# Patient Record
Sex: Female | Born: 1960 | Race: White | Hispanic: No | Marital: Single | Smoking: Never smoker
Health system: Southern US, Community
[De-identification: ages and names within clinical notes are randomized; demographics above are authoritative.]

## PROBLEM LIST (undated history)

## (undated) ENCOUNTER — Emergency Department (HOSPITAL_COMMUNITY): Admission: EM | Discharge status: Absent without leave | Payer: BLUE CROSS/BLUE SHIELD

## (undated) DIAGNOSIS — E119 Type 2 diabetes mellitus without complications: Secondary | ICD-10-CM

## (undated) DIAGNOSIS — E7800 Pure hypercholesterolemia, unspecified: Secondary | ICD-10-CM

## (undated) DIAGNOSIS — R0602 Shortness of breath: Secondary | ICD-10-CM

## (undated) DIAGNOSIS — I10 Essential (primary) hypertension: Secondary | ICD-10-CM

## (undated) DIAGNOSIS — E669 Obesity, unspecified: Secondary | ICD-10-CM

## (undated) DIAGNOSIS — R51 Headache: Secondary | ICD-10-CM

## (undated) DIAGNOSIS — I499 Cardiac arrhythmia, unspecified: Secondary | ICD-10-CM

## (undated) DIAGNOSIS — E1169 Type 2 diabetes mellitus with other specified complication: Secondary | ICD-10-CM

## (undated) DIAGNOSIS — I209 Angina pectoris, unspecified: Secondary | ICD-10-CM

## (undated) DIAGNOSIS — K219 Gastro-esophageal reflux disease without esophagitis: Secondary | ICD-10-CM

## (undated) DIAGNOSIS — I809 Phlebitis and thrombophlebitis of unspecified site: Secondary | ICD-10-CM

## (undated) HISTORY — PX: SALPINGOOPHORECTOMY: SHX82

## (undated) HISTORY — PX: SALPINGECTOMY: SHX328

## (undated) HISTORY — PX: TOENAIL EXCISION: SHX183

---

## 1997-09-10 ENCOUNTER — Emergency Department (HOSPITAL_COMMUNITY): Admission: EM | Admit: 1997-09-10 | Discharge: 1997-09-10 | Payer: Self-pay | Admitting: Emergency Medicine

## 1997-10-23 ENCOUNTER — Other Ambulatory Visit: Admission: RE | Admit: 1997-10-23 | Discharge: 1997-10-23 | Payer: Self-pay | Admitting: Obstetrics and Gynecology

## 1997-10-30 ENCOUNTER — Ambulatory Visit (HOSPITAL_COMMUNITY): Admission: RE | Admit: 1997-10-30 | Discharge: 1997-10-30 | Payer: Self-pay | Admitting: Obstetrics and Gynecology

## 1999-04-22 ENCOUNTER — Other Ambulatory Visit: Admission: RE | Admit: 1999-04-22 | Discharge: 1999-04-22 | Payer: Self-pay | Admitting: Obstetrics and Gynecology

## 1999-10-27 ENCOUNTER — Emergency Department (HOSPITAL_COMMUNITY): Admission: EM | Admit: 1999-10-27 | Discharge: 1999-10-27 | Payer: Self-pay | Admitting: Emergency Medicine

## 2000-07-06 ENCOUNTER — Other Ambulatory Visit: Admission: RE | Admit: 2000-07-06 | Discharge: 2000-07-06 | Payer: Self-pay | Admitting: Obstetrics and Gynecology

## 2001-03-22 ENCOUNTER — Emergency Department (HOSPITAL_COMMUNITY): Admission: EM | Admit: 2001-03-22 | Discharge: 2001-03-22 | Payer: Self-pay | Admitting: Emergency Medicine

## 2001-03-22 ENCOUNTER — Encounter: Payer: Self-pay | Admitting: Emergency Medicine

## 2001-03-23 ENCOUNTER — Emergency Department (HOSPITAL_COMMUNITY): Admission: EM | Admit: 2001-03-23 | Discharge: 2001-03-23 | Payer: Self-pay | Admitting: Emergency Medicine

## 2001-03-26 ENCOUNTER — Emergency Department (HOSPITAL_COMMUNITY): Admission: EM | Admit: 2001-03-26 | Discharge: 2001-03-26 | Payer: Self-pay | Admitting: Emergency Medicine

## 2001-04-09 ENCOUNTER — Emergency Department (HOSPITAL_COMMUNITY): Admission: EM | Admit: 2001-04-09 | Discharge: 2001-04-09 | Payer: Self-pay | Admitting: Emergency Medicine

## 2001-04-09 ENCOUNTER — Encounter: Payer: Self-pay | Admitting: Emergency Medicine

## 2002-04-11 ENCOUNTER — Emergency Department (HOSPITAL_COMMUNITY): Admission: EM | Admit: 2002-04-11 | Discharge: 2002-04-11 | Payer: Self-pay

## 2002-06-16 ENCOUNTER — Encounter: Payer: Self-pay | Admitting: Obstetrics and Gynecology

## 2002-06-16 ENCOUNTER — Encounter: Admission: RE | Admit: 2002-06-16 | Discharge: 2002-06-16 | Payer: Self-pay | Admitting: Obstetrics and Gynecology

## 2002-06-20 ENCOUNTER — Other Ambulatory Visit: Admission: RE | Admit: 2002-06-20 | Discharge: 2002-06-20 | Payer: Self-pay | Admitting: Obstetrics and Gynecology

## 2002-06-28 ENCOUNTER — Ambulatory Visit (HOSPITAL_COMMUNITY): Admission: RE | Admit: 2002-06-28 | Discharge: 2002-06-28 | Payer: Self-pay | Admitting: Obstetrics and Gynecology

## 2002-06-28 ENCOUNTER — Encounter: Payer: Self-pay | Admitting: Obstetrics and Gynecology

## 2004-12-26 ENCOUNTER — Encounter: Admission: RE | Admit: 2004-12-26 | Discharge: 2004-12-26 | Payer: Self-pay | Admitting: Obstetrics and Gynecology

## 2005-05-23 ENCOUNTER — Emergency Department (HOSPITAL_COMMUNITY): Admission: EM | Admit: 2005-05-23 | Discharge: 2005-05-23 | Payer: Self-pay | Admitting: Emergency Medicine

## 2005-05-28 ENCOUNTER — Emergency Department (HOSPITAL_COMMUNITY): Admission: EM | Admit: 2005-05-28 | Discharge: 2005-05-28 | Payer: Self-pay | Admitting: Emergency Medicine

## 2006-05-12 ENCOUNTER — Emergency Department (HOSPITAL_COMMUNITY): Admission: EM | Admit: 2006-05-12 | Discharge: 2006-05-12 | Payer: Self-pay | Admitting: Emergency Medicine

## 2006-10-04 ENCOUNTER — Emergency Department (HOSPITAL_COMMUNITY): Admission: EM | Admit: 2006-10-04 | Discharge: 2006-10-04 | Payer: Self-pay | Admitting: Emergency Medicine

## 2007-09-15 ENCOUNTER — Encounter (INDEPENDENT_AMBULATORY_CARE_PROVIDER_SITE_OTHER): Payer: Self-pay | Admitting: Obstetrics and Gynecology

## 2007-09-15 ENCOUNTER — Ambulatory Visit (HOSPITAL_BASED_OUTPATIENT_CLINIC_OR_DEPARTMENT_OTHER): Admission: RE | Admit: 2007-09-15 | Discharge: 2007-09-15 | Payer: Self-pay | Admitting: Obstetrics and Gynecology

## 2008-11-13 ENCOUNTER — Emergency Department (HOSPITAL_COMMUNITY): Admission: EM | Admit: 2008-11-13 | Discharge: 2008-11-13 | Payer: Self-pay | Admitting: Family Medicine

## 2008-11-22 ENCOUNTER — Emergency Department (HOSPITAL_COMMUNITY): Admission: EM | Admit: 2008-11-22 | Discharge: 2008-11-22 | Payer: Self-pay | Admitting: Family Medicine

## 2009-04-05 ENCOUNTER — Encounter: Admission: RE | Admit: 2009-04-05 | Discharge: 2009-04-05 | Payer: Self-pay | Admitting: Internal Medicine

## 2009-12-11 ENCOUNTER — Emergency Department (HOSPITAL_COMMUNITY): Admission: EM | Admit: 2009-12-11 | Discharge: 2009-12-11 | Payer: Self-pay | Admitting: Emergency Medicine

## 2010-07-21 ENCOUNTER — Inpatient Hospital Stay (HOSPITAL_COMMUNITY)
Admission: EM | Admit: 2010-07-21 | Discharge: 2010-07-22 | DRG: 813 | Disposition: A | Discharge status: Home / Self Care | Payer: BC Managed Care – PPO | Source: Other Acute Inpatient Hospital | Attending: Internal Medicine | Admitting: Internal Medicine

## 2010-07-21 ENCOUNTER — Inpatient Hospital Stay (HOSPITAL_COMMUNITY): Payer: BC Managed Care – PPO

## 2010-07-21 ENCOUNTER — Emergency Department (HOSPITAL_BASED_OUTPATIENT_CLINIC_OR_DEPARTMENT_OTHER)
Admission: EM | Admit: 2010-07-21 | Discharge: 2010-07-21 | Disposition: A | Discharge status: Nursing Home (Includes ICF) | Payer: BC Managed Care – PPO | Source: Home / Self Care | Attending: Emergency Medicine | Admitting: Emergency Medicine

## 2010-07-21 ENCOUNTER — Emergency Department (INDEPENDENT_AMBULATORY_CARE_PROVIDER_SITE_OTHER): Payer: BC Managed Care – PPO

## 2010-07-21 DIAGNOSIS — R000 Tachycardia, unspecified: Secondary | ICD-10-CM | POA: Insufficient documentation

## 2010-07-21 DIAGNOSIS — R002 Palpitations: Secondary | ICD-10-CM | POA: Insufficient documentation

## 2010-07-21 DIAGNOSIS — R109 Unspecified abdominal pain: Secondary | ICD-10-CM

## 2010-07-21 DIAGNOSIS — R10819 Abdominal tenderness, unspecified site: Secondary | ICD-10-CM | POA: Insufficient documentation

## 2010-07-21 DIAGNOSIS — D72829 Elevated white blood cell count, unspecified: Secondary | ICD-10-CM | POA: Insufficient documentation

## 2010-07-21 DIAGNOSIS — R630 Anorexia: Secondary | ICD-10-CM | POA: Insufficient documentation

## 2010-07-21 DIAGNOSIS — Z79899 Other long term (current) drug therapy: Secondary | ICD-10-CM | POA: Insufficient documentation

## 2010-07-21 DIAGNOSIS — A088 Other specified intestinal infections: Principal | ICD-10-CM | POA: Diagnosis present

## 2010-07-21 DIAGNOSIS — E872 Acidosis, unspecified: Secondary | ICD-10-CM | POA: Diagnosis present

## 2010-07-21 DIAGNOSIS — E119 Type 2 diabetes mellitus without complications: Secondary | ICD-10-CM | POA: Insufficient documentation

## 2010-07-21 DIAGNOSIS — Z7982 Long term (current) use of aspirin: Secondary | ICD-10-CM

## 2010-07-21 DIAGNOSIS — R197 Diarrhea, unspecified: Secondary | ICD-10-CM | POA: Insufficient documentation

## 2010-07-21 DIAGNOSIS — A059 Bacterial foodborne intoxication, unspecified: Secondary | ICD-10-CM | POA: Diagnosis present

## 2010-07-21 DIAGNOSIS — E785 Hyperlipidemia, unspecified: Secondary | ICD-10-CM | POA: Diagnosis present

## 2010-07-21 DIAGNOSIS — K920 Hematemesis: Secondary | ICD-10-CM | POA: Insufficient documentation

## 2010-07-21 DIAGNOSIS — R1110 Vomiting, unspecified: Secondary | ICD-10-CM

## 2010-07-21 LAB — URINALYSIS, ROUTINE W REFLEX MICROSCOPIC
Bilirubin Urine: NEGATIVE
Glucose, UA: >1000 mg/dL — AB
Hgb urine dipstick: NEGATIVE
Ketones, ur: 15 mg/dL — AB
Leukocytes, UA: NEGATIVE
Nitrite: NEGATIVE
Protein, ur: NEGATIVE mg/dL
Specific Gravity, Urine: 1.045 — ABNORMAL HIGH (ref 1.005–1.030)
Urobilinogen, UA: 0.2 mg/dL (ref 0.0–1.0)
pH: 5 (ref 5.0–8.0)

## 2010-07-21 LAB — URINE MICROSCOPIC-ADD ON

## 2010-07-21 LAB — GLUCOSE, CAPILLARY
Glucose-Capillary: 228 mg/dL — ABNORMAL HIGH (ref 70–99)
Glucose-Capillary: 209 mg/dL — ABNORMAL HIGH (ref 70–99)
Glucose-Capillary: 150 mg/dL — ABNORMAL HIGH (ref 70–99)
Glucose-Capillary: 152 mg/dL — ABNORMAL HIGH (ref 70–99)

## 2010-07-21 LAB — CBC
HCT: 37.7 % (ref 36.0–46.0)
Hemoglobin: 13.7 g/dL (ref 12.0–15.0)
MCH: 30.2 pg (ref 26.0–34.0)
MCHC: 36.3 g/dL — ABNORMAL HIGH (ref 30.0–36.0)
MCV: 83 fL (ref 78.0–100.0)
Platelets: 355 10*3/uL (ref 150–400)
RBC: 4.54 MIL/uL (ref 3.87–5.11)
RDW: 11.9 % (ref 11.5–15.5)
WBC: 21.5 10*3/uL — ABNORMAL HIGH (ref 4.0–10.5)

## 2010-07-21 LAB — LACTIC ACID, PLASMA: Lactic Acid, Venous: 2.7 mmol/L — ABNORMAL HIGH (ref 0.5–2.2)

## 2010-07-21 LAB — COMPREHENSIVE METABOLIC PANEL
ALT: 62 U/L — ABNORMAL HIGH (ref 0–35)
AST: 47 U/L — ABNORMAL HIGH (ref 0–37)
Albumin: 4.4 g/dL (ref 3.5–5.2)
Alkaline Phosphatase: 157 U/L — ABNORMAL HIGH (ref 39–117)
BUN: 16 mg/dL (ref 6–23)
CO2: 22 mEq/L (ref 19–32)
Calcium: 9.7 mg/dL (ref 8.4–10.5)
Chloride: 102 mEq/L (ref 96–112)
Creatinine, Ser: 0.6 mg/dL (ref 0.4–1.2)
GFR calc Af Amer: >60 mL/min (ref >60)
GFR calc non Af Amer: >60 mL/min (ref >60)
Glucose, Bld: 255 mg/dL — ABNORMAL HIGH (ref 70–99)
Potassium: 4.2 mEq/L (ref 3.5–5.1)
Sodium: 140 mEq/L (ref 135–145)
Total Bilirubin: 0.7 mg/dL (ref 0.3–1.2)
Total Protein: 8 g/dL (ref 6.0–8.3)

## 2010-07-21 LAB — DIFFERENTIAL
Basophils Absolute: 0 10*3/uL (ref 0.0–0.1)
Basophils Relative: 0 % (ref 0–1)
Eosinophils Absolute: 0.1 10*3/uL (ref 0.0–0.7)
Eosinophils Relative: 0 % (ref 0–5)
Lymphocytes Relative: 17 % (ref 12–46)
Lymphs Abs: 3.7 10*3/uL (ref 0.7–4.0)
Monocytes Absolute: 1.1 10*3/uL — ABNORMAL HIGH (ref 0.1–1.0)
Monocytes Relative: 5 % (ref 3–12)
Neutro Abs: 16.5 10*3/uL — ABNORMAL HIGH (ref 1.7–7.7)
Neutrophils Relative %: 77 % (ref 43–77)

## 2010-07-21 LAB — LIPASE, BLOOD: Lipase: 81 U/L (ref 23–300)

## 2010-07-21 LAB — RAPID STREP SCREEN (MED CTR MEBANE ONLY): Streptococcus, Group A Screen (Direct): NEGATIVE

## 2010-07-21 LAB — MRSA PCR SCREENING: MRSA by PCR: NEGATIVE

## 2010-07-21 MED ORDER — IOHEXOL 300 MG/ML  SOLN
100.0000 mL | Freq: Once | INTRAMUSCULAR | Status: AC | PRN
  Administered 2010-07-21: 100 mL via INTRAVENOUS

## 2010-07-22 LAB — GLUCOSE, CAPILLARY
Glucose-Capillary: 154 mg/dL — ABNORMAL HIGH (ref 70–99)
Glucose-Capillary: 168 mg/dL — ABNORMAL HIGH (ref 70–99)
Glucose-Capillary: 199 mg/dL — ABNORMAL HIGH (ref 70–99)

## 2010-07-22 LAB — CBC
HCT: 35.5 % — ABNORMAL LOW (ref 36.0–46.0)
Hemoglobin: 12.2 g/dL (ref 12.0–15.0)
MCH: 29.9 pg (ref 26.0–34.0)
MCHC: 34.4 g/dL (ref 30.0–36.0)
MCV: 87 fL (ref 78.0–100.0)
Platelets: 254 10*3/uL (ref 150–400)
RBC: 4.08 MIL/uL (ref 3.87–5.11)
RDW: 12.1 % (ref 11.5–15.5)
WBC: 7.5 10*3/uL (ref 4.0–10.5)

## 2010-07-22 LAB — COMPREHENSIVE METABOLIC PANEL
ALT: 57 U/L — ABNORMAL HIGH (ref 0–35)
AST: 57 U/L — ABNORMAL HIGH (ref 0–37)
Albumin: 3 g/dL — ABNORMAL LOW (ref 3.5–5.2)
Alkaline Phosphatase: 96 U/L (ref 39–117)
BUN: 6 mg/dL (ref 6–23)
CO2: 25 mEq/L (ref 19–32)
Calcium: 8.2 mg/dL — ABNORMAL LOW (ref 8.4–10.5)
Chloride: 106 mEq/L (ref 96–112)
Creatinine, Ser: 0.7 mg/dL (ref 0.4–1.2)
GFR calc Af Amer: >60 mL/min (ref >60)
GFR calc non Af Amer: >60 mL/min (ref >60)
Glucose, Bld: 161 mg/dL — ABNORMAL HIGH (ref 70–99)
Potassium: 3.6 mEq/L (ref 3.5–5.1)
Sodium: 140 mEq/L (ref 135–145)
Total Bilirubin: 0.5 mg/dL (ref 0.3–1.2)
Total Protein: 6.2 g/dL (ref 6.0–8.3)

## 2010-07-22 LAB — LIPID PANEL
Cholesterol: 179 mg/dL (ref 0–200)
HDL: 37 mg/dL — ABNORMAL LOW (ref >39)
LDL Cholesterol: 73 mg/dL (ref 0–99)
Total CHOL/HDL Ratio: 4.8 RATIO
Triglycerides: 347 mg/dL — ABNORMAL HIGH (ref <150)
VLDL: 69 mg/dL — ABNORMAL HIGH (ref 0–40)

## 2010-07-22 LAB — HEMOGLOBIN A1C
Hgb A1c MFr Bld: 8.4 % — ABNORMAL HIGH (ref <5.7)
Mean Plasma Glucose: 194 mg/dL — ABNORMAL HIGH (ref <117)

## 2010-07-22 LAB — TSH: TSH: 2.412 u[IU]/mL (ref 0.350–4.500)

## 2010-07-22 LAB — LACTIC ACID, PLASMA: Lactic Acid, Venous: 3.1 mmol/L — ABNORMAL HIGH (ref 0.5–2.2)

## 2010-07-25 NOTE — H&P (Signed)
NAMECHENEL, Elizabeth                ACCOUNT NO.:  [id number]  MEDICAL RECORD NO.:  [id number]           PATIENT TYPE:  LOCATION:                                 FACILITY:  PHYSICIAN:  Conley Canal, MD      DATE OF BIRTH:  03/08/1961  DATE OF ADMISSION: DATE OF DISCHARGE:                             HISTORY & PHYSICAL   PRIMARY CARE PHYSICIAN:  Dr. Sharl Ma in [facility].  CHIEF COMPLAINT:  Nausea, vomiting, diarrhea x2 days.  HISTORY OF PRESENT ILLNESS:  Elizabeth Huber is a 50 year old female, morbidly obese, diabetic, with hyperlipidemia, who came in with complaints of nausea, vomiting, and diarrhea associated with some abdominal pain, ongoing since yesterday evening.  The patient states she started feeling unwell around 10:00 p.m. last night.  She felt nauseated and also hot.  She says that she went out to eat at [facility] where she had lunch.  Since 10:00 p.m. last night, she has vomited at least 5 times and she says that vomitus is mucoid with a tinge of blood and she has not seen what kind of diarrhea it.  She mentions abdominal pain which is diffuse and admits to subjective fever.  No urinary symptoms.  No cough. Upon presentation to the emergency room in [facility], the patient was found to be tachycardic, heart rate around 115 and she was also acidotic with lactic acid level 2.7.  White count 21,000.  Lipase was negative, however her imaging studies so far including CT abdomen and pelvis and chest x-ray were negative.  She states that she feels somewhat better. She has received some IV fluids as well as GI cocktail and morphine sulfate.  PAST MEDICAL HISTORY: 1. Morbid obesity. 2. Hyperlipidemia. 3. Diabetes mellitus type 2.  ALLERGIES:  VICODIN.  SOCIAL HISTORY:  The patient lives with her parents.  She denies cigarette smoking, alcohol, or illicit drugs.  FAMILY HISTORY:  Positive for diabetes mellitus, hypertension, hyperlipidemia in the patient's mother.  HOME  MEDICATIONS:  Aspirin, metformin.  REVIEW OF SYSTEMS:  Unremarkable except as highlighted in the history of present illness.  PHYSICAL EXAMINATION:  GENERAL:  On examination, this is a morbidly obese young lady, not in acute distress. VITAL SIGNS:  Blood pressure 130 systolic, heart rate is in the 90s. She is febrile, oxygenating adequately, respiratory rate around 16. HEAD, EARS, NOSE and THROAT:  Dry oral mucosa.  Pupils equal, reacting to light.  No jugular venous distention. RESPIRATORY:  Good air entry bilaterally with no rhonchi, rales, or wheezes. CARDIOVASCULAR:  First and second heart sounds heard.  No murmurs. Pulse regular. ABDOMEN: Soft, nontender.  No palpable organomegaly.  Bowel sounds are normal. CNS:  The patient is alert, oriented in person, place and time with no acute focal neurological deficits. EXTREMITIES:  No pedal edema.  Peripheral pulses equal.  LABORATORY DATA:  WBC 21.5, hemoglobin 13.7, hematocrit 37.7, platelet count 355.  Sodium 140, potassium 4.2, BUN 16, creatinine 0.6, bicarbonate 22, glucose 255, alkaline phosphatase 157, AST 47, ALT 62, lipase 81.  Urinalysis shows specific gravity of 1.045, glucose greater than 1000, lactic acid 2.7.  Imaging studies were discussed above.  IMPRESSION:  A 50 year old diabetic, morbidly obese female, who is presenting with nausea, vomiting, and diarrhea, most likely secondary to acute gastroenteritis, viral versus bacterial.  She could have picked something from the food she ate at the restaurant yesterday, also possibility of norovirus which has had an outbreak in the [place] lately. The patient has transaminitis the etiology of which is not clear.  It could be secondary to hypotension and also possibility of acute cholecystitis which is not supported by the CT abdomen findings.  PLAN: 1. Nausea, vomiting, diarrhea.  Acute gastroenteritis versus other     possibilities.  We will admit the patient to regular  Medicine,     contact isolation, obtain stool studies, gently rehydrate.     Meanwhile, we will cover with ciprofloxacin and Flagyl. 2. Transaminitis.  We will obtain hepatitis panel as well as     ultrasound of the right upper quadrant.  Most likely, this is     related to ongoing acute gastroenteritis and should improve with     resolution of the acute gastroenteritis. 3. High blood pressure.  We will place the patient on low-dose     angiotensin-converting enzyme inhibitor. 4. Diabetes mellitus type 2.  We will hold metformin in view of     ongoing lactic acidosis.  Gently rehydrate the patient, place on     Lantus and aspart sliding scale. 5. Deep vein thrombosis prophylaxis, Lovenox. 6. GI prophylaxis, PPI. 7. The patient's condition is guarded.     Conley Canal, MD     SR/MEDQ  D:  07/21/2010  T:  07/21/2010  Job:  [id number]  cc:   Dr. Sharl Ma  Electronically Signed by Conley Canal  on 07/24/2010 07:18:25 PM

## 2010-08-01 NOTE — Discharge Summary (Signed)
NAMEJOEANN, Elizabeth Huber                ACCOUNT NO.:  [id number]  MEDICAL RECORD NO.:  [id number]           PATIENT TYPE:  I  LOCATION:  4501                         FACILITY:  [facility]  PHYSICIAN:  Peggye Pitt, M.D. DATE OF BIRTH:  06/29/1960  DATE OF ADMISSION:  07/21/2010 DATE OF DISCHARGE:  07/22/2010                              DISCHARGE SUMMARY   PRIMARY CARE PHYSICIAN:  Tonita Cong, MD  DISCHARGE DIAGNOSES: 1. Nausea, vomiting, and diarrhea, resolved, presumed secondary to     acute viral gastroenteritis versus food poisoning. 2. Mild lactic acidosis, off metformin. 3. Leukocytosis, presumed secondary to #1, resolved. 4. Morbid obesity. 5. Hyperlipidemia. 6. Type 2 diabetes mellitus.  DISCHARGE MEDICATIONS: 1. Glipizide 10 mg daily. 2. Aspirin 81 mg daily. 3. Multivitamin 1 tablet daily. 4. She has been instructed to discontinue the use of her metformin.  DISPOSITION AND FOLLOWUP:  Elizabeth Huber will be discharged home today in stable and improved condition.  She has been instructed to secure followup appointment with Dr. Sharl Ma in approximately 2-3 weeks.  At that time, I would recommend that we recheck her lactic acid to make sure that it has resolved as well as make sure that she is under good glycemic control.  CONSULTATION THIS HOSPITALIZATION:  None.  IMAGES AND PROCEDURES: 1. A chest x-ray on March 4 that showed no acute cardiopulmonary     findings. 2. CT scan of the abdomen and pelvis on July 21, 2010, that showed no     acute abdominal or pelvic findings, mass, lesions, or adenopathy. 3. An abdominal ultrasound on July 21, 2010, that showed diffuse fatty     infiltration of the liver and some gallbladder sludge.  HISTORY AND PHYSICAL:  For details, please see dictation by Dr. Venetia Constable on July 21, 2010, but in brief, Elizabeth Huber is a very pleasant 50 year old obese Caucasian lady with a history of hyperlipidemia and type 2 diabetes, who came into the  hospital with complaints of nausea, vomiting, diarrhea that began approximately 6 hours after eating at [facility].  She stated that ever since that meal, her stomach was feeling "funny."  Upon arrival to her house after work, she vomited about 5 times and had about 3 episodes of diarrhea.  She asker her father bring her into the hospital for further evaluation where she was found to have a lactic acid of 2.7 and a white count of 21,000.  Because of this, we were asked to admit for further evaluation.  HOSPITAL COURSE BY PROBLEM: 1. Nausea, vomiting, and diarrhea.  This has self resolved while in     the hospital without any interventions.  I believe at this point     that this was most likely secondary to either acute viral     gastroenteritis versus food poisoning.  Her white count has     returned to a normal of 7.5 on the day of discharge, again without     any interventions.  She continues to have episodes of nausea and     vomiting, then further attention needs to be paid to her  gallbladder.  Abdominal ultrasound this admission did show some     gallbladder sludge but no true cholelithiasis. 2. Lactic acidosis.  Lactic acidosis could certainly be secondary to     her viral gastroenteritis, however, I also believe that metformin     may be contributing and at this point, I will discontinue her     metformin indefinitely.  In exchange, I will start her on glipizide     for her sugars. 3. For her diabetes, as stated above, we had discontinued her     metformin for now and added a glipizide instead.  She has been     instructed to take her blood sugars at least once or twice a day     and bring her CBG log at her next appointment with Dr. Sharl Ma. 4. Rest of conditions have been stable.  VITALS ON THE DAY OF DISCHARGE:  Blood pressure 114/69, heart rate 85, respirations 20, sats of 94% on room air, and temp of 98.0.     Peggye Pitt, M.D.     EH/MEDQ  D:  07/22/2010  T:   07/23/2010  Job:  [id number]  cc:   Tonita Cong, M.D.  Electronically Signed by Peggye Pitt M.D. on 08/01/2010 05:42:09 PM

## 2010-08-03 LAB — POCT I-STAT, CHEM 8
BUN: 13 mg/dL (ref 6–23)
Calcium, Ion: 1.16 mmol/L (ref 1.12–1.32)
Chloride: 102 mEq/L (ref 96–112)
Creatinine, Ser: 0.6 mg/dL (ref 0.4–1.2)
Glucose, Bld: 187 mg/dL — ABNORMAL HIGH (ref 70–99)
HCT: 41 % (ref 36.0–46.0)
Hemoglobin: 13.9 g/dL (ref 12.0–15.0)
Potassium: 3.9 mEq/L (ref 3.5–5.1)
Sodium: 136 mEq/L (ref 135–145)
TCO2: 26 mmol/L (ref 0–100)

## 2010-08-03 LAB — DIFFERENTIAL
Basophils Absolute: 0 10*3/uL (ref 0.0–0.1)
Basophils Relative: 0 % (ref 0–1)
Eosinophils Absolute: 0.1 10*3/uL (ref 0.0–0.7)
Eosinophils Relative: 1 % (ref 0–5)
Lymphocytes Relative: 33 % (ref 12–46)
Lymphs Abs: 3.6 10*3/uL (ref 0.7–4.0)
Monocytes Absolute: 0.6 10*3/uL (ref 0.1–1.0)
Monocytes Relative: 6 % (ref 3–12)
Neutro Abs: 6.7 10*3/uL (ref 1.7–7.7)
Neutrophils Relative %: 61 % (ref 43–77)

## 2010-08-03 LAB — CBC
HCT: 38.1 % (ref 36.0–46.0)
Hemoglobin: 13.6 g/dL (ref 12.0–15.0)
MCH: 31.3 pg (ref 26.0–34.0)
MCHC: 35.5 g/dL (ref 30.0–36.0)
MCV: 88.2 fL (ref 78.0–100.0)
Platelets: 329 10*3/uL (ref 150–400)
RBC: 4.32 MIL/uL (ref 3.87–5.11)
RDW: 12 % (ref 11.5–15.5)
WBC: 11 10*3/uL — ABNORMAL HIGH (ref 4.0–10.5)

## 2010-08-03 LAB — PREGNANCY, URINE: Preg Test, Ur: NEGATIVE

## 2010-08-03 LAB — POCT CARDIAC MARKERS
CKMB, poc: <1 ng/mL — ABNORMAL LOW (ref 1.0–8.0)
Myoglobin, poc: 55.8 ng/mL (ref 12–200)
Troponin i, poc: <0.05 ng/mL (ref 0.00–0.09)

## 2010-08-03 LAB — GLUCOSE, CAPILLARY: Glucose-Capillary: 154 mg/dL — ABNORMAL HIGH (ref 70–99)

## 2010-08-26 LAB — CBC
HCT: 36.6 % (ref 36.0–46.0)
Hemoglobin: 12.9 g/dL (ref 12.0–15.0)
MCHC: 35.3 g/dL (ref 30.0–36.0)
MCV: 87.2 fL (ref 78.0–100.0)
Platelets: 267 10*3/uL (ref 150–400)
RBC: 4.2 MIL/uL (ref 3.87–5.11)
RDW: 12.9 % (ref 11.5–15.5)
WBC: 9.9 10*3/uL (ref 4.0–10.5)

## 2010-08-26 LAB — DIFFERENTIAL
Basophils Absolute: 0 10*3/uL (ref 0.0–0.1)
Basophils Relative: 0 % (ref 0–1)
Eosinophils Absolute: 0 10*3/uL (ref 0.0–0.7)
Eosinophils Relative: 0 % (ref 0–5)
Lymphocytes Relative: 51 % — ABNORMAL HIGH (ref 12–46)
Lymphs Abs: 5 10*3/uL — ABNORMAL HIGH (ref 0.7–4.0)
Monocytes Absolute: 1 10*3/uL (ref 0.1–1.0)
Monocytes Relative: 10 % (ref 3–12)
Neutro Abs: 3.9 10*3/uL (ref 1.7–7.7)
Neutrophils Relative %: 39 % — ABNORMAL LOW (ref 43–77)

## 2010-08-26 LAB — URINE CULTURE

## 2010-08-26 LAB — POCT I-STAT, CHEM 8
BUN: 4 mg/dL — ABNORMAL LOW (ref 6–23)
BUN: 7 mg/dL (ref 6–23)
Calcium, Ion: 1.13 mmol/L (ref 1.12–1.32)
Calcium, Ion: 1.16 mmol/L (ref 1.12–1.32)
Chloride: 100 mEq/L (ref 96–112)
Chloride: 102 mEq/L (ref 96–112)
Creatinine, Ser: 0.6 mg/dL (ref 0.4–1.2)
Creatinine, Ser: 0.7 mg/dL (ref 0.4–1.2)
Glucose, Bld: 251 mg/dL — ABNORMAL HIGH (ref 70–99)
Glucose, Bld: 349 mg/dL — ABNORMAL HIGH (ref 70–99)
HCT: 37 % (ref 36.0–46.0)
HCT: 38 % (ref 36.0–46.0)
Hemoglobin: 12.6 g/dL (ref 12.0–15.0)
Hemoglobin: 12.9 g/dL (ref 12.0–15.0)
Potassium: 3.2 mEq/L — ABNORMAL LOW (ref 3.5–5.1)
Potassium: 3.9 mEq/L (ref 3.5–5.1)
Sodium: 136 mEq/L (ref 135–145)
Sodium: 137 mEq/L (ref 135–145)
TCO2: 24 mmol/L (ref 0–100)
TCO2: 25 mmol/L (ref 0–100)

## 2010-08-26 LAB — PATHOLOGIST SMEAR REVIEW

## 2010-08-26 LAB — POCT URINALYSIS DIP (DEVICE)
Glucose, UA: 500 mg/dL — AB
Hgb urine dipstick: NEGATIVE
Ketones, ur: 15 mg/dL — AB
Nitrite: NEGATIVE
Protein, ur: 30 mg/dL — AB
Specific Gravity, Urine: <=1.005 (ref 1.005–1.030)
Urobilinogen, UA: 2 mg/dL — ABNORMAL HIGH (ref 0.0–1.0)
pH: 6 (ref 5.0–8.0)

## 2010-08-26 LAB — POCT PREGNANCY, URINE: Preg Test, Ur: NEGATIVE

## 2010-08-26 LAB — GLUCOSE, CAPILLARY: Glucose-Capillary: 257 mg/dL — ABNORMAL HIGH (ref 70–99)

## 2010-09-28 IMAGING — CR DG CHEST 2V
2 series · 2 of 2 positions shown · non-contrast
Comparison: 10/04/2006

CLINICAL DATA: Fever/cough

CHEST - 2 VIEW

[view not recorded (1 of 2)]
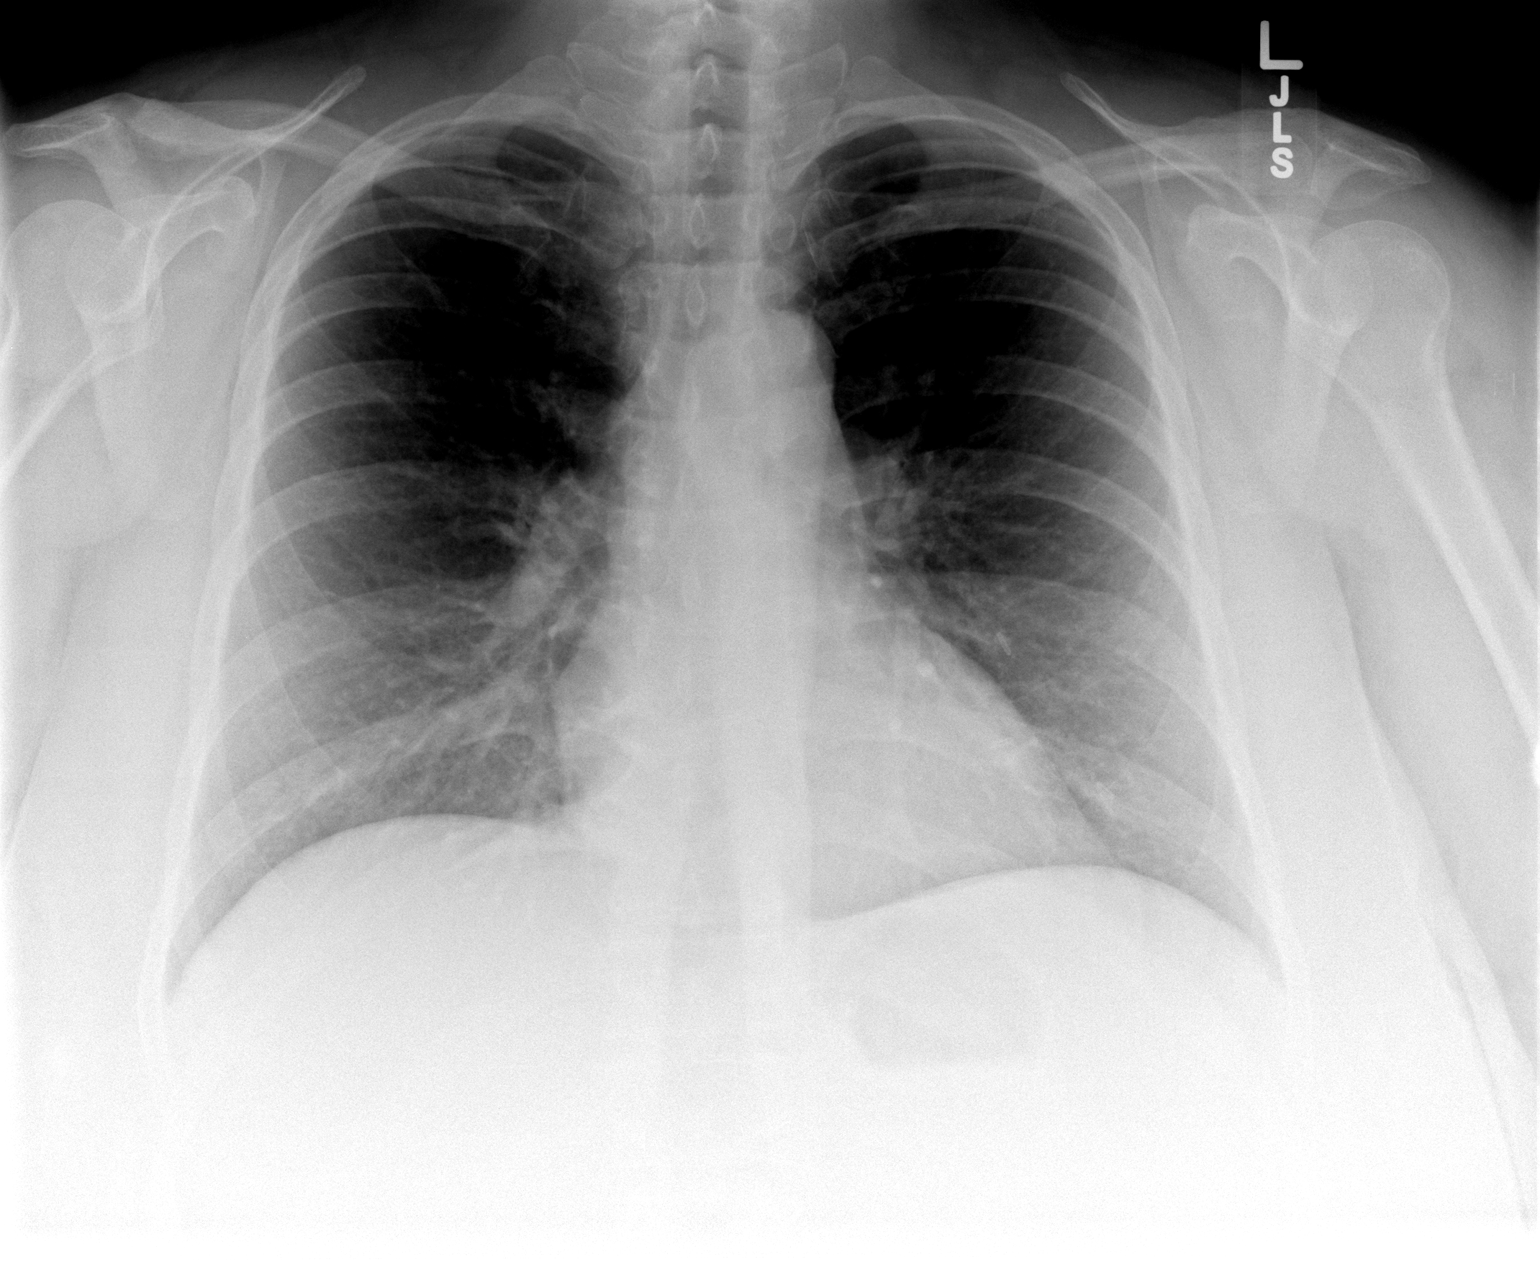

[view not recorded (2 of 2)]
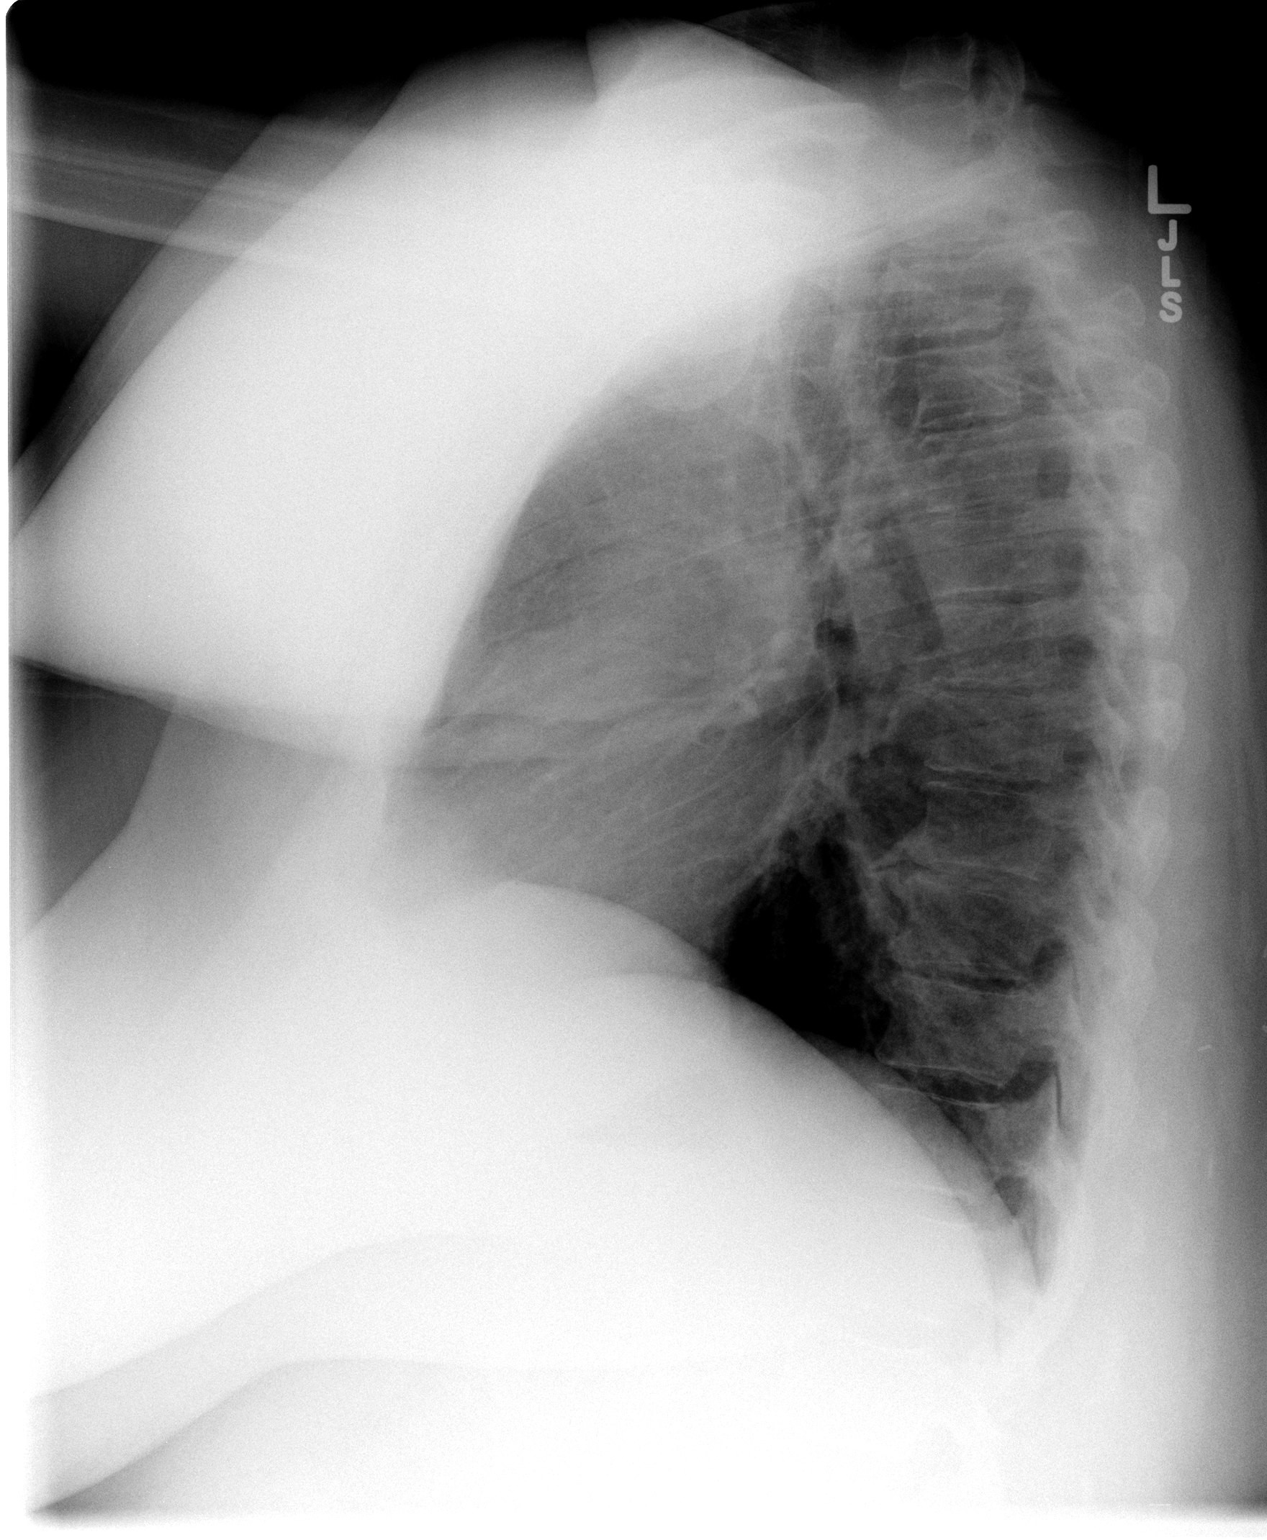

[2 of 2 positions shown; findings below may reference images not displayed]

FINDINGS: Heart and mediastinal contours normal.  Slight
peribronchial thickening.  Lungs clear.  No pleural fluid or
osseous lesions.
IMPRESSION: Mild peribronchial thickening - no active airspace disease.

## 2010-10-01 NOTE — Op Note (Signed)
NAMETIPHANIE, Elizabeth Huber                ACCOUNT NO.:  [id number]   MEDICAL RECORD NO.:  [id number]          PATIENT TYPE:  AMB   LOCATION:  [facility]                         FACILITY:  [facility]   PHYSICIAN:  Sherry A. Dickstein, M.D.DATE OF BIRTH:  26-Jan-1961   DATE OF PROCEDURE:  09/15/2007  DATE OF DISCHARGE:                               OPERATIVE REPORT   PREOPERATIVE DIAGNOSIS:  Menorrhagia with endometrial polyps.   POSTOPERATIVE DIAGNOSIS:  Menorrhagia with endometrial polyps.   PROCEDURE:  1. Dilatation and curettage.  2. Hysteroscopy with resectoscope.   SURGEON:  Sherry A. Rosalio Macadamia, M.D.   ANESTHESIA:  General.   INDICATIONS:  This is  50 year old G0, P0 woman who has been having  excessively heavy menstrual periods for which she underwent a workup.  Ultrasound revealed several endometrial polyps in the endometrial  cavity.  Therefore she is brought to the operating room for D and C,  hysteroscopy with resectoscope.   FINDINGS:  Normal size, anteflexed uterus.  No adnexal mass.  Endometrial polyps present.   PROCEDURE IN DETAIL:  The patient was brought into the operating room  and given adequate general anesthesia.  She was placed in the dorsal  lithotomy position.  Her perineum was washed with Betadine, as well as  her vagina.  She was draped in a sterile fashion.  Speculum was placed  within the vagina.  The vagina was rewashed with Betadine.  Paracervical  block was administered with 1% Nesacaine.  The anterior lip of the  cervix was grasped with a single-tooth tenaculum.  The cervix was  sounded.  The cervix was dilated with [facility] dilators, two #31.  The  hysteroscope was introduced in the endometrial cavity.  Pictures were  obtained.  The polyps were removed initially.  Then using the single  loop resector, sheets of endometrial tissue were resected  circumferentially  Adequate hemostasis was present.  Pictures were  obtained before and after the resections.  All  instruments were removed  from the vagina.  Adequate hemostasis was present.  The patient was  taken out of the dorsal lithotomy position.  She was awakened and  extubated.  She was moved from the operating table to a stretcher in  stable condition.   COMPLICATIONS:  None.   ESTIMATED BLOOD LOSS:  Less than 5 cc.   SPECIMENS:  1. Endometrial polyps.  2. Endometrial resections.      Sherry A. Rosalio Macadamia, M.D.  Electronically Signed     SAD/MEDQ  D:  09/15/2007  T:  09/15/2007  Job:  [id number]

## 2011-02-11 LAB — CBC
HCT: 38.5
Hemoglobin: 13.4
MCHC: 34.7
MCV: 87.9
Platelets: 407 — ABNORMAL HIGH
RBC: 4.39
RDW: 13.5
WBC: 10.6 — ABNORMAL HIGH

## 2011-02-11 LAB — DIFFERENTIAL
Basophils Absolute: 0
Basophils Relative: 0
Eosinophils Absolute: 0.1
Eosinophils Relative: 1
Lymphocytes Relative: 21
Lymphs Abs: 2.3
Monocytes Absolute: 0.5
Monocytes Relative: 5
Neutro Abs: 7.8 — ABNORMAL HIGH
Neutrophils Relative %: 73

## 2011-02-11 LAB — URINALYSIS, ROUTINE W REFLEX MICROSCOPIC
Bilirubin Urine: NEGATIVE
Glucose, UA: NEGATIVE
Hgb urine dipstick: NEGATIVE
Ketones, ur: NEGATIVE
Nitrite: NEGATIVE
Protein, ur: NEGATIVE
Specific Gravity, Urine: 1.021
Urobilinogen, UA: 0.2
pH: 5.5

## 2011-02-11 LAB — PREGNANCY, URINE: Preg Test, Ur: NEGATIVE

## 2011-07-08 ENCOUNTER — Other Ambulatory Visit: Payer: BC Managed Care – PPO | Admitting: Internal Medicine

## 2011-08-03 ENCOUNTER — Emergency Department (HOSPITAL_COMMUNITY)
Admission: EM | Admit: 2011-08-03 | Discharge: 2011-08-04 | Disposition: A | Discharge status: Home / Self Care | Payer: BC Managed Care – PPO | Attending: Emergency Medicine | Admitting: Emergency Medicine

## 2011-08-03 ENCOUNTER — Other Ambulatory Visit: Payer: Self-pay

## 2011-08-03 ENCOUNTER — Encounter (HOSPITAL_COMMUNITY): Payer: Self-pay | Admitting: *Deleted

## 2011-08-03 DIAGNOSIS — R05 Cough: Secondary | ICD-10-CM | POA: Insufficient documentation

## 2011-08-03 DIAGNOSIS — R059 Cough, unspecified: Secondary | ICD-10-CM | POA: Insufficient documentation

## 2011-08-03 DIAGNOSIS — Z794 Long term (current) use of insulin: Secondary | ICD-10-CM | POA: Insufficient documentation

## 2011-08-03 DIAGNOSIS — I499 Cardiac arrhythmia, unspecified: Secondary | ICD-10-CM

## 2011-08-03 DIAGNOSIS — R609 Edema, unspecified: Secondary | ICD-10-CM | POA: Insufficient documentation

## 2011-08-03 DIAGNOSIS — R079 Chest pain, unspecified: Secondary | ICD-10-CM | POA: Insufficient documentation

## 2011-08-03 DIAGNOSIS — E119 Type 2 diabetes mellitus without complications: Secondary | ICD-10-CM | POA: Insufficient documentation

## 2011-08-03 DIAGNOSIS — R350 Frequency of micturition: Secondary | ICD-10-CM | POA: Insufficient documentation

## 2011-08-03 HISTORY — DX: Cardiac arrhythmia, unspecified: I49.9

## 2011-08-03 LAB — DIFFERENTIAL
Basophils Absolute: 0 10*3/uL (ref 0.0–0.1)
Basophils Relative: 0 % (ref 0–1)
Eosinophils Absolute: 0.2 10*3/uL (ref 0.0–0.7)
Eosinophils Relative: 2 % (ref 0–5)
Lymphocytes Relative: 31 % (ref 12–46)
Lymphs Abs: 3.6 10*3/uL (ref 0.7–4.0)
Monocytes Absolute: 0.6 10*3/uL (ref 0.1–1.0)
Monocytes Relative: 5 % (ref 3–12)
Neutro Abs: 7 10*3/uL (ref 1.7–7.7)
Neutrophils Relative %: 61 % (ref 43–77)

## 2011-08-03 LAB — COMPREHENSIVE METABOLIC PANEL
ALT: 21 U/L (ref 0–35)
AST: 19 U/L (ref 0–37)
Albumin: 3.6 g/dL (ref 3.5–5.2)
Alkaline Phosphatase: 127 U/L — ABNORMAL HIGH (ref 39–117)
BUN: 15 mg/dL (ref 6–23)
CO2: 27 mEq/L (ref 19–32)
Calcium: 9.9 mg/dL (ref 8.4–10.5)
Chloride: 97 mEq/L (ref 96–112)
Creatinine, Ser: 0.79 mg/dL (ref 0.50–1.10)
GFR calc Af Amer: >90 mL/min (ref >90)
GFR calc non Af Amer: >90 mL/min (ref >90)
Glucose, Bld: 184 mg/dL — ABNORMAL HIGH (ref 70–99)
Potassium: 4 mEq/L (ref 3.5–5.1)
Sodium: 135 mEq/L (ref 135–145)
Total Bilirubin: 0.4 mg/dL (ref 0.3–1.2)
Total Protein: 7.9 g/dL (ref 6.0–8.3)

## 2011-08-03 LAB — CK TOTAL AND CKMB (NOT AT ARMC)
CK, MB: 1.9 ng/mL (ref 0.3–4.0)
Relative Index: INVALID (ref 0.0–2.5)
Total CK: 99 U/L (ref 7–177)

## 2011-08-03 LAB — CBC
HCT: 36.6 % (ref 36.0–46.0)
Hemoglobin: 13.1 g/dL (ref 12.0–15.0)
MCH: 29.6 pg (ref 26.0–34.0)
MCHC: 35.8 g/dL (ref 30.0–36.0)
MCV: 82.8 fL (ref 78.0–100.0)
Platelets: 360 10*3/uL (ref 150–400)
RBC: 4.42 MIL/uL (ref 3.87–5.11)
RDW: 12.3 % (ref 11.5–15.5)
WBC: 11.4 10*3/uL — ABNORMAL HIGH (ref 4.0–10.5)

## 2011-08-03 LAB — POCT I-STAT TROPONIN I: Troponin i, poc: 0 ng/mL (ref 0.00–0.08)

## 2011-08-03 NOTE — ED Notes (Signed)
The pt has had lt arm numbness and lt chest pain since yesterday with high bp.  She worked today.

## 2011-08-03 NOTE — ED Provider Notes (Signed)
History     CSN: [id number]  Arrival date & time 08/03/11  2145   First MD Initiated Contact with Patient 08/03/11 2259      Chief Complaint  Patient presents with  . Chest Pain    (Consider location/radiation/quality/duration/timing/severity/associated sxs/prior treatment) Patient is a 51 y.o. female presenting with chest pain. The history is provided by the patient.  Chest Pain The chest pain began 6 - 12 hours ago. Chest pain occurs frequently. The chest pain is unchanged. The severity of the pain is moderate. Exacerbated by: Nothing makes the chest tightness worse. Primary symptoms include cough. Pertinent negatives for primary symptoms include no fever, no shortness of breath, no abdominal pain and no nausea. Primary symptoms comment: She states the chest pain was there this morning when she got up and she has developed a cough throughtout the day.  Associated symptoms comments: She also reports her blood pressure was elevated when she checked it at work. She states she has been taking sinus medications that include decongestants..     Past Medical History  Diagnosis Date  . Diabetes mellitus     History reviewed. No pertinent past surgical history.  History reviewed. No pertinent family history.  History  Substance Use Topics  . Smoking status: Never Smoker   . Smokeless tobacco: Not on file  . Alcohol Use: No    OB History    Grav Para Term Preterm Abortions TAB SAB Ect Mult Living                  Review of Systems  Constitutional: Negative for fever and chills.  HENT: Positive for congestion and sinus pressure.   Eyes: Negative for discharge.  Respiratory: Positive for cough and chest tightness. Negative for shortness of breath.   Cardiovascular: Positive for chest pain.  Gastrointestinal: Negative.  Negative for nausea and abdominal pain.  Genitourinary: Positive for frequency.  Musculoskeletal: Negative.   Skin: Negative.  Negative for rash.    Neurological: Negative.     Allergies  Hydrocodone  Home Medications   Current Outpatient Rx  Name Route Sig Dispense Refill  . ASPIRIN EC 81 MG PO TBEC Oral Take 81 mg by mouth daily.    Marland Kitchen FEXOFENADINE HCL 180 MG PO TABS Oral Take 180 mg by mouth daily.    . IBUPROFEN 200 MG PO TABS Oral Take 400 mg by mouth every 6 (six) hours as needed. For pain.    . INSULIN GLARGINE 100 UNIT/ML [place] SOLN Subcutaneous Inject 80 Units into the skin daily.    . INSULIN GLULISINE 100 UNIT/ML [place] SOLN Subcutaneous Inject 20 Units into the skin 3 (three) times daily.    Marland Kitchen OVER THE COUNTER MEDICATION Oral Take 2 capsules by mouth every 4 (four) hours as needed. For sinus relief ( OTC Sinus Relief with phenylephrine and Guaifensin)      BP 191/98  Temp(Src) 97.6 F (36.4 C) (Oral)  Resp 20  SpO2 96%  LMP 05/05/2011  Physical Exam  Constitutional: She is oriented to person, place, and time. She appears well-developed and well-nourished.  HENT:  Head: Normocephalic.  Neck: Normal range of motion. Neck supple.  Cardiovascular: Normal rate and regular rhythm.   No murmur heard. Pulmonary/Chest: Effort normal and breath sounds normal. No respiratory distress. She has no wheezes. She has no rales.  Abdominal: Soft. Bowel sounds are normal. There is no tenderness. There is no rebound and no guarding.       Obese abdomen.  Musculoskeletal: Normal range of motion. She exhibits edema.  Neurological: She is alert and oriented to person, place, and time. No cranial nerve deficit.  Skin: Skin is warm and dry. No rash noted.  Psychiatric: She has a normal mood and affect.    ED Course  Procedures (including critical care time)  Labs Reviewed  CBC - Abnormal; Notable for the following:    WBC 11.4 (*)    All other components within normal limits  DIFFERENTIAL  POCT I-STAT TROPONIN I  CK TOTAL AND CKMB  COMPREHENSIVE METABOLIC PANEL   No results found.   No diagnosis found.    MDM  Patient  given conflicting histories and is felt to be an unreliable historian. Will get a second troponin at 3 hours and re-evaluate.        Rodena Medin, PA-C 08/04/11 0015

## 2011-08-04 ENCOUNTER — Emergency Department (HOSPITAL_COMMUNITY): Payer: BC Managed Care – PPO

## 2011-08-04 ENCOUNTER — Inpatient Hospital Stay (HOSPITAL_COMMUNITY)
Admission: EM | Admit: 2011-08-04 | Discharge: 2011-08-06 | DRG: 125 | Disposition: A | Discharge status: Home / Self Care | Payer: BC Managed Care – PPO | Attending: Internal Medicine | Admitting: Internal Medicine

## 2011-08-04 ENCOUNTER — Other Ambulatory Visit: Payer: Self-pay

## 2011-08-04 ENCOUNTER — Encounter (HOSPITAL_COMMUNITY): Payer: Self-pay | Admitting: *Deleted

## 2011-08-04 DIAGNOSIS — E669 Obesity, unspecified: Secondary | ICD-10-CM | POA: Diagnosis present

## 2011-08-04 DIAGNOSIS — R000 Tachycardia, unspecified: Secondary | ICD-10-CM | POA: Diagnosis present

## 2011-08-04 DIAGNOSIS — I471 Supraventricular tachycardia, unspecified: Secondary | ICD-10-CM | POA: Diagnosis not present

## 2011-08-04 DIAGNOSIS — I10 Essential (primary) hypertension: Secondary | ICD-10-CM | POA: Diagnosis present

## 2011-08-04 DIAGNOSIS — R079 Chest pain, unspecified: Secondary | ICD-10-CM | POA: Diagnosis present

## 2011-08-04 DIAGNOSIS — Z7982 Long term (current) use of aspirin: Secondary | ICD-10-CM

## 2011-08-04 DIAGNOSIS — Z794 Long term (current) use of insulin: Secondary | ICD-10-CM

## 2011-08-04 DIAGNOSIS — I498 Other specified cardiac arrhythmias: Secondary | ICD-10-CM | POA: Diagnosis present

## 2011-08-04 DIAGNOSIS — K219 Gastro-esophageal reflux disease without esophagitis: Secondary | ICD-10-CM | POA: Diagnosis present

## 2011-08-04 DIAGNOSIS — E119 Type 2 diabetes mellitus without complications: Secondary | ICD-10-CM | POA: Diagnosis present

## 2011-08-04 DIAGNOSIS — R002 Palpitations: Secondary | ICD-10-CM

## 2011-08-04 DIAGNOSIS — I2510 Atherosclerotic heart disease of native coronary artery without angina pectoris: Secondary | ICD-10-CM | POA: Diagnosis present

## 2011-08-04 DIAGNOSIS — Z79899 Other long term (current) drug therapy: Secondary | ICD-10-CM

## 2011-08-04 DIAGNOSIS — R0789 Other chest pain: Principal | ICD-10-CM | POA: Diagnosis present

## 2011-08-04 DIAGNOSIS — R51 Headache: Secondary | ICD-10-CM

## 2011-08-04 DIAGNOSIS — E785 Hyperlipidemia, unspecified: Secondary | ICD-10-CM | POA: Diagnosis present

## 2011-08-04 HISTORY — DX: Pure hypercholesterolemia, unspecified: E78.00

## 2011-08-04 HISTORY — DX: Shortness of breath: R06.02

## 2011-08-04 HISTORY — DX: Type 2 diabetes mellitus with other specified complication: E11.69

## 2011-08-04 HISTORY — DX: Angina pectoris, unspecified: I20.9

## 2011-08-04 HISTORY — DX: Type 2 diabetes mellitus with other specified complication: E66.9

## 2011-08-04 HISTORY — DX: Cardiac arrhythmia, unspecified: I49.9

## 2011-08-04 HISTORY — DX: Phlebitis and thrombophlebitis of unspecified site: I80.9

## 2011-08-04 HISTORY — DX: Gastro-esophageal reflux disease without esophagitis: K21.9

## 2011-08-04 HISTORY — DX: Headache: R51

## 2011-08-04 HISTORY — DX: Type 2 diabetes mellitus without complications: E11.9

## 2011-08-04 LAB — BASIC METABOLIC PANEL
BUN: 13 mg/dL (ref 6–23)
CO2: 25 mEq/L (ref 19–32)
Calcium: 9.5 mg/dL (ref 8.4–10.5)
Chloride: 100 mEq/L (ref 96–112)
Creatinine, Ser: 0.59 mg/dL (ref 0.50–1.10)
GFR calc Af Amer: >90 mL/min (ref >90)
GFR calc non Af Amer: >90 mL/min (ref >90)
Glucose, Bld: 136 mg/dL — ABNORMAL HIGH (ref 70–99)
Potassium: 4 mEq/L (ref 3.5–5.1)
Sodium: 137 mEq/L (ref 135–145)

## 2011-08-04 LAB — GLUCOSE, CAPILLARY
Glucose-Capillary: 125 mg/dL — ABNORMAL HIGH (ref 70–99)
Glucose-Capillary: 189 mg/dL — ABNORMAL HIGH (ref 70–99)
Glucose-Capillary: 135 mg/dL — ABNORMAL HIGH (ref 70–99)

## 2011-08-04 LAB — CBC
HCT: 36.9 % (ref 36.0–46.0)
HCT: 35.8 % — ABNORMAL LOW (ref 36.0–46.0)
Hemoglobin: 13.1 g/dL (ref 12.0–15.0)
Hemoglobin: 12 g/dL (ref 12.0–15.0)
MCH: 29.3 pg (ref 26.0–34.0)
MCH: 28.3 pg (ref 26.0–34.0)
MCHC: 35.5 g/dL (ref 30.0–36.0)
MCHC: 33.5 g/dL (ref 30.0–36.0)
MCV: 82.6 fL (ref 78.0–100.0)
MCV: 84.4 fL (ref 78.0–100.0)
Platelets: 347 10*3/uL (ref 150–400)
Platelets: 339 10*3/uL (ref 150–400)
RBC: 4.47 MIL/uL (ref 3.87–5.11)
RBC: 4.24 MIL/uL (ref 3.87–5.11)
RDW: 12.4 % (ref 11.5–15.5)
RDW: 12.7 % (ref 11.5–15.5)
WBC: 10.5 10*3/uL (ref 4.0–10.5)
WBC: 10.3 10*3/uL (ref 4.0–10.5)

## 2011-08-04 LAB — URINALYSIS, ROUTINE W REFLEX MICROSCOPIC
Bilirubin Urine: NEGATIVE
Glucose, UA: NEGATIVE mg/dL
Hgb urine dipstick: NEGATIVE
Ketones, ur: NEGATIVE mg/dL
Leukocytes, UA: NEGATIVE
Nitrite: NEGATIVE
Protein, ur: NEGATIVE mg/dL
Specific Gravity, Urine: 1.011 (ref 1.005–1.030)
Urobilinogen, UA: 0.2 mg/dL (ref 0.0–1.0)
pH: 5.5 (ref 5.0–8.0)

## 2011-08-04 LAB — POCT I-STAT TROPONIN I: Troponin i, poc: 0 ng/mL (ref 0.00–0.08)

## 2011-08-04 LAB — CARDIAC PANEL(CRET KIN+CKTOT+MB+TROPI)
CK, MB: 1.8 ng/mL (ref 0.3–4.0)
CK, MB: 1.8 ng/mL (ref 0.3–4.0)
Relative Index: INVALID (ref 0.0–2.5)
Relative Index: INVALID (ref 0.0–2.5)
Total CK: 85 U/L (ref 7–177)
Total CK: 91 U/L (ref 7–177)
Troponin I: <0.3 ng/mL (ref <0.30)
Troponin I: <0.3 ng/mL (ref <0.30)

## 2011-08-04 LAB — CREATININE, SERUM
Creatinine, Ser: 0.64 mg/dL (ref 0.50–1.10)
GFR calc Af Amer: >90 mL/min (ref >90)
GFR calc non Af Amer: >90 mL/min (ref >90)

## 2011-08-04 LAB — TROPONIN I: Troponin I: <0.3 ng/mL (ref <0.30)

## 2011-08-04 LAB — TSH: TSH: 2.091 u[IU]/mL (ref 0.350–4.500)

## 2011-08-04 LAB — AMYLASE: Amylase: 36 U/L (ref 0–105)

## 2011-08-04 LAB — LIPASE, BLOOD: Lipase: 16 U/L (ref 11–59)

## 2011-08-04 LAB — D-DIMER, QUANTITATIVE: D-Dimer, Quant: <0.22 ug/mL-FEU (ref 0.00–0.48)

## 2011-08-04 MED ORDER — HYDROMORPHONE HCL PF 1 MG/ML IJ SOLN
1.0000 mg | INTRAMUSCULAR | Status: DC | PRN
  Administered 2011-08-04 – 2011-08-05 (×2): 1 mg via INTRAVENOUS
  Filled 2011-08-04: qty 1

## 2011-08-04 MED ORDER — ONDANSETRON HCL 4 MG PO TABS: 4.0000 mg | ORAL_TABLET | Freq: Four times a day (QID) | ORAL | Status: DC | PRN

## 2011-08-04 MED ORDER — ACETAMINOPHEN 650 MG RE SUPP: 650.0000 mg | Freq: Four times a day (QID) | RECTAL | Status: DC | PRN

## 2011-08-04 MED ORDER — ASPIRIN 81 MG PO CHEW
324.0000 mg | CHEWABLE_TABLET | Freq: Once | ORAL | Status: AC
  Administered 2011-08-04: 324 mg via ORAL
  Filled 2011-08-04: qty 4

## 2011-08-04 MED ORDER — ENOXAPARIN SODIUM 40 MG/0.4ML ~~LOC~~ SOLN
40.0000 mg | SUBCUTANEOUS | Status: DC
  Filled 2011-08-04: qty 0.4

## 2011-08-04 MED ORDER — ACETAMINOPHEN 325 MG PO TABS
650.0000 mg | ORAL_TABLET | Freq: Four times a day (QID) | ORAL | Status: DC | PRN
  Administered 2011-08-04 – 2011-08-05 (×2): 650 mg via ORAL
  Filled 2011-08-04 (×2): qty 2

## 2011-08-04 MED ORDER — INSULIN ASPART 100 UNIT/ML ~~LOC~~ SOLN
20.0000 [IU] | Freq: Three times a day (TID) | SUBCUTANEOUS | Status: DC
  Administered 2011-08-04 – 2011-08-06 (×3): 20 [IU] via SUBCUTANEOUS

## 2011-08-04 MED ORDER — ENOXAPARIN SODIUM 40 MG/0.4ML ~~LOC~~ SOLN
40.0000 mg | SUBCUTANEOUS | Status: DC
  Administered 2011-08-04: 40 mg via SUBCUTANEOUS
  Filled 2011-08-04 (×2): qty 0.4

## 2011-08-04 MED ORDER — SODIUM CHLORIDE 0.9 % IV SOLN: 250.0000 mL | INTRAVENOUS | Status: DC | PRN

## 2011-08-04 MED ORDER — SODIUM CHLORIDE 0.9 % IJ SOLN: 3.0000 mL | Freq: Two times a day (BID) | INTRAMUSCULAR | Status: DC

## 2011-08-04 MED ORDER — ASPIRIN EC 81 MG PO TBEC
81.0000 mg | DELAYED_RELEASE_TABLET | Freq: Every day | ORAL | Status: DC
  Administered 2011-08-06: 81 mg via ORAL
  Filled 2011-08-04: qty 1

## 2011-08-04 MED ORDER — OXYCODONE-ACETAMINOPHEN 5-325 MG PO TABS
1.0000 | ORAL_TABLET | Freq: Once | ORAL | Status: AC
  Administered 2011-08-04: 1 via ORAL
  Filled 2011-08-04: qty 1

## 2011-08-04 MED ORDER — ASPIRIN EC 81 MG PO TBEC
81.0000 mg | DELAYED_RELEASE_TABLET | Freq: Every day | ORAL | Status: DC
  Administered 2011-08-04: 81 mg via ORAL
  Filled 2011-08-04: qty 1

## 2011-08-04 MED ORDER — INSULIN GLARGINE 100 UNIT/ML ~~LOC~~ SOLN
80.0000 [IU] | Freq: Every day | SUBCUTANEOUS | Status: DC
  Administered 2011-08-04 – 2011-08-06 (×2): 80 [IU] via SUBCUTANEOUS

## 2011-08-04 MED ORDER — METOPROLOL TARTRATE 25 MG PO TABS
25.0000 mg | ORAL_TABLET | Freq: Two times a day (BID) | ORAL | Status: DC
  Administered 2011-08-04 – 2011-08-06 (×4): 25 mg via ORAL
  Filled 2011-08-04 (×5): qty 1

## 2011-08-04 MED ORDER — ASPIRIN 81 MG PO CHEW
324.0000 mg | CHEWABLE_TABLET | ORAL | Status: AC
  Administered 2011-08-05: 324 mg via ORAL
  Filled 2011-08-04: qty 4

## 2011-08-04 MED ORDER — PANTOPRAZOLE SODIUM 40 MG PO TBEC
40.0000 mg | DELAYED_RELEASE_TABLET | Freq: Every day | ORAL | Status: DC
  Administered 2011-08-04 – 2011-08-06 (×3): 40 mg via ORAL
  Filled 2011-08-04 (×3): qty 1

## 2011-08-04 MED ORDER — ALUM & MAG HYDROXIDE-SIMETH 200-200-20 MG/5ML PO SUSP: 30.0000 mL | Freq: Four times a day (QID) | ORAL | Status: DC | PRN

## 2011-08-04 MED ORDER — SODIUM CHLORIDE 0.9 % IV SOLN
20.0000 mL | INTRAVENOUS | Status: DC
  Administered 2011-08-04: 20 mL via INTRAVENOUS

## 2011-08-04 MED ORDER — LORATADINE 10 MG PO TABS
10.0000 mg | ORAL_TABLET | Freq: Every day | ORAL | Status: DC
  Administered 2011-08-04 – 2011-08-06 (×3): 10 mg via ORAL
  Filled 2011-08-04 (×3): qty 1

## 2011-08-04 MED ORDER — DIAZEPAM 5 MG PO TABS
5.0000 mg | ORAL_TABLET | ORAL | Status: AC
  Administered 2011-08-05: 5 mg via ORAL
  Filled 2011-08-04: qty 1

## 2011-08-04 MED ORDER — METOPROLOL TARTRATE 25 MG PO TABS
12.5000 mg | ORAL_TABLET | Freq: Two times a day (BID) | ORAL | Status: DC
  Administered 2011-08-04: 12.5 mg via ORAL
  Filled 2011-08-04: qty 1

## 2011-08-04 MED ORDER — SODIUM CHLORIDE 0.9 % IJ SOLN: 3.0000 mL | INTRAMUSCULAR | Status: DC | PRN

## 2011-08-04 MED ORDER — SODIUM CHLORIDE 0.9 % IJ SOLN
3.0000 mL | Freq: Two times a day (BID) | INTRAMUSCULAR | Status: DC
  Administered 2011-08-04 – 2011-08-06 (×3): 3 mL via INTRAVENOUS

## 2011-08-04 MED ORDER — SODIUM CHLORIDE 0.9 % IV SOLN
20.0000 mL | INTRAVENOUS | Status: DC
  Administered 2011-08-04 (×3): 20 mL via INTRAVENOUS

## 2011-08-04 MED ORDER — ONDANSETRON HCL 4 MG/2ML IJ SOLN
INTRAMUSCULAR | Status: AC
  Administered 2011-08-04: 4 mg via INTRAVENOUS
  Filled 2011-08-04: qty 2

## 2011-08-04 MED ORDER — OXYCODONE-ACETAMINOPHEN 5-325 MG PO TABS
1.0000 | ORAL_TABLET | ORAL | Status: DC | PRN
  Administered 2011-08-04: 2 via ORAL
  Administered 2011-08-04: 1 via ORAL
  Filled 2011-08-04: qty 2
  Filled 2011-08-04: qty 1

## 2011-08-04 MED ORDER — INSULIN GLULISINE 100 UNIT/ML ~~LOC~~ SOLN: 20.0000 [IU] | Freq: Three times a day (TID) | SUBCUTANEOUS | Status: DC

## 2011-08-04 MED ORDER — SODIUM CHLORIDE 0.9 % IV SOLN
INTRAVENOUS | Status: DC
  Administered 2011-08-05: 01:00:00 via INTRAVENOUS

## 2011-08-04 MED ORDER — ONDANSETRON HCL 4 MG/2ML IJ SOLN
4.0000 mg | Freq: Four times a day (QID) | INTRAMUSCULAR | Status: DC | PRN
  Administered 2011-08-04: 4 mg via INTRAVENOUS

## 2011-08-04 NOTE — H&P (Signed)
History and Physical       Hospital Admission Note Date: 08/04/2011  Patient name: Elizabeth Huber Medical record number: [id number] Date of birth: Feb 20, 1961 Age: 51 y.o. Gender: female PCP: Lolita Patella, MD, MD  Attending physician: Cathren Harsh, MD   Chief Complaint:  Chest pain and palpitations  HPI: Patient is a 51 year old female with history of diabetes mellitus on insulin, hyperlipidemia, GERD presented to [facility] ED with chest pain and palpitations. Patient initially presented to the ED yesterday with similar symptoms of chest pain. She was observed in the ED and had negative troponins with no EKG changes, patient was discharged home. Patient stated that she had the chest pain since yesterday morning and had not gone away. Patient was able to go to sleep but then woke up with palpitations and chest pain. She describes the chest pain as midsternal, 9/10 intensity sharp intermittent. She also complains of pain in the jaw and shoulders. She denies any associated nausea or vomiting, diaphoresis. She has baseline shortness of breath on exertion, feels short of breath on taking 3 stairs. Also complaining of headache since morning. She denies any dizziness or lightheadedness or any focal weakness. She states that she was working in [employer] yesterday and noticed high BP in 190s. She denies any history of hypertension or taking any antihypertensives at home. Also states that her blood sugars has been uncontrolled and she is followed by Dr. Talmage Coin (her endocrinologist) and had recently increased her medications.     Review of Systems:  Constitutional: Denies fever, chills, diaphoresis, appetite change and fatigue.  HEENT: Denies photophobia, eye pain, redness, hearing loss, ear pain, congestion, sore throat, rhinorrhea, sneezing, mouth sores, trouble swallowing, neck pain, neck stiffness and tinnitus.   Respiratory: See  history of present illness  Cardiovascular: See history of present illness   Gastrointestinal: Denies nausea, vomiting, abdominal pain, diarrhea, constipation, blood in stool and abdominal distention.  Genitourinary: Denies dysuria, urgency, frequency, hematuria, flank pain and difficulty urinating.  Musculoskeletal: Denies myalgias, back pain, joint swelling, arthralgias and gait problem.  she admits to having chronic back and leg pains due to arthritis Skin: Denies pallor, rash and wound.  Neurological: Denies dizziness, seizures, syncope, weakness, light-headedness, numbness..  Hematological: Denies adenopathy. Easy bruising, personal or family bleeding history  Psychiatric/Behavioral: Denies suicidal ideation, mood changes, confusion, nervousness, sleep disturbance and agitation  Past Medical History: Past Medical History  Diagnosis Date  . Diabetes mellitus   . HTN (hypertension) 08/04/2011       GERD  History reviewed. No pertinent past surgical history.  Medications: Prior to Admission medications   Medication Sig Start Date End Date Taking? Authorizing Provider  aspirin EC 81 MG tablet Take 81 mg by mouth daily.   Yes Historical Provider, MD  fexofenadine (ALLEGRA) 180 MG tablet Take 180 mg by mouth daily.   Yes Historical Provider, MD  ibuprofen (ADVIL,MOTRIN) 200 MG tablet Take 400 mg by mouth every 6 (six) hours as needed. For pain.   Yes Historical Provider, MD  insulin glargine (LANTUS) 100 UNIT/ML injection Inject 80 Units into the skin daily.   Yes Historical Provider, MD  insulin glulisine (APIDRA) 100 UNIT/ML injection Inject 20 Units into the skin 3 (three) times daily.   Yes Historical Provider, MD  OVER THE COUNTER MEDICATION Take 2 capsules by mouth every 4 (four) hours as needed. For sinus relief ( OTC Sinus Relief with phenylephrine and Guaifensin)   Yes Historical Provider, MD  Allergies:   Allergies  Allergen Reactions  . Hydrocodone     Makes heart race      Social History:  reports that she has never smoked. She does not have any smokeless tobacco history on file. She reports that she does not drink alcohol or use illicit drugs. she is functional with all her ADLs  Family History: History reviewed. No pertinent family history.  Physical Exam: Blood pressure 167/82, pulse 99, temperature 97.8 F (36.6 C), temperature source Oral, resp. rate 20, last menstrual period 05/05/2011, SpO2 97.00%. General: Alert, awake, oriented x3, in no acute distress. HEENT: anicteric sclera, pink conjunctiva, pupils equal and reactive to light and accomodation Neck: supple, no masses or lymphadenopathy, no goiter, no bruits  Heart: Regular rate and rhythm, without murmurs, rubs or gallops. Lungs: Clear to auscultation bilaterally, no wheezing, rales or rhonchi. Abdomen: Morbidly obese, Soft, nontender, nondistended, positive bowel sounds, no masses. Extremities: No clubbing, cyanosis or edema with positive pedal pulses. Neuro: Grossly intact, no focal neurological deficits, strength 5/5 upper and lower extremities bilaterally Psych: alert and oriented x 3, normal mood and affect Skin: no rashes or lesions, warm and dry   LABS on Admission:  Basic Metabolic Panel:  Lab 08/04/11 1610 08/03/11 2217  NA 137 135  K 4.0 4.0  CL 100 97  CO2 25 27  GLUCOSE 136* 184*  BUN 13 15  CREATININE 0.59 0.79  CALCIUM 9.5 9.9  MG -- --  PHOS -- --   Liver Function Tests:  Lab 08/03/11 2217  AST 19  ALT 21  ALKPHOS 127*  BILITOT 0.4  PROT 7.9  ALBUMIN 3.6   CBC:  Lab 08/04/11 0754 08/03/11 2217  WBC 10.5 11.4*  NEUTROABS -- 7.0  HGB 13.1 13.1  HCT 36.9 36.6  MCV 82.6 --  PLT 347 360   Cardiac Enzymes:  Lab 08/04/11 0754 08/03/11 2217  CKTOTAL -- 99  CKMB -- 1.9  CKMBINDEX -- --  TROPONINI <0.30 --   BNP: No components found with this basename: POCBNP:2 CBG:  Lab 08/04/11 1156  GLUCAP 125*     Radiological Exams on Admission: Dg  Chest 2 View  08/04/2011  *RADIOLOGY REPORT*  Clinical Data: Left-sided chest pain and left arm numbness; hypertension.  CHEST - 2 VIEW  Comparison: Chest radiograph performed 07/21/2010  Findings: The lungs are well-aerated and clear.  There is no evidence of focal opacification, pleural effusion or pneumothorax.  The heart is normal in size; the mediastinal contour is within normal limits.  No acute osseous abnormalities are seen.  IMPRESSION: No acute cardiopulmonary process seen.  Original Report Authenticated By: Tonia Ghent, M.D.   Dg Chest Portable 1 View  08/04/2011  *RADIOLOGY REPORT*  Clinical Data: Tachycardia  PORTABLE CHEST - 1 VIEW  Comparison: Chest x-ray of 08/04/2011  Findings: The lungs are clear.  The heart is within upper limits of normal.  No acute bony abnormality is seen.  IMPRESSION: Borderline cardiomegaly.  No active lung disease.  Original Report Authenticated By: Juline Patch, M.D.   EKG: 105, sinus tachycardia, low voltage, no acute ST-T wave elevations or depressions  Assessment/Plan Present on Admission:  .Chest pain:  - Admit for observation to rule out ACS, risk factors are uncontrolled diabetes, hypertension, hyperlipidemia, morbid obesity   -  Obtain serial cardiac enzymes, lipid panel, TSH given tachycardia. D-dimer is negative - Place on aspirin, beta blocker, cardiology consult for possible stress test (called [facility] cardiology) - ? Given dyspnea on  exertion, needs 2-D echo or MUGA scan for EF, will defer to cardiology.    .Diabetes mellitus:  - Obtain HbA1c, place on Lantus and Apidra, carb modified diet   .Hyperlipidemia:  - Obtain lipid panel   .Tachycardia - D-dimer is negative, obtain TSH, serial cardiac enzymes - Placed on metoprolol, ? Anxiety component   .Obesity: - Counseled on weight control   .HTN (hypertension): Patient does not carry a diagnosis of hypertension but states that her SBP was in 190s yesterday while at work and on  the monitor between 170-190 today - Placed on metoprolol, should be on ACE inhibitor as well given her uncontrolled diabetes for cardio and renal protective action   DVT prophylaxis: Lovenox   CODE STATUS: Full code    Further plan will depend as patient's clinical course evolves and further radiologic and laboratory data become available.   @Time  Spent on Admission: 1 hour  Joscelyne Renville M.D. [facility] 08/04/2011, 12:28 PM

## 2011-08-04 NOTE — Discharge Instructions (Signed)

## 2011-08-04 NOTE — ED Provider Notes (Signed)
History     CSN: [id number]  Arrival date & time 08/04/11  0701   First MD Initiated Contact with Patient 08/04/11 0710      Chief Complaint  Patient presents with  . Tachycardia    (Consider location/radiation/quality/duration/timing/severity/associated sxs/prior treatment) HPI Comments: Elizabeth Huber is a 51 y.o. female who presents to the emergency department with outpatient and chest pain. She was discharged during the night after an evaluation for chest pain. Patient tells me that chest pain has been present since yesterday morning when she awoke. It has not gone away. She states that the pain was there when she left the emergency department earlier today. She was able to go to sleep but awoke with palpitations and noticed the chest pain was still there. The array of discomfort caused her to return to the emergency department. She does not have ongoing chest pain. She has an endocrinologist, Dr. Sharl Ma, but does not regularly see her primary care Dr. She states her glucose has been elevated and Dr. Sharl Ma has had to increase her medication. She denies associated fever, chills, nausea, vomiting. The chest discomfort is a tight feeling and radiates to both arms, and the back of the neck. She also has a headache, and would like some medicine for it. She did not take anything at home for the discomfort. She denies dizziness with ambulation. She admits to not getting much exercise, and states at work she has to move very little.  The history is provided by the patient.    Past Medical History  Diagnosis Date  . Diabetes mellitus     History reviewed. No pertinent past surgical history.  History reviewed. No pertinent family history.  History  Substance Use Topics  . Smoking status: Never Smoker   . Smokeless tobacco: Not on file  . Alcohol Use: No    OB History    Grav Para Term Preterm Abortions TAB SAB Ect Mult Living                  Review of Systems  All other systems  reviewed and are negative.    Allergies  Hydrocodone  Home Medications   Current Outpatient Rx  Name Route Sig Dispense Refill  . ASPIRIN EC 81 MG PO TBEC Oral Take 81 mg by mouth daily.    Marland Kitchen FEXOFENADINE HCL 180 MG PO TABS Oral Take 180 mg by mouth daily.    . IBUPROFEN 200 MG PO TABS Oral Take 400 mg by mouth every 6 (six) hours as needed. For pain.    . INSULIN GLARGINE 100 UNIT/ML Lucas SOLN Subcutaneous Inject 80 Units into the skin daily.    . INSULIN GLULISINE 100 UNIT/ML Greer SOLN Subcutaneous Inject 20 Units into the skin 3 (three) times daily.    Marland Kitchen OVER THE COUNTER MEDICATION Oral Take 2 capsules by mouth every 4 (four) hours as needed. For sinus relief ( OTC Sinus Relief with phenylephrine and Guaifensin)      BP 167/82  Pulse 99  Temp(Src) 97.8 F (36.6 C) (Oral)  Resp 20  SpO2 97%  LMP 05/05/2011  Physical Exam  Nursing note and vitals reviewed. Constitutional: She is oriented to person, place, and time. She appears well-developed and well-nourished.       She is morbidly obese  HENT:  Head: Normocephalic and atraumatic.  Eyes: Conjunctivae and EOM are normal. Pupils are equal, round, and reactive to light.  Neck: Normal range of motion and phonation normal. Neck supple.  Cardiovascular: Normal rate, regular rhythm and intact distal pulses.   Pulmonary/Chest: Effort normal and breath sounds normal. She exhibits no tenderness.       Chest tenderness to touch in the anterior chest bilaterally. No crepitation  Abdominal: Soft. She exhibits no distension. There is no tenderness. There is no guarding.  Musculoskeletal: Normal range of motion.       There is tenderness to touch on her bilateral shoulders, and posterior neck. There is no associated crepitation, redness or swelling  Neurological: She is alert and oriented to person, place, and time. She has normal strength. She exhibits normal muscle tone.  Skin: Skin is warm and dry.  Psychiatric: She has a normal mood and  affect. Her behavior is normal. Judgment and thought content normal.    ED Course  Procedures (including critical care time)   Date: 08/04/2011  Rate: 105  Rhythm: sinus tachycardia  QRS Axis: normal  Intervals: normal  ST/T Wave abnormalities: normal  Conduction Disutrbances:none  Narrative Interpretation:   Old EKG Reviewed: unchanged   Labs Reviewed  BASIC METABOLIC PANEL - Abnormal; Notable for the following:    Glucose, Bld 136 (*)    All other components within normal limits  CBC  URINALYSIS, ROUTINE W REFLEX MICROSCOPIC  D-DIMER, QUANTITATIVE  TROPONIN I   Dg Chest 2 View  08/04/2011  *RADIOLOGY REPORT*  Clinical Data: Left-sided chest pain and left arm numbness; hypertension.  CHEST - 2 VIEW  Comparison: Chest radiograph performed 07/21/2010  Findings: The lungs are well-aerated and clear.  There is no evidence of focal opacification, pleural effusion or pneumothorax.  The heart is normal in size; the mediastinal contour is within normal limits.  No acute osseous abnormalities are seen.  IMPRESSION: No acute cardiopulmonary process seen.  Original Report Authenticated By: Tonia Ghent, M.D.   Dg Chest Portable 1 View  08/04/2011  *RADIOLOGY REPORT*  Clinical Data: Tachycardia  PORTABLE CHEST - 1 VIEW  Comparison: Chest x-ray of 08/04/2011  Findings: The lungs are clear.  The heart is within upper limits of normal.  No acute bony abnormality is seen.  IMPRESSION: Borderline cardiomegaly.  No active lung disease.  Original Report Authenticated By: Juline Patch, M.D.     1. Chest pain   2. Palpitations   3. Headache   4. Obesity       MDM  Palpitations, and chest pain with cardiac risk elevation, however, she is not a candidate for rule out of cardiac chest pain in the observation unit. Will seek admission for definitive testing.     Plan: Admit- admit to [facility] for cardiac risk stratification     Flint Melter, MD 08/04/11 1110

## 2011-08-04 NOTE — ED Provider Notes (Signed)
Pt seen with PA Pt here for CP Story is inconsistent, she told me that pain started while at work, she told PA that she woke up with pain and seemed worse with cough EKG reviewed Will check 2nd troponin and monitor pain  Joya Gaskins, MD 08/04/11 0001

## 2011-08-04 NOTE — ED Notes (Signed)
Patient states that she was seen here last night for the same complaint. She states that she is feeling like her heart is racing. She states that she feels slightly short of breath when this occurs and also feels what she describes as feverish. She states that she has also has been having headaches that have been waking her up. She is alert and oriented. Breath sounds are clear and bowel sounds are present. cbg at home was 138. Protocols initiated.

## 2011-08-04 NOTE — ED Notes (Signed)
States she was awaken this am at  630 with tachycardia , states she was in the ed last pm for same.

## 2011-08-04 NOTE — ED Provider Notes (Signed)
Medical screening examination/treatment/procedure(s) were conducted as a shared visit with non-physician practitioner(s) and myself.  I personally evaluated the patient during the encounter  Pt did not have pleuritic type CP on my exam She is well appearing/no distress   Date: 08/03/2011  Rate: 118  Rhythm: sinus tachycardia  QRS Axis: normal  Intervals: normal  ST/T Wave abnormalities: nonspecific ST changes  Conduction Disutrbances:none     Joya Gaskins, MD 08/04/11 [phone]

## 2011-08-04 NOTE — ED Notes (Signed)
Orders completed. Waiting on lab results. Pt and family provided with education about plan of care and drinks. Resting comfortably. No complaints.

## 2011-08-04 NOTE — Consult Note (Signed)
Reason for Consult: Chest pain  Requesting Physician: [facility]  HPI: This is a 51 y.o. female with a past medical history significant for morbid obesity, IDDM, and dyslipidemia. She was seen in the ER yesterday with chest pain. Her symptoms were somewhat atypical for chest pain, being constant, tender in the epigastric area, and with a negative Troponin. She came back today with complaints of continued epigastric pain and tachycardia. She admits she has been taking over the counter pseudoephedrine for allergic sinusitis. Her pain radiates down her arms and to the back of her neck. It is not worsened by deep breathing or movement. She does complain of DOE (.anything more than 3 steps)  PMHx:  Past Medical History  Diagnosis Date  . Diabetes mellitus   . HTN (hypertension) 08/04/2011   History reviewed. No pertinent past surgical history.  FAMHx: History reviewed. No pertinent family history. No family history of early CAD.  SOCHx:  reports that she has never smoked. She does not have any smokeless tobacco history on file. She reports that she does not drink alcohol or use illicit drugs.  ALLERGIES: Allergies  Allergen Reactions  . Hydrocodone     Makes heart race    ROS: recent allergic sinus problems Chronic constipation  HOME MEDICATIONS:  (Not in a hospital admission)  HOSPITAL MEDICATIONS: I have reviewed the patient's current medications.  VITALS: Blood pressure 167/82, pulse 99, temperature 97.8 F (36.6 C), temperature source Oral, resp. rate 20, last menstrual period 05/05/2011, SpO2 97.00%.  PHYSICAL EXAM: General appearance: alert, cooperative, no distress and moderately obese Neck: no carotid bruit and supple, symmetrical, trachea midline Lungs: clear to auscultation bilaterally Heart: regular rate and rhythm Abdomen: morbid obesity, no RUQ or LUQ pain with palpation, she does have epigastric tenderness Extremities: extremities normal, atraumatic,  no cyanosis or edema Pulses: 2+ and symmetric Skin: Skin color, texture, turgor normal. No rashes or lesions Neurologic: Grossly normal  LABS: Results for orders placed during the hospital encounter of 08/04/11 (from the past 48 hour(s))  CBC     Status: Normal   Collection Time   08/04/11  7:54 AM      Component Value Range Comment   WBC 10.5  4.0 - 10.5 (K/uL)    RBC 4.47  3.87 - 5.11 (MIL/uL)    Hemoglobin 13.1  12.0 - 15.0 (g/dL)    HCT 40.9  81.1 - 91.4 (%)    MCV 82.6  78.0 - 100.0 (fL)    MCH 29.3  26.0 - 34.0 (pg)    MCHC 35.5  30.0 - 36.0 (g/dL)    RDW 78.2  95.6 - 21.3 (%)    Platelets 347  150 - 400 (K/uL)   BASIC METABOLIC PANEL     Status: Abnormal   Collection Time   08/04/11  7:54 AM      Component Value Range Comment   Sodium 137  135 - 145 (mEq/L)    Potassium 4.0  3.5 - 5.1 (mEq/L)    Chloride 100  96 - 112 (mEq/L)    CO2 25  19 - 32 (mEq/L)    Glucose, Bld 136 (*) 70 - 99 (mg/dL)    BUN 13  6 - 23 (mg/dL)    Creatinine, Ser 0.86  0.50 - 1.10 (mg/dL)    Calcium 9.5  8.4 - 10.5 (mg/dL)    GFR calc non Af Amer >90  >90 (mL/min)    GFR calc Af Amer >90  >90 (mL/min)  D-DIMER, QUANTITATIVE     Status: Normal   Collection Time   08/04/11  7:54 AM      Component Value Range Comment   D-Dimer, Quant <0.22  0.00 - 0.48 (ug/mL-FEU)   TROPONIN I     Status: Normal   Collection Time   08/04/11  7:54 AM      Component Value Range Comment   Troponin I <0.30  <0.30 (ng/mL)   URINALYSIS, ROUTINE W REFLEX MICROSCOPIC     Status: Normal   Collection Time   08/04/11  8:54 AM      Component Value Range Comment   Color, Urine YELLOW  YELLOW     APPearance CLEAR  CLEAR     Specific Gravity, Urine 1.011  1.005 - 1.030     pH 5.5  5.0 - 8.0     Glucose, UA NEGATIVE  NEGATIVE (mg/dL)    Hgb urine dipstick NEGATIVE  NEGATIVE     Bilirubin Urine NEGATIVE  NEGATIVE     Ketones, ur NEGATIVE  NEGATIVE (mg/dL)    Protein, ur NEGATIVE  NEGATIVE (mg/dL)    Urobilinogen, UA 0.2   0.0 - 1.0 (mg/dL)    Nitrite NEGATIVE  NEGATIVE     Leukocytes, UA NEGATIVE  NEGATIVE  MICROSCOPIC NOT DONE ON URINES WITH NEGATIVE PROTEIN, BLOOD, LEUKOCYTES, NITRITE, OR GLUCOSE <1000 mg/dL.  GLUCOSE, CAPILLARY     Status: Abnormal   Collection Time   08/04/11 11:56 AM      Component Value Range Comment   Glucose-Capillary 125 (*) 70 - 99 (mg/dL)     IMAGING: Dg Chest 2 View  08/04/2011  *RADIOLOGY REPORT*  Clinical Data: Left-sided chest pain and left arm numbness; hypertension.  CHEST - 2 VIEW  Comparison: Chest radiograph performed 07/21/2010  Findings: The lungs are well-aerated and clear.  There is no evidence of focal opacification, pleural effusion or pneumothorax.  The heart is normal in size; the mediastinal contour is within normal limits.  No acute osseous abnormalities are seen.  IMPRESSION: No acute cardiopulmonary process seen.  Original Report Authenticated By: Tonia Ghent, M.D.   Dg Chest Portable 1 View  08/04/2011  *RADIOLOGY REPORT*  Clinical Data: Tachycardia  PORTABLE CHEST - 1 VIEW  Comparison: Chest x-ray of 08/04/2011  Findings: The lungs are clear.  The heart is within upper limits of normal.  No acute bony abnormality is seen.  IMPRESSION: Borderline cardiomegaly.  No active lung disease.  Original Report Authenticated By: Juline Patch, M.D.    IMPRESSION: Principal Problem:  *Chest pain Active Problems:  Diabetes mellitus, Type 2 IDDM  HTN (hypertension)  Hyperlipidemia  Tachycardia, sinus tach on admission  Obesity   RECOMMENDATION: D-Dimer is WNL.To r/o CAD she would need a diagnostic radial cath. She is too obese for a Myoview. Check Amylase and Lipase, she did have sludge in her GB a year ago when admitted for abdominal pain and her WBC is borderline high. Check TSH. Increase Metoprolol to 25mg  BID. MD to see.  Time Spent Directly with Patient: 30 minutes  KILROY,LUKE K 08/04/2011, 2:09 PM    Patient seen and examined. Agree with assessment and  plan.  Very pleasant morbidly obese WF with cardiac risk factor including DM x 14 years, and hyperlipidemia.  She has gained over 120 lbs over the past 1 - 2 years.  She was awakened with chest pressure and palpitations.  She does snore. High suspicion for obstructive sleep apnea, and will need outpt assessment.  With weight of over 330 lbs, worrisome symptoms and a high likelihood for attenuation artifact on non-ivasive testing, recommend definitive cardiac cath via radial approach tomorrow.  Pt agrees to pursue.  Will set up in am with colleagues.   Lennette Bihari, MD, [facility] 08/04/2011 5:21 PM

## 2011-08-05 ENCOUNTER — Encounter (HOSPITAL_COMMUNITY)
Admission: EM | Disposition: A | Discharge status: Home / Self Care | Payer: Self-pay | Source: Home / Self Care | Attending: Internal Medicine

## 2011-08-05 HISTORY — PX: CORONARY ANGIOGRAM: SHX5466

## 2011-08-05 LAB — PROTIME-INR
INR: 1.06 (ref 0.00–1.49)
Prothrombin Time: 14 seconds (ref 11.6–15.2)

## 2011-08-05 LAB — CBC
HCT: 36.4 % (ref 36.0–46.0)
Hemoglobin: 12.6 g/dL (ref 12.0–15.0)
MCH: 29.2 pg (ref 26.0–34.0)
MCHC: 34.6 g/dL (ref 30.0–36.0)
MCV: 84.3 fL (ref 78.0–100.0)
Platelets: 346 10*3/uL (ref 150–400)
RBC: 4.32 MIL/uL (ref 3.87–5.11)
RDW: 12.6 % (ref 11.5–15.5)
WBC: 10.6 10*3/uL — ABNORMAL HIGH (ref 4.0–10.5)

## 2011-08-05 LAB — BASIC METABOLIC PANEL
BUN: 13 mg/dL (ref 6–23)
CO2: 26 mEq/L (ref 19–32)
Calcium: 9.1 mg/dL (ref 8.4–10.5)
Chloride: 99 mEq/L (ref 96–112)
Creatinine, Ser: 0.59 mg/dL (ref 0.50–1.10)
GFR calc Af Amer: >90 mL/min (ref >90)
GFR calc non Af Amer: >90 mL/min (ref >90)
Glucose, Bld: 155 mg/dL — ABNORMAL HIGH (ref 70–99)
Potassium: 4 mEq/L (ref 3.5–5.1)
Sodium: 135 mEq/L (ref 135–145)

## 2011-08-05 LAB — LIPID PANEL
Cholesterol: 189 mg/dL (ref 0–200)
HDL: 44 mg/dL (ref >39)
LDL Cholesterol: 116 mg/dL — ABNORMAL HIGH (ref 0–99)
Total CHOL/HDL Ratio: 4.3 RATIO
Triglycerides: 144 mg/dL (ref <150)
VLDL: 29 mg/dL (ref 0–40)

## 2011-08-05 LAB — GLUCOSE, CAPILLARY
Glucose-Capillary: 143 mg/dL — ABNORMAL HIGH (ref 70–99)
Glucose-Capillary: 127 mg/dL — ABNORMAL HIGH (ref 70–99)
Glucose-Capillary: 109 mg/dL — ABNORMAL HIGH (ref 70–99)
Glucose-Capillary: 230 mg/dL — ABNORMAL HIGH (ref 70–99)

## 2011-08-05 LAB — CARDIAC PANEL(CRET KIN+CKTOT+MB+TROPI)
CK, MB: 1.9 ng/mL (ref 0.3–4.0)
Relative Index: INVALID (ref 0.0–2.5)
Total CK: 92 U/L (ref 7–177)
Troponin I: <0.3 ng/mL (ref <0.30)

## 2011-08-05 LAB — HEMOGLOBIN A1C
Hgb A1c MFr Bld: 8.4 % — ABNORMAL HIGH (ref <5.7)
Mean Plasma Glucose: 194 mg/dL — ABNORMAL HIGH (ref <117)

## 2011-08-05 SURGERY — CORONARY ANGIOGRAM
Anesthesia: Choice

## 2011-08-05 SURGERY — LEFT HEART CATHETERIZATION WITH CORONARY ANGIOGRAM
Anesthesia: LOCAL

## 2011-08-05 MED ORDER — ACETAMINOPHEN 325 MG PO TABS: 650.0000 mg | ORAL_TABLET | ORAL | Status: DC | PRN

## 2011-08-05 MED ORDER — ONDANSETRON HCL 4 MG/2ML IJ SOLN: 4.0000 mg | Freq: Four times a day (QID) | INTRAMUSCULAR | Status: DC | PRN

## 2011-08-05 MED ORDER — SODIUM CHLORIDE 0.9 % IV SOLN
1.0000 mL/kg/h | INTRAVENOUS | Status: AC
  Administered 2011-08-05 (×2): 1 mL/kg/h via INTRAVENOUS

## 2011-08-05 MED ORDER — SODIUM CHLORIDE 0.9 % IJ SOLN: 3.0000 mL | Freq: Two times a day (BID) | INTRAMUSCULAR | Status: DC

## 2011-08-05 MED ORDER — SODIUM CHLORIDE 0.9 % IJ SOLN: 3.0000 mL | INTRAMUSCULAR | Status: DC | PRN

## 2011-08-05 MED ORDER — SODIUM CHLORIDE 0.9 % IV SOLN: 250.0000 mL | INTRAVENOUS | Status: DC

## 2011-08-05 MED ORDER — OXYCODONE-ACETAMINOPHEN 5-325 MG PO TABS: 1.0000 | ORAL_TABLET | ORAL | Status: DC | PRN

## 2011-08-05 MED ORDER — DIAZEPAM 2 MG PO TABS: 2.0000 mg | ORAL_TABLET | ORAL | Status: DC | PRN

## 2011-08-05 MED ORDER — MIDAZOLAM HCL 2 MG/2ML IJ SOLN
INTRAMUSCULAR | Status: AC
  Filled 2011-08-05: qty 2

## 2011-08-05 NOTE — Progress Notes (Signed)
[facility]  Subjective: Still with some CP.  She explains it is like"some is sitting on me".  Exacerbated with palpation  Objective: Vital signs in last 24 hours: Temp:  [97.5 F (36.4 C)-98.2 F (36.8 C)] 97.5 F (36.4 C) (03/19 0412) Pulse Rate:  [82-99] 85  (03/19 1004) Resp:  [20] 20  (03/19 0412) BP: (110-167)/(64-82) 110/74 mmHg (03/19 1004) SpO2:  [91 %-97 %] 96 % (03/19 0412) Weight:  [151.1 kg (333 lb 1.8 oz)] 151.1 kg (333 lb 1.8 oz) (03/18 1427) Last BM Date: 08/04/11  Intake/Output from previous day:   Intake/Output this shift:    Medications Current Facility-Administered Medications  Medication Dose Route Frequency Provider Last Rate Last Dose  . 0.9 %  sodium chloride infusion  250 mL Intravenous PRN Abelino Derrick, PA      . 0.9 %  sodium chloride infusion   Intravenous Continuous Eda Paschal [place], [place] 75 mL/hr at 08/05/11 0118    . acetaminophen (TYLENOL) tablet 650 mg  650 mg Oral Q6H PRN Ripudeep Jenna Luo, MD   650 mg at 08/04/11 2042   Or  . acetaminophen (TYLENOL) suppository 650 mg  650 mg Rectal Q6H PRN Ripudeep Jenna Luo, MD      . alum & mag hydroxide-simeth (MAALOX/MYLANTA) 200-200-20 MG/5ML suspension 30 mL  30 mL Oral Q6H PRN Ripudeep K Rai, MD      . aspirin chewable tablet 324 mg  324 mg Oral [address] [place], [place]   324 mg at 08/05/11 0602  . aspirin EC tablet 81 mg  81 mg Oral Daily Ripudeep K Rai, MD      . diazepam (VALIUM) tablet 5 mg  5 mg Oral On Call Abelino Derrick, PA      . enoxaparin (LOVENOX) injection 40 mg  40 mg Subcutaneous Q24H Eda Paschal [place], [place]   40 mg at 08/04/11 1735  . HYDROmorphone (DILAUDID) injection 1 mg  1 mg Intravenous Q4H PRN Ripudeep K Rai, MD   1 mg at 08/04/11 1332  . insulin aspart (novoLOG) injection 20 Units  20 Units Subcutaneous TID WC Ripudeep Jenna Luo, MD   20 Units at 08/04/11 1734  . insulin glargine (LANTUS) injection 80 Units  80 Units Subcutaneous Daily Ripudeep Jenna Luo, MD   80 Units at  08/04/11 1600  . loratadine (CLARITIN) tablet 10 mg  10 mg Oral Daily Ripudeep K Rai, MD   10 mg at 08/05/11 1004  . metoprolol tartrate (LOPRESSOR) tablet 25 mg  25 mg Oral BID Eda Paschal [place], PA   25 mg at 08/05/11 1004  . ondansetron (ZOFRAN) tablet 4 mg  4 mg Oral Q6H PRN Ripudeep K Rai, MD       Or  . ondansetron (ZOFRAN) injection 4 mg  4 mg Intravenous Q6H PRN Ripudeep Jenna Luo, MD   4 mg at 08/04/11 1331  . oxyCODONE-acetaminophen (PERCOCET) 5-325 MG per tablet 1 tablet  1 tablet Oral Once Flint Melter, MD   1 tablet at 08/04/11 1206  . oxyCODONE-acetaminophen (PERCOCET) 5-325 MG per tablet 1-2 tablet  1-2 tablet Oral Q4H PRN Ripudeep Jenna Luo, MD   2 tablet at 08/04/11 1845  . pantoprazole (PROTONIX) EC tablet 40 mg  40 mg Oral Q0600 Ripudeep Jenna Luo, MD   40 mg at 08/05/11 0602  . sodium chloride 0.9 % injection 3 mL  3 mL Intravenous Q12H Ripudeep Jenna Luo, MD   3 mL at  08/04/11 2219  . sodium chloride 0.9 % injection 3 mL  3 mL Intravenous Q12H [employer], [place]      . sodium chloride 0.9 % injection 3 mL  3 mL Intravenous PRN Abelino Derrick, PA      . DISCONTD: 0.9 %  sodium chloride infusion  20 mL Intravenous Continuous Flint Melter, MD 125 mL/hr at 08/04/11 1050 20 mL at 08/04/11 1050  . DISCONTD: 0.9 %  sodium chloride infusion  20 mL Intravenous Continuous Ripudeep K Rai, MD 75 mL/hr at 08/04/11 1320 20 mL at 08/04/11 1320  . DISCONTD: aspirin EC tablet 81 mg  81 mg Oral Daily Ripudeep Jenna Luo, MD   81 mg at 08/04/11 1725  . DISCONTD: enoxaparin (LOVENOX) injection 40 mg  40 mg Subcutaneous Q24H Ripudeep K Rai, MD      . DISCONTD: insulin glulisine (APIDRA) injection 20 Units  20 Units Subcutaneous TID AC Ripudeep K Rai, MD      . DISCONTD: metoprolol tartrate (LOPRESSOR) tablet 12.5 mg  12.5 mg Oral BID Ripudeep Jenna Luo, MD   12.5 mg at 08/04/11 1331    PE: General appearance: alert, cooperative and no distress Lungs: clear to auscultation bilaterally Heart: regular rate and rhythm,  S1, S2 normal, no murmur, click, rub or gallop Extremities: 1+ LEE Pulses: 2+ and symmetric  Lab Results:   Basename 08/05/11 0306 08/04/11 1755 08/04/11 0754  WBC 10.6* 10.3 10.5  HGB 12.6 12.0 13.1  HCT 36.4 35.8* 36.9  PLT 346 339 347   BMET  Basename 08/05/11 0306 08/04/11 1502 08/04/11 0754 08/03/11 2217  NA 135 -- 137 135  K 4.0 -- 4.0 4.0  CL 99 -- 100 97  CO2 26 -- 25 27  GLUCOSE 155* -- 136* 184*  BUN 13 -- 13 15  CREATININE 0.59 0.64 0.59 --  CALCIUM 9.1 -- 9.5 9.9   PT/INR  Basename 08/05/11 0306  LABPROT 14.0  INR 1.06   Cholesterol  Basename 08/05/11 0306  CHOL 189   Cardiac Enzymes Negative.  Assessment/Plan  Principal Problem:  *Chest pain Active Problems:  Diabetes mellitus, Type 2 IDDM  Hyperlipidemia  Tachycardia, sinus tach on admission  Obesity  HTN (hypertension)  Plan:  On for radial left heart cath.  CP exacerbated with palpation not with deep inspiration.  Cardiac enzymes are negative. Extensive discussion on diet and exercise(greater than ).   LOS: 1 day    HAGER,BRYAN W 08/05/2011 10:16 AM  I have seen and examined the patient along with HAGER,BRYAN W, PA.  I have reviewed the chart, notes and new data.  I agree with NP's note.  Key new complaints: ongoing atypical chest discomfort Key examination changes: morbid obesity  Key new findings / data: normal ECg and cardiac enzymes  PLAN: Risks and benefits of cardiac cath and possible PCI reviewed in detail, including need for conversion from radial to femoral approach, renal failure, bleeding and small risk of MI, stroke or death.  Thurmon Fair, MD, [facility] [phone] 08/05/2011, 1:44 PM

## 2011-08-05 NOTE — Progress Notes (Signed)
Patient ID: Elizabeth Huber  female  UUV:[id number]    DOB: 08/13/1960    DOA: 08/04/2011  PCP: Lolita Patella, MD, MD  Subjective: Still some chest pain  Objective: Weight change:   Intake/Output Summary (Last 24 hours) at 08/05/11 1408 Last data filed at 08/05/11 1200  Gross per 24 hour  Intake      0 ml  Output      0 ml  Net      0 ml   Blood pressure 151/88, pulse 81, temperature 98.9 F (37.2 C), temperature source Oral, resp. rate 21, height 5\' 4"  (1.626 m), weight 151.1 kg (333 lb 1.8 oz), last menstrual period 04/22/2011, SpO2 92.00%.  Physical Exam: General: Alert and awake, oriented x3, not in any acute distress. HEENT: anicteric sclera, pupils reactive to light and accommodation, EOMI CVS: S1-S2 clear, no murmur rubs or gallops Chest: clear to auscultation bilaterally, no wheezing, rales or rhonchi Abdomen: Morbidly obese, soft nontender, nondistended, normal bowel sounds, no organomegaly Extremities: no cyanosis, clubbing or edema noted bilaterally Neuro: Cranial nerves II-XII intact, no focal neurological deficits  Lab Results: Basic Metabolic Panel:  Lab 08/05/11 4742 08/04/11 1502 08/04/11 0754  NA 135 -- 137  K 4.0 -- 4.0  CL 99 -- 100  CO2 26 -- 25  GLUCOSE 155* -- 136*  BUN 13 -- 13  CREATININE 0.59 0.64 --  CALCIUM 9.1 -- 9.5  MG -- -- --  PHOS -- -- --   Liver Function Tests:  Lab 08/03/11 2217  AST 19  ALT 21  ALKPHOS 127*  BILITOT 0.4  PROT 7.9  ALBUMIN 3.6    Lab 08/04/11 1502  LIPASE 16  AMYLASE 36   No results found for this basename: AMMONIA:2 in the last 168 hours CBC:  Lab 08/05/11 0306 08/04/11 1755 08/03/11 2217  WBC 10.6* 10.3 --  NEUTROABS -- -- 7.0  HGB 12.6 12.0 --  HCT 36.4 35.8* --  MCV 84.3 84.4 --  PLT 346 339 --   Cardiac Enzymes:  Lab 08/05/11 0306 08/04/11 2153 08/04/11 1448  CKTOTAL 92 91 85  CKMB 1.9 1.8 1.8  CKMBINDEX -- -- --  TROPONINI <0.30 <0.30 <0.30   BNP: No components found with this  basename: POCBNP:2 CBG:  Lab 08/05/11 1154 08/05/11 0707 08/04/11 2215 08/04/11 1650 08/04/11 1156  GLUCAP 127* 143* 135* 189* 125*     Micro Results: No results found for this or any previous visit (from the past 240 hour(s)).  Studies/Results: Dg Chest 2 View  08/04/2011  *RADIOLOGY REPORT*  Clinical Data: Left-sided chest pain and left arm numbness; hypertension.  CHEST - 2 VIEW  Comparison: Chest radiograph performed 07/21/2010  Findings: The lungs are well-aerated and clear.  There is no evidence of focal opacification, pleural effusion or pneumothorax.  The heart is normal in size; the mediastinal contour is within normal limits.  No acute osseous abnormalities are seen.  IMPRESSION: No acute cardiopulmonary process seen.  Original Report Authenticated By: Tonia Ghent, M.D.   Dg Chest Portable 1 View  08/04/2011  *RADIOLOGY REPORT*  Clinical Data: Tachycardia  PORTABLE CHEST - 1 VIEW  Comparison: Chest x-ray of 08/04/2011  Findings: The lungs are clear.  The heart is within upper limits of normal.  No acute bony abnormality is seen.  IMPRESSION: Borderline cardiomegaly.  No active lung disease.  Original Report Authenticated By: Juline Patch, M.D.    Medications: Scheduled Meds:   . aspirin  324 mg Oral Pre-Cath  .  aspirin EC  81 mg Oral Daily  . diazepam  5 mg Oral On Call  . enoxaparin  40 mg Subcutaneous Q24H  . insulin aspart  20 Units Subcutaneous TID WC  . insulin glargine  80 Units Subcutaneous Daily  . loratadine  10 mg Oral Daily  . metoprolol tartrate  25 mg Oral BID  . pantoprazole  40 mg Oral Q0600  . sodium chloride  3 mL Intravenous Q12H  . sodium chloride  3 mL Intravenous Q12H  . DISCONTD: aspirin EC  81 mg Oral Daily  . DISCONTD: enoxaparin  40 mg Subcutaneous Q24H  . DISCONTD: insulin glulisine  20 Units Subcutaneous TID AC  . DISCONTD: metoprolol tartrate  12.5 mg Oral BID   Continuous Infusions:   . sodium chloride 75 mL/hr at 08/05/11 0118  .  DISCONTD: sodium chloride 20 mL (08/04/11 1320)     Assessment/Plan: Principal Problem:  *Chest pain: Troponins negative, high risk given hyperlipidemia, hypertension, diabetes mellitus, morbid obesity  - Cardiac catheterization planned today, cardiology following - Continue aspirin, beta blocker  Active Problems:  Diabetes mellitus, Type 2 IDDM: A1c 8.4 - Continue current insulin regimen   Hyperlipidemia: Obtain lipid panel    Obesity: Patient strongly counseled on losing weight, diet control   HTN (hypertension): One BP reading elevated this morning, can add ACE inhibitor  DVT Prophylaxis: Lovenox  Code Status: Full code  Disposition: Hopefully tomorrow, if medically stable, will review cath findings   LOS: 1 day   Ishaaq Penna M.D. [facility] 08/05/2011, 2:08 PM Pager: [phone]

## 2011-08-05 NOTE — Progress Notes (Signed)
Utilization review complete 

## 2011-08-05 NOTE — CV Procedure (Signed)
CARDIAC CATHETERIZATION REPORT   Procedures performed:  1. Left heart catheterization via a right femoral approach; abandoned attempt at radial artery approach 2. Selective coronary angiography  3. Angio-Seal femoral hemostasis device  Reason for procedure:  Unexplained atypical chest pain  Procedure performed by: Thurmon Fair, MD, [facility]  Complications: none   Estimated blood loss: less than 5 mL   History:  Morbidly obese woman with insulin-requiring diabetes mellitus and unexplained chest discomfort at rest. EKG and cardiac enzymes are normal. Due to body habitus noninvasive studies cannot be performed.  Consent: The risks, benefits, and details of the procedure were explained to the patient. Risks including death, MI, stroke, bleeding, limb ischemia, renal failure and allergy were described and accepted by the patient. Informed written consent was obtained prior to proceeding.  Technique: The patient was brought to the cardiac catheterization laboratory in the fasting state. She was prepped and draped in the usual sterile fashion. Local anesthesia with 1% lidocaine was administered to the right wrist area. The radial artery was successfully accessed using an Angiocath. Brisk pulsatile arterial blood flow return was noted. Despite this  A Glidewire advanced with considerable difficulty. The trajectory of the Glidewire was confirmed with x-ray as following the typical distribution of the radial artery. However when the sheath was advanced over the wire there was no blood return suggesting that the sheath was extra arterial. The sheath was removed. Pulsatile arterial flow was again noted at the puncture sites. A radial compression band was applied for hemostasis. Further attempts at radial artery cannulation were abandoned.  The previously prepped right groin area was then accessed. Localized hemoglobins and lidocaine was administered. Using the modified Seldinger technique a 5 French right  common femoral artery sheath was introduced without difficulty. Under fluoroscopic guidance, using 5 [place] JL4, JR selective cannulation of the left coronary artery, right coronary artery and left ventricle were respectively performed. Several coronary angiograms in a variety of projections were recorded. Ventriculogram was not performed. Left ventricular pressure and a pull back to the aorta were recorded. No immediate complications occurred. At the end of the procedure, all catheters were removed. Femoral artery hemostasis was achieved with a 6 French Angio-Seal device.  Contrast used: 70 mL Omnipaque Medications administered: Versed 2 mg intravenously, nitroglycerin 200 mcg intracoronary.  Angiographic Findings:  1. The left main coronary artery is free of significant atherosclerosis and bifurcates in the usual fashion into the left anterior descending artery and left circumflex coronary artery. A tiny ramus intermedius artery is also seen. 2. The left anterior descending artery is a large vessel that reaches the apex and generates 2 major diagonal branches. There is evidence of moderate luminal irregularities and mild calcification. Moderate (approximately 40%) stenoses are seen is beyond the takeoff of the first diagonal artery and second diagonal artery. The appearance of the stenoses does not change significantly after administration of intracoronary nitroglycerin. 3. The left circumflex coronary artery is a small to medium-size vessel dominant vessel that generates 1 major oblque marginal artery. There is evidence of minimal luminal irregularities and no calcification. No hemodynamically meaningful stenoses are seen. 4. The right coronary artery is a very large-size dominant vessel that generates a bifurcating posterior lateral ventricular system as well as a very long posterior descending artery. There is evidence of minimal luminal irregularities and no calcification. No hemodynamically  meaningful stenoses are seen.  5. The left ventricle was not injected to reduce risk of contrast nephrotoxicity. There is no aortic valve stenosis by pullback.  The left ventricular end-diastolic pressure is 22 mm Hg.    IMPRESSIONS:  Minor coronary atherosclerosis in the mid LAD artery. This does not appear to be severe enough to cause symptoms. In a patient with diabetes it does justify aggressive treatment of coronary risk factors. Target LDL cholesterol should be less than 70 mg/dL.  RECOMMENDATION:  Evaluate for noncardiac cause of chest pain. Aggressively treat coronary risk factors especially LDL cholesterol and glycemic control.

## 2011-08-06 ENCOUNTER — Encounter (HOSPITAL_COMMUNITY): Payer: Self-pay

## 2011-08-06 DIAGNOSIS — I2510 Atherosclerotic heart disease of native coronary artery without angina pectoris: Secondary | ICD-10-CM | POA: Diagnosis present

## 2011-08-06 DIAGNOSIS — I471 Supraventricular tachycardia: Secondary | ICD-10-CM | POA: Diagnosis not present

## 2011-08-06 LAB — GLUCOSE, CAPILLARY
Glucose-Capillary: 135 mg/dL — ABNORMAL HIGH (ref 70–99)
Glucose-Capillary: 138 mg/dL — ABNORMAL HIGH (ref 70–99)

## 2011-08-06 LAB — LIPID PANEL
Cholesterol: 180 mg/dL (ref 0–200)
HDL: 44 mg/dL (ref >39)
LDL Cholesterol: 97 mg/dL (ref 0–99)
Total CHOL/HDL Ratio: 4.1 RATIO
Triglycerides: 195 mg/dL — ABNORMAL HIGH (ref <150)
VLDL: 39 mg/dL (ref 0–40)

## 2011-08-06 MED ORDER — METOPROLOL TARTRATE 25 MG PO TABS: 25.0000 mg | ORAL_TABLET | Freq: Two times a day (BID) | ORAL | Status: DC

## 2011-08-06 MED ORDER — OMEPRAZOLE 20 MG PO CPDR: 20.0000 mg | DELAYED_RELEASE_CAPSULE | Freq: Every day | ORAL | Status: DC

## 2011-08-06 MED FILL — Nicardipine HCl IV Soln 2.5 MG/ML: INTRAVENOUS | Qty: 1 | Status: AC

## 2011-08-06 NOTE — Progress Notes (Signed)
Pt given discharge instructions, follow up appointments, s/sx infection, medication list and when to call the doctor.  Pt verbalized understanding of instructions. Thomas Hoff

## 2011-08-06 NOTE — Discharge Instructions (Signed)
Angiography Care After Please read the instructions outlined below and refer to this sheet in the next few weeks. These discharge instructions provide you with general information on caring for yourself after you leave the hospital. Your cardiologist may also give you specific instructions. While your treatment has been planned according to the most current medical practices available, unavoidable complications occasionally occur.  HOME CARE INSTRUCTIONS   Take all medicines exactly as directed. You may need to keep taking blood thinners if they were prescribed for you.   Limit your activity for the first 48 hours. Avoid bending the place where the catheter was inserted.   Follow instructions about when you can drive or return to work.  SEEK IMMEDIATE MEDICAL CARE IF:   You develop chest pain, shortness of breath, feel faint, or pass out.   You have bleeding, swelling, or drainage from the catheter insertion site.   You develop pain, discoloration, coldness, or severe bruising in the leg or arm that held the catheter.   You develop bleeding from any other place such as the urine or bowels. This may be bright red blood in the urine or stools, or it may appear as black, tarry stools.   You have a fever.  Document Released: 11/21/2004 Document Revised: 04/24/2011 Document Reviewed: 02/09/2008 [facility] Patient Information 2012 [place], [place].

## 2011-08-06 NOTE — Progress Notes (Signed)
Subjective:  No chest pain  Objective:  Vital Signs in the last 24 hours: Temp:  [97.7 F (36.5 C)-98.9 F (37.2 C)] 98.4 F (36.9 C) (03/20 0520) Pulse Rate:  [81-96] 88  (03/20 0520) Resp:  [18-21] 19  (03/20 0520) BP: (103-151)/(64-88) 146/82 mmHg (03/20 0520) SpO2:  [90 %-96 %] 96 % (03/20 0520)  Intake/Output from previous day:  Intake/Output Summary (Last 24 hours) at 08/06/11 1154 Last data filed at 08/05/11 1200  Gross per 24 hour  Intake      0 ml  Output      0 ml  Net      0 ml    Physical Exam: General appearance: alert, cooperative, no distress and moderately obese Lungs: clear to auscultation bilaterally Heart: regular rate and rhythm   Rate: 88  Rhythm: normal sinus rhythm run of PSVT 2am  Lab Results:  Basename 08/05/11 0306 08/04/11 1755  WBC 10.6* 10.3  HGB 12.6 12.0  PLT 346 339    Basename 08/05/11 0306 08/04/11 1502 08/04/11 0754  NA 135 -- 137  K 4.0 -- 4.0  CL 99 -- 100  CO2 26 -- 25  GLUCOSE 155* -- 136*  BUN 13 -- 13  CREATININE 0.59 0.64 --    Basename 08/05/11 0306 08/04/11 2153  TROPONINI <0.30 <0.30   Hepatic Function Panel  Basename 08/03/11 2217  PROT 7.9  ALBUMIN 3.6  AST 19  ALT 21  ALKPHOS 127*  BILITOT 0.4  BILIDIR --  IBILI --    Basename 08/06/11 0520  CHOL 180    Basename 08/05/11 0306  INR 1.06    Imaging: Imaging results have been reviewed  Cardiac Studies:  Assessment/Plan:   Principal Problem:  *Chest pain Active Problems:  Diabetes mellitus, Type 2 IDDM  HTN (hypertension)  CAD (coronary artery disease) 40% LAD afte Dx1 08/05/11  Hyperlipidemia  Tachycardia, sinus tach on admission  Obesity  Plan- OK for discharge. We will arrange for an OP sleep study. Lipids can be followed by her endocrinologist, LDL 97 on statin. MD to see prior to discharge. Continue beta blocker,(new) as she had a run of PSVT.    Corine Shelter PA-C 08/06/2011, 11:54 AM

## 2011-08-06 NOTE — Progress Notes (Signed)
Pt. Seen and examined. Agree with the NP/PA-C note as written. Cath showed 40% LAD stenosis past the first diagonal. Short run of SVT overnight. Metoprolol was increased for this empirically. Will need follow-up with Dr. Tresa Endo. Agree with outpatient sleep study.   Chrystie Nose, MD, [facility] Attending Cardiologist [facility]

## 2011-08-06 NOTE — Discharge Summary (Addendum)
Patient ID: Elizabeth Huber MRN: [id number] DOB/AGE: [birth year] 51 y.o.  Admit date: 08/04/2011 Discharge date: 08/06/2011  Primary Care Physician:  Lolita Patella, MD, MD   Discharge Diagnoses:    Present on Admission:  .Chest pain .Diabetes mellitus, Type 2 IDDM .Hyperlipidemia .Tachycardia, sinus tachycardia .Obesity .HTN (hypertension)  Medication List  As of 08/06/2011  9:23 AM   STOP taking these medications         ibuprofen 200 MG tablet      OVER THE COUNTER MEDICATION         TAKE these medications         aspirin EC 81 MG tablet   Take 81 mg by mouth daily.      fexofenadine 180 MG tablet   Commonly known as: ALLEGRA   Take 180 mg by mouth daily.      insulin glargine 100 UNIT/ML injection   Commonly known as: LANTUS   Inject 80 Units into the skin daily.      insulin glulisine 100 UNIT/ML injection   Commonly known as: APIDRA   Inject 20 Units into the skin 3 (three) times daily.      metoprolol tartrate 25 MG tablet   Commonly known as: LOPRESSOR   Take 1 tablet (25 mg total) by mouth 2 (two) times daily.               Omeprazole 20 mg tablet                  Take 1 tablet Po daily      Consults: [facility]   Significant Diagnostic Studies:  Dg Chest 2 View  08/04/2011  *RADIOLOGY REPORT*  Clinical Data: Left-sided chest pain and left arm numbness; hypertension.  CHEST - 2 VIEW  Comparison: Chest radiograph performed 07/21/2010  Findings: The lungs are well-aerated and clear.  There is no evidence of focal opacification, pleural effusion or pneumothorax.  The heart is normal in size; the mediastinal contour is within normal limits.  No acute osseous abnormalities are seen.  IMPRESSION: No acute cardiopulmonary process seen.  Original Report Authenticated By: Tonia Ghent, M.D.   Dg Chest Portable 1 View  08/04/2011  *RADIOLOGY REPORT*  Clinical Data: Tachycardia  PORTABLE CHEST - 1 VIEW  Comparison: Chest  x-ray of 08/04/2011  Findings: The lungs are clear.  The heart is within upper limits of normal.  No acute bony abnormality is seen.  IMPRESSION: Borderline cardiomegaly.  No active lung disease.  Original Report Authenticated By: Juline Patch, M.D.    Brief H and P: For complete details please refer to admission H and P, but in brief Patient is a 51 year old female with history of diabetes mellitus on insulin, hyperlipidemia, GERD presented to [facility] ED with chest pain and palpitations. Patient initially presented to the ED yesterday with similar symptoms of chest pain. She was observed in the ED and had negative troponins with no EKG changes, patient was discharged home. Patient stated that she had the chest pain since yesterday morning and had not gone away. Patient was able to go to sleep but then woke up with palpitations and chest pain. She describes the chest pain as midsternal, 9/10 intensity sharp intermittent. She also complains of pain in the jaw and shoulders. She denies any associated nausea or vomiting, diaphoresis. She has baseline shortness of breath on exertion, feels short of breath on taking 3 stairs. Also complaining of headache since morning. She denies  any dizziness or lightheadedness or any focal weakness. She states that she was working in [employer] yesterday and noticed high BP in 190s. She denies any history of hypertension or taking any antihypertensives at home. Also states that her blood sugars has been uncontrolled and she is followed by Dr. Talmage Coin (her endocrinologist) and had recently increased her medications  Cardiac cath: Minor coronary atherosclerosis in the mid LAD artery. This does not appear to be severe enough to cause symptoms. In a patient with diabetes it does justify aggressive treatment of coronary risk factors. Target LDL cholesterol should be less than 70 mg/dL.  RECOMMENDATION:  Evaluate for noncardiac cause of chest pain. Aggressively treat  coronary risk factors especially LDL cholesterol and glycemic control.   Hospital Course:  Patient was admitted to telemetry, cardiac enzymes were negative , patient was seen by cardiology service and she underwent cardiac catheterization that showed minor: Other other disease in the immediate LAD and maximizing medical therapy and aggressively treating risk factors was recommended as above. Continue with aspirin, metoprolol was added during this hospitalization. She would also need glycemic control since her hemoglobin A1c is 8.4%, patient to follow with her endocrinologist her PCP for further adjustment of her insulin dose. Monitor Bp and adjust meds as need.Patient has history of GERD and she was taking ibuprofen as an outpatient, I discontinued ibuprofen and added omeprazole. Patient seen by cardiology today who cleared her for discharge to follow with Dr Tresa Endo as outpatient ,they will arrange for OP sleep study as well.   subjective:   patient seen and examined, denies any chest pain or shortness of breath.  Filed Vitals:   08/06/11 0520  BP: 146/82  Pulse: 88  Temp: 98.4 F (36.9 C)  Resp: 19    General: Alert, awake, oriented x3, in no acute distress. HEENT: No bruits, no goiter. Heart: Regular rate and rhythm, without murmurs, rubs, gallops. Lungs: Clear to auscultation bilaterally. Abdomen: Soft, nontender, nondistended, positive bowel sounds. Extremities: No clubbing cyanosis or edema with positive pedal pulses. Neuro: Grossly intact, nonfocal.   Disposition and Follow-up: To  home  Follow with PCP in one week.   Time spent on Discharge:  approximately 45 minutes.     Signed: Ona Rathert 08/06/2011, 9:23 AM

## 2011-08-06 NOTE — Progress Notes (Signed)
Patient Elizabeth Huber, 51 year old female suffered chest pains earlier in the week.  She looks forward to going home to her parents who are "biggest supporters.  Patient expressed thanks for Chaplain's provision of pastoral presence, prayer, and conversation.  No follow-up needed.

## 2011-10-04 ENCOUNTER — Emergency Department (HOSPITAL_COMMUNITY): Payer: BC Managed Care – PPO

## 2011-10-04 ENCOUNTER — Emergency Department (HOSPITAL_COMMUNITY)
Admission: EM | Admit: 2011-10-04 | Discharge: 2011-10-04 | Disposition: A | Discharge status: Home / Self Care | Payer: BC Managed Care – PPO | Attending: Emergency Medicine | Admitting: Emergency Medicine

## 2011-10-04 ENCOUNTER — Encounter (HOSPITAL_COMMUNITY): Payer: Self-pay | Admitting: Family Medicine

## 2011-10-04 DIAGNOSIS — B9689 Other specified bacterial agents as the cause of diseases classified elsewhere: Secondary | ICD-10-CM | POA: Insufficient documentation

## 2011-10-04 DIAGNOSIS — N760 Acute vaginitis: Secondary | ICD-10-CM

## 2011-10-04 DIAGNOSIS — R110 Nausea: Secondary | ICD-10-CM | POA: Insufficient documentation

## 2011-10-04 DIAGNOSIS — M545 Low back pain, unspecified: Secondary | ICD-10-CM | POA: Insufficient documentation

## 2011-10-04 DIAGNOSIS — R300 Dysuria: Secondary | ICD-10-CM | POA: Insufficient documentation

## 2011-10-04 DIAGNOSIS — R109 Unspecified abdominal pain: Secondary | ICD-10-CM | POA: Insufficient documentation

## 2011-10-04 DIAGNOSIS — K5900 Constipation, unspecified: Secondary | ICD-10-CM | POA: Insufficient documentation

## 2011-10-04 DIAGNOSIS — A499 Bacterial infection, unspecified: Secondary | ICD-10-CM | POA: Insufficient documentation

## 2011-10-04 DIAGNOSIS — M549 Dorsalgia, unspecified: Secondary | ICD-10-CM

## 2011-10-04 HISTORY — DX: Essential (primary) hypertension: I10

## 2011-10-04 LAB — WET PREP, GENITAL
Trich, Wet Prep: NONE SEEN
Yeast Wet Prep HPF POC: NONE SEEN

## 2011-10-04 LAB — CBC
HCT: 37.3 % (ref 36.0–46.0)
Hemoglobin: 12.9 g/dL (ref 12.0–15.0)
MCH: 29 pg (ref 26.0–34.0)
MCHC: 34.6 g/dL (ref 30.0–36.0)
MCV: 83.8 fL (ref 78.0–100.0)
Platelets: 370 10*3/uL (ref 150–400)
RBC: 4.45 MIL/uL (ref 3.87–5.11)
RDW: 12.6 % (ref 11.5–15.5)
WBC: 11 10*3/uL — ABNORMAL HIGH (ref 4.0–10.5)

## 2011-10-04 LAB — URINALYSIS, ROUTINE W REFLEX MICROSCOPIC
Bilirubin Urine: NEGATIVE
Glucose, UA: NEGATIVE mg/dL
Hgb urine dipstick: NEGATIVE
Ketones, ur: NEGATIVE mg/dL
Leukocytes, UA: NEGATIVE
Nitrite: NEGATIVE
Protein, ur: NEGATIVE mg/dL
Specific Gravity, Urine: 1.018 (ref 1.005–1.030)
Urobilinogen, UA: 0.2 mg/dL (ref 0.0–1.0)
pH: 5.5 (ref 5.0–8.0)

## 2011-10-04 LAB — POCT I-STAT, CHEM 8
BUN: 16 mg/dL (ref 6–23)
Calcium, Ion: 1.19 mmol/L (ref 1.12–1.32)
Chloride: 105 mEq/L (ref 96–112)
Creatinine, Ser: 0.6 mg/dL (ref 0.50–1.10)
Glucose, Bld: 139 mg/dL — ABNORMAL HIGH (ref 70–99)
HCT: 37 % (ref 36.0–46.0)
Hemoglobin: 12.6 g/dL (ref 12.0–15.0)
Potassium: 4.1 mEq/L (ref 3.5–5.1)
Sodium: 139 mEq/L (ref 135–145)
TCO2: 25 mmol/L (ref 0–100)

## 2011-10-04 LAB — DIFFERENTIAL
Basophils Absolute: 0 10*3/uL (ref 0.0–0.1)
Basophils Relative: 0 % (ref 0–1)
Eosinophils Absolute: 0.1 10*3/uL (ref 0.0–0.7)
Eosinophils Relative: 1 % (ref 0–5)
Lymphocytes Relative: 26 % (ref 12–46)
Lymphs Abs: 2.8 10*3/uL (ref 0.7–4.0)
Monocytes Absolute: 0.6 10*3/uL (ref 0.1–1.0)
Monocytes Relative: 5 % (ref 3–12)
Neutro Abs: 7.5 10*3/uL (ref 1.7–7.7)
Neutrophils Relative %: 68 % (ref 43–77)

## 2011-10-04 MED ORDER — POLYETHYLENE GLYCOL 3350 17 GM/SCOOP PO POWD: 17.0000 g | Freq: Every day | ORAL | Status: AC

## 2011-10-04 MED ORDER — DOCUSATE SODIUM 100 MG PO CAPS: 100.0000 mg | ORAL_CAPSULE | Freq: Two times a day (BID) | ORAL | Status: AC | PRN

## 2011-10-04 MED ORDER — METRONIDAZOLE 500 MG PO TABS: 500.0000 mg | ORAL_TABLET | Freq: Two times a day (BID) | ORAL | Status: AC

## 2011-10-04 NOTE — ED Provider Notes (Signed)
Pt care assumed from NP [facility]. Lower back, suprapubic pain, dysuria, nausea. No motor or neuro deficit on exam. No pain meds given in ED, minimal pain on palpation to left lower back and suprapubic region on my exam. Pelvic exam demonstrates normal external genitalia. Small amt white vaginal d/c. Normal appearing cervix with closed cervical os. No CMT. No adnexal tenderness or mass, though exam is limited by body habitus. Labs with minimal leukocytosis. Chem panel unremarkable. U/a without hematuria, infection. Wet prep with evidence of bacterial vaginosis.  Also c/o constipation, no obstipation, hx single pelvic surgery. Abd plain film unremarkable. Advised stool softener and miralax. Repeat abd exams demonstrate no worsening pain, no peritoneal signs. Will tx for B.V. and advised PCP follow-up for persistent symptoms. Pt voices understanding.       Results for orders placed during the hospital encounter of 10/04/11  CBC      Component Value Range   WBC 11.0 (*) 4.0 - 10.5 (K/uL)   RBC 4.45  3.87 - 5.11 (MIL/uL)   Hemoglobin 12.9  12.0 - 15.0 (g/dL)   HCT 47.8  29.5 - 62.1 (%)   MCV 83.8  78.0 - 100.0 (fL)   MCH 29.0  26.0 - 34.0 (pg)   MCHC 34.6  30.0 - 36.0 (g/dL)   RDW 30.8  65.7 - 84.6 (%)   Platelets 370  150 - 400 (K/uL)  DIFFERENTIAL      Component Value Range   Neutrophils Relative 68  43 - 77 (%)   Neutro Abs 7.5  1.7 - 7.7 (K/uL)   Lymphocytes Relative 26  12 - 46 (%)   Lymphs Abs 2.8  0.7 - 4.0 (K/uL)   Monocytes Relative 5  3 - 12 (%)   Monocytes Absolute 0.6  0.1 - 1.0 (K/uL)   Eosinophils Relative 1  0 - 5 (%)   Eosinophils Absolute 0.1  0.0 - 0.7 (K/uL)   Basophils Relative 0  0 - 1 (%)   Basophils Absolute 0.0  0.0 - 0.1 (K/uL)  URINALYSIS, ROUTINE W REFLEX MICROSCOPIC      Component Value Range   Color, Urine YELLOW  YELLOW    APPearance CLEAR  CLEAR    Specific Gravity, Urine 1.018  1.005 - 1.030    pH 5.5  5.0 - 8.0    Glucose, UA NEGATIVE  NEGATIVE (mg/dL)     Hgb urine dipstick NEGATIVE  NEGATIVE    Bilirubin Urine NEGATIVE  NEGATIVE    Ketones, ur NEGATIVE  NEGATIVE (mg/dL)   Protein, ur NEGATIVE  NEGATIVE (mg/dL)   Urobilinogen, UA 0.2  0.0 - 1.0 (mg/dL)   Nitrite NEGATIVE  NEGATIVE    Leukocytes, UA NEGATIVE  NEGATIVE   POCT I-STAT, CHEM 8      Component Value Range   Sodium 139  135 - 145 (mEq/L)   Potassium 4.1  3.5 - 5.1 (mEq/L)   Chloride 105  96 - 112 (mEq/L)   BUN 16  6 - 23 (mg/dL)   Creatinine, Ser 9.62  0.50 - 1.10 (mg/dL)   Glucose, Bld 952 (*) 70 - 99 (mg/dL)   Calcium, Ion 8.41  3.24 - 1.32 (mmol/L)   TCO2 25  0 - 100 (mmol/L)   Hemoglobin 12.6  12.0 - 15.0 (g/dL)   HCT 40.1  02.7 - 25.3 (%)  WET PREP, GENITAL      Component Value Range   Yeast Wet Prep HPF POC NONE SEEN  NONE SEEN    Trich,  Wet Prep NONE SEEN  NONE SEEN    Clue Cells Wet Prep HPF POC MODERATE (*) NONE SEEN    WBC, Wet Prep HPF POC MODERATE (*) NONE SEEN    Dg Abd Acute W/chest  10/04/2011  *RADIOLOGY REPORT*  Clinical Data: Diffuse abdominal pain.  Constipation.  ACUTE ABDOMEN SERIES (ABDOMEN 2 VIEW & CHEST 1 VIEW)  Comparison: Chest x-ray dated 08/04/2011 and scout image for abdominal CT dated 07/21/2010  Findings: Heart and lungs are normal.  No free air or free fluid in the abdomen.  Bowel gas pattern is normal.  No acute osseous abnormality.  IMPRESSION: Benign-appearing abdomen and chest. No excessive stool in the colon.  Original Report Authenticated By: Gwynn Burly, M.D.      Shaaron Adler, PA-C 10/04/11 [address] [place], [place] 10/04/11 1024

## 2011-10-04 NOTE — Discharge Instructions (Signed)
Bacterial Vaginosis Bacterial vaginosis (BV) is a vaginal infection where the normal balance of bacteria in the vagina is disrupted. The normal balance is then replaced by an overgrowth of certain bacteria. There are several different kinds of bacteria that can cause BV. BV is the most common vaginal infection in women of childbearing age. CAUSES   The cause of BV is not fully understood. BV develops when there is an increase or imbalance of harmful bacteria.   Some activities or behaviors can upset the normal balance of bacteria in the vagina and put women at increased risk including:   Having a new sex partner or multiple sex partners.   Douching.   Using an intrauterine device (IUD) for contraception.   It is not clear what role sexual activity plays in the development of BV. However, women that have never had sexual intercourse are rarely infected with BV.  Women do not get BV from toilet seats, bedding, swimming pools or from touching objects around them.  SYMPTOMS   Grey vaginal discharge.   A fish-like odor with discharge, especially after sexual intercourse.   Itching or burning of the vagina and vulva.   Burning or pain with urination.   Some women have no signs or symptoms at all.  DIAGNOSIS  Your caregiver must examine the vagina for signs of BV. Your caregiver will perform lab tests and look at the sample of vaginal fluid through a microscope. They will look for bacteria and abnormal cells (clue cells), a pH test higher than 4.5, and a positive amine test all associated with BV.  RISKS AND COMPLICATIONS   Pelvic inflammatory disease (PID).   Infections following gynecology surgery.   Developing HIV.   Developing herpes virus.  TREATMENT  Sometimes BV will clear up without treatment. However, all women with symptoms of BV should be treated to avoid complications, especially if gynecology surgery is planned. Female partners generally do not need to be treated. However,  BV may spread between female sex partners so treatment is helpful in preventing a recurrence of BV.   BV may be treated with antibiotics. The antibiotics come in either pill or vaginal cream forms. Either can be used with nonpregnant or pregnant women, but the recommended dosages differ. These antibiotics are not harmful to the baby.   BV can recur after treatment. If this happens, a second round of antibiotics will often be prescribed.   Treatment is important for pregnant women. If not treated, BV can cause a premature delivery, especially for a pregnant woman who had a premature birth in the past. All pregnant women who have symptoms of BV should be checked and treated.   For chronic reoccurrence of BV, treatment with a type of prescribed gel vaginally twice a week is helpful.  HOME CARE INSTRUCTIONS   Finish all medication as directed by your caregiver.   Do not have sex until treatment is completed.   Tell your sexual partner that you have a vaginal infection. They should see their caregiver and be treated if they have problems, such as a mild rash or itching.   Practice safe sex. Use condoms. Only have 1 sex partner.  PREVENTION  Basic prevention steps can help reduce the risk of upsetting the natural balance of bacteria in the vagina and developing BV:  Do not have sexual intercourse (be abstinent).   Do not douche.   Use all of the medicine prescribed for treatment of BV, even if the signs and symptoms go away.     Tell your sex partner if you have BV. That way, they can be treated, if needed, to prevent reoccurrence.  SEEK MEDICAL CARE IF:   Your symptoms are not improving after 3 days of treatment.   You have increased discharge, pain, or fever.  MAKE SURE YOU:   Understand these instructions.   Will watch your condition.   Will get help right away if you are not doing well or get worse.  FOR MORE INFORMATION  Division of STD Prevention (DSTDP), Centers for Disease  Control and Prevention: SolutionApps.[employer].za American Social Health Association (ASHA): www.ashastd.org  Document Released: 05/05/2005 Document Revised: 04/24/2011 Document Reviewed: 10/26/2008 [facility] Patient Information 2012 [place], [place].          Back Pain, Adult Low back pain is very common. About 1 in 5 people have back pain.The cause of low back pain is rarely dangerous. The pain often gets better over time.About half of people with a sudden onset of back pain feel better in just 2 weeks. About 8 in 10 people feel better by 6 weeks.  CAUSES Some common causes of back pain include:  Strain of the muscles or ligaments supporting the spine.   Wear and tear (degeneration) of the spinal discs.   Arthritis.   Direct injury to the back.  DIAGNOSIS Most of the time, the direct cause of low back pain is not known.However, back pain can be treated effectively even when the exact cause of the pain is unknown.Answering your caregiver's questions about your overall health and symptoms is one of the most accurate ways to make sure the cause of your pain is not dangerous. If your caregiver needs more information, he or she may order lab work or imaging tests (X-rays or MRIs).However, even if imaging tests show changes in your back, this usually does not require surgery. HOME CARE INSTRUCTIONS For many people, back pain returns.Since low back pain is rarely dangerous, it is often a condition that people can learn to [facility] their own.   Remain active. It is stressful on the back to sit or stand in one place. Do not sit, drive, or stand in one place for more than 30 minutes at a time. Take short walks on level surfaces as soon as pain allows.Try to increase the length of time you walk each day.   Do not stay in bed.Resting more than 1 or 2 days can delay your recovery.   Do not avoid exercise or work.Your body is made to move.It is not dangerous to be active, even though your back  may hurt.Your back will likely heal faster if you return to being active before your pain is gone.   Pay attention to your body when you bend and lift. Many people have less discomfortwhen lifting if they bend their knees, keep the load close to their bodies,and avoid twisting. Often, the most comfortable positions are those that put less stress on your recovering back.   Find a comfortable position to sleep. Use a firm mattress and lie on your side with your knees slightly bent. If you lie on your back, put a pillow under your knees.   Only take over-the-counter or prescription medicines as directed by your caregiver. Over-the-counter medicines to reduce pain and inflammation are often the most helpful.Your caregiver may prescribe muscle relaxant drugs.These medicines help dull your pain so you can more quickly return to your normal activities and healthy exercise.   Put ice on the injured area.   Put ice in  a plastic bag.   Place a towel between your skin and the bag.   Leave the ice on for 15 to 20 minutes, 3 to 4 times a day for the first 2 to 3 days. After that, ice and heat may be alternated to reduce pain and spasms.   Ask your caregiver about trying back exercises and gentle massage. This may be of some benefit.   Avoid feeling anxious or stressed.Stress increases muscle tension and can worsen back pain.It is important to recognize when you are anxious or stressed and learn ways to manage it.Exercise is a great option.  SEEK MEDICAL CARE IF:  You have pain that is not relieved with rest or medicine.   You have pain that does not improve in 1 week.   You have new symptoms.   You are generally not feeling well.  SEEK IMMEDIATE MEDICAL CARE IF:   You have pain that radiates from your back into your legs.   You develop new bowel or bladder control problems.   You have unusual weakness or numbness in your arms or legs.   You develop nausea or vomiting.   You develop  abdominal pain.   You feel faint.  Document Released: 05/05/2005 Document Revised: 04/24/2011 Document Reviewed: 09/23/2010 [facility] Patient Information 2012 [place], [place].         Constipation in Adults Constipation is having fewer than 2 bowel movements per week. Usually, the stools are hard. As we grow older, constipation is more common. If you try to fix constipation with laxatives, the problem may get worse. This is because laxatives taken over a long period of time make the colon muscles weaker. A low-fiber diet, not taking in enough fluids, and taking some medicines may make these problems worse. MEDICATIONS THAT MAY CAUSE CONSTIPATION  Water pills (diuretics).   Calcium channel blockers (used to control blood pressure and for the heart).   Certain pain medicines (narcotics).   Anticholinergics.   Anti-inflammatory agents.   Antacids that contain aluminum.  DISEASES THAT CONTRIBUTE TO CONSTIPATION  Diabetes.   Parkinson's disease.   Dementia.   Stroke.   Depression.   Illnesses that cause problems with salt and water metabolism.  HOME CARE INSTRUCTIONS   Constipation is usually best cared for without medicines. Increasing dietary fiber and eating more fruits and vegetables is the best way to manage constipation.   Slowly increase fiber intake to 25 to 38 grams per day. Whole grains, fruits, vegetables, and legumes are good sources of fiber. A dietitian can further help you incorporate high-fiber foods into your diet.   Drink enough water and fluids to keep your urine clear or pale yellow.   A fiber supplement may be added to your diet if you cannot get enough fiber from foods.   Increasing your activities also helps improve regularity.   Suppositories, as suggested by your caregiver, will also help. If you are using antacids, such as aluminum or calcium containing products, it will be helpful to switch to products containing magnesium if your caregiver  says it is okay.   If you have been given a liquid injection (enema) today, this is only a temporary measure. It should not be relied on for treatment of longstanding (chronic) constipation.   Stronger measures, such as magnesium sulfate, should be avoided if possible. This may cause uncontrollable diarrhea. Using magnesium sulfate may not allow you time to make it to the bathroom.  SEEK IMMEDIATE MEDICAL CARE IF:   There is  bright red blood in the stool.   The constipation stays for more than 4 days.   There is belly (abdominal) or rectal pain.   You do not seem to be getting better.   You have any questions or concerns.  MAKE SURE YOU:   Understand these instructions.   Will watch your condition.   Will get help right away if you are not doing well or get worse.  Document Released: 02/01/2004 Document Revised: 04/24/2011 Document Reviewed: 04/08/2011 [facility] Patient Information 2012 [place], [place].

## 2011-10-04 NOTE — ED Notes (Signed)
Patient states she has had pain in her lower back, legs and abdominal pain since yesterday. Denies n/v/d. States she has not had a bowel movement in the past 2 days. States she took laxative without relief.

## 2011-10-04 NOTE — ED Provider Notes (Signed)
History     CSN: [id number]  Arrival date & time 10/04/11  4098   First MD Initiated Contact with Patient 10/04/11 [phone]      Chief Complaint  Patient presents with  . Back Pain  . Abdominal Pain    (Consider location/radiation/quality/duration/timing/severity/associated sxs/prior treatment) Patient is a 51 y.o. female presenting with back pain and abdominal pain. The history is provided by the patient.  Back Pain  This is a new problem. The current episode started yesterday. The problem occurs constantly. The problem has been gradually worsening. The pain is associated with no known injury. The pain is present in the lumbar spine. The pain is at a severity of 4/10. The pain is moderate. Associated symptoms include abdominal pain and dysuria. Pertinent negatives include no fever, no numbness, no headaches and no weakness.  Abdominal Pain The primary symptoms of the illness include abdominal pain, nausea and dysuria. The primary symptoms of the illness do not include fever, shortness of breath, vomiting, diarrhea, vaginal discharge or vaginal bleeding.  The dysuria is associated with frequency. The dysuria is not associated with vaginal pain.  Additional symptoms associated with the illness include frequency and back pain.    Past Medical History  Diagnosis Date  . High cholesterol   . Phlebitis   . Asthma   . Chronic bronchitis     "have it whenever I have a cold"  . Shortness of breath on exertion   . Diabetes mellitus type 2 in obese   . Herpes simplex     "type 1 & 2"  . GERD (gastroesophageal reflux disease)   . Headache   . Angina   . Dysrhythmia 08/03/11    "palpitations"  . Hypertension     Past Surgical History  Procedure Date  . Salpingectomy     w/ovary removal; "right"  . Salpingoophorectomy     No family history on file.  History  Substance Use Topics  . Smoking status: Never Smoker   . Smokeless tobacco: Never Used  . Alcohol Use: No    OB  History    Grav Para Term Preterm Abortions TAB SAB Ect Mult Living                  Review of Systems  Constitutional: Negative for fever, activity change and appetite change.  Respiratory: Positive for cough. Negative for shortness of breath.   Gastrointestinal: Positive for nausea and abdominal pain. Negative for vomiting and diarrhea.  Genitourinary: Positive for dysuria and frequency. Negative for vaginal bleeding, vaginal discharge and vaginal pain.  Musculoskeletal: Positive for back pain.  Neurological: Negative for dizziness, weakness, numbness and headaches.    Allergies  Hydrocodone  Home Medications   Current Outpatient Rx  Name Route Sig Dispense Refill  . ASPIRIN EC 81 MG PO TBEC Oral Take 81 mg by mouth daily.    Marland Kitchen FEXOFENADINE HCL 180 MG PO TABS Oral Take 180 mg by mouth daily.    . IBUPROFEN 200 MG PO TABS Oral Take 200-400 mg by mouth every 6 (six) hours as needed. For pain    . INSULIN GLARGINE 100 UNIT/ML [place] SOLN Subcutaneous Inject 80 Units into the skin daily.    . INSULIN GLULISINE 100 UNIT/ML [place] SOLN Subcutaneous Inject 20 Units into the skin 2 (two) times daily.     Marland Kitchen LISINOPRIL 10 MG PO TABS Oral Take 10 mg by mouth daily.    . CENTRUM PO Oral Take 1 tablet by mouth daily.  50 PLUS CENTRUM    . OMEPRAZOLE 20 MG PO CPDR Oral Take 20 mg by mouth daily as needed. For shortness of breath    . SIMVASTATIN 40 MG PO TABS Oral Take 40 mg by mouth every evening.    Marland Kitchen DOCUSATE SODIUM 100 MG PO CAPS Oral Take 1 capsule (100 mg total) by mouth 2 (two) times daily as needed for constipation. 60 capsule 0  . METRONIDAZOLE 500 MG PO TABS Oral Take 1 tablet (500 mg total) by mouth 2 (two) times daily. 14 tablet 0  . POLYETHYLENE GLYCOL 3350 PO POWD Oral Take 17 g by mouth daily. As needed for constipation 255 g 0    BP 155/78  Pulse 110  Temp(Src) 98.1 F (36.7 C) (Oral)  Resp 20  SpO2 97%  LMP 04/22/2011  Physical Exam  Constitutional: She is oriented to person,  place, and time. She appears well-developed and well-nourished.  HENT:  Head: Normocephalic.  Eyes: Pupils are equal, round, and reactive to light.  Neck: Normal range of motion.  Cardiovascular: Tachycardia present.   Pulmonary/Chest: Effort normal and breath sounds normal. She has no wheezes. She has no rales.  Abdominal: Soft. Bowel sounds are normal. There is tenderness in the suprapubic area.  Musculoskeletal: Normal range of motion. She exhibits no tenderness.       Back:  Neurological: She is alert and oriented to person, place, and time.  Skin: Skin is warm and dry.    ED Course  Procedures (including critical care time)  Labs Reviewed  CBC - Abnormal; Notable for the following:    WBC 11.0 (*)    All other components within normal limits  POCT I-STAT, CHEM 8 - Abnormal; Notable for the following:    Glucose, Bld 139 (*)    All other components within normal limits  WET PREP, GENITAL - Abnormal; Notable for the following:    Clue Cells Wet Prep HPF POC MODERATE (*)    WBC, Wet Prep HPF POC MODERATE (*)    All other components within normal limits  DIFFERENTIAL  URINALYSIS, ROUTINE W REFLEX MICROSCOPIC  GC/CHLAMYDIA PROBE AMP, GENITAL   Dg Abd Acute W/chest  10/04/2011  *RADIOLOGY REPORT*  Clinical Data: Diffuse abdominal pain.  Constipation.  ACUTE ABDOMEN SERIES (ABDOMEN 2 VIEW & CHEST 1 VIEW)  Comparison: Chest x-ray dated 08/04/2011 and scout image for abdominal CT dated 07/21/2010  Findings: Heart and lungs are normal.  No free air or free fluid in the abdomen.  Bowel gas pattern is normal.  No acute osseous abnormality.  IMPRESSION: Benign-appearing abdomen and chest. No excessive stool in the colon.  Original Report Authenticated By: Gwynn Burly, M.D.     1. Bacterial vaginosis   2. Back pain   3. Constipation       MDM  Think this is most likely a UTI, patient has no Hx of kidney stone, LBP          Arman Filter, NP 10/04/11 1956

## 2011-10-06 ENCOUNTER — Encounter (HOSPITAL_COMMUNITY): Payer: Self-pay

## 2011-10-06 ENCOUNTER — Emergency Department (HOSPITAL_COMMUNITY)
Admission: EM | Admit: 2011-10-06 | Discharge: 2011-10-06 | Disposition: A | Discharge status: Home / Self Care | Payer: BC Managed Care – PPO | Attending: Emergency Medicine | Admitting: Emergency Medicine

## 2011-10-06 DIAGNOSIS — R209 Unspecified disturbances of skin sensation: Secondary | ICD-10-CM | POA: Insufficient documentation

## 2011-10-06 DIAGNOSIS — K5900 Constipation, unspecified: Secondary | ICD-10-CM | POA: Insufficient documentation

## 2011-10-06 DIAGNOSIS — I10 Essential (primary) hypertension: Secondary | ICD-10-CM | POA: Insufficient documentation

## 2011-10-06 DIAGNOSIS — M545 Low back pain, unspecified: Secondary | ICD-10-CM | POA: Insufficient documentation

## 2011-10-06 DIAGNOSIS — M549 Dorsalgia, unspecified: Secondary | ICD-10-CM

## 2011-10-06 DIAGNOSIS — J45909 Unspecified asthma, uncomplicated: Secondary | ICD-10-CM | POA: Insufficient documentation

## 2011-10-06 DIAGNOSIS — E7800 Pure hypercholesterolemia, unspecified: Secondary | ICD-10-CM | POA: Insufficient documentation

## 2011-10-06 DIAGNOSIS — K219 Gastro-esophageal reflux disease without esophagitis: Secondary | ICD-10-CM | POA: Insufficient documentation

## 2011-10-06 DIAGNOSIS — R109 Unspecified abdominal pain: Secondary | ICD-10-CM | POA: Insufficient documentation

## 2011-10-06 DIAGNOSIS — E119 Type 2 diabetes mellitus without complications: Secondary | ICD-10-CM | POA: Insufficient documentation

## 2011-10-06 DIAGNOSIS — M79609 Pain in unspecified limb: Secondary | ICD-10-CM | POA: Insufficient documentation

## 2011-10-06 LAB — GC/CHLAMYDIA PROBE AMP, GENITAL
Chlamydia, DNA Probe: NEGATIVE
GC Probe Amp, Genital: NEGATIVE

## 2011-10-06 MED ORDER — TRAMADOL HCL 50 MG PO TABS: 50.0000 mg | ORAL_TABLET | Freq: Four times a day (QID) | ORAL | Status: AC | PRN

## 2011-10-06 MED ORDER — IBUPROFEN 800 MG PO TABS: 800.0000 mg | ORAL_TABLET | Freq: Three times a day (TID) | ORAL | Status: AC | PRN

## 2011-10-06 NOTE — ED Notes (Signed)
Pt presents with no acute distress- TX and discharged with RX.  Pt compliant with meds- c/o of constipation enema given at home with some relief.  Back pain radiates to rt knee only numbness to rt leg only.  Denies loss of function with bowel and bladder

## 2011-10-06 NOTE — ED Provider Notes (Signed)
Medical screening examination/treatment/procedure(s) were performed by non-physician practitioner and as supervising physician I was immediately available for consultation/collaboration.   Monicka Cyran, MD 10/06/11 0406 

## 2011-10-06 NOTE — Discharge Instructions (Signed)
Please read and follow all provided instructions.  Your diagnoses today include:  1. Back pain   2. Constipation   3. Abdominal pain     Tests performed today include:  Vital signs - see below for your results today  Medications prescribed:   Ultram (tramadol) - narcotic pain medication  You have been prescribed narcotic pain medication such as Vicodin or Percocet: DO NOT drive or perform any activities that require you to be awake and alert because this medicine can make you drowsy. BE VERY CAREFUL not to take multiple medicines containing Tylenol (also called acetaminophen). Doing so can lead to an overdose which can damage your liver and cause liver failure and possibly death.    Ibuprofen - anti-inflammatory pain medication  Do not exceed 800mg  ibuprofen every 8 hours  You have been prescribed an anti-inflammatory medication or NSAID. Take with food. Take smallest effective dose for the shortest duration needed for your pain. Stop taking if you experience stomach pain or vomiting.   Take any prescribed medications only as directed.  Home care instructions:   Follow any educational materials contained in this packet  Please rest, use ice or heat on your back for the next several days  Do not lift, push, pull anything more than 10 pounds for the next week  Continue to take antibiotics as previously prescribed, continue miralax and stool softener for constipation  Follow-up instructions: Please follow-up with your primary care provider in the next 1 week for further evaluation of your symptoms. If you do not have a primary care doctor -- see below for referral information.   Return instructions:  SEEK IMMEDIATE MEDICAL ATTENTION IF YOU HAVE:  New numbness, tingling, weakness, or problem with the use of your arms or legs  Severe back pain not relieved with medications  Loss control of your bowels or bladder  Increasing pain in any areas of the body (such as chest or  abdominal pain)  Shortness of breath, dizziness, or fainting.   Worsening nausea (feeling sick to your stomach), vomiting, fever, or sweats  Any other emergent concerns regarding your health   Additional Information:  Your vital signs today were: BP 156/83  Pulse 106  Temp(Src) 98.2 F (36.8 C) (Oral)  Resp 16  SpO2 96%  LMP 04/22/2011 If your blood pressure (BP) was elevated above 135/85 this visit, please have this repeated by your doctor within one month. -------------- No Primary Care Doctor Call [facility]  [phone] Other agencies that provide inexpensive medical care    [facility]  [phone]    [facility]  [phone]    [facility]  [phone]    [facility]  [phone]    [facility]  [phone]    [facility]  [phone] -------------- RESOURCE GUIDE:  Dental Problems  Patients with Medicaid: [facility] [phone] [address]                                            [phone] Phone:  [phone]  Phone:  [phone]  If unable to pay or uninsured, contact:  [facility] or [facility]. to become qualified for the adult dental clinic.  Chronic Pain Problems Contact [facility]  [phone] Patients need to be referred by their primary care doctor.  Insufficient Money for Medicine Contact [employer]:  call "211" or [facility] [phone].  Psychological Services [facility]  [phone] [facility]  [phone] [facility]   [phone] (emergency services [phone])  Substance Abuse Resources [facility]  [phone] [facility] [phone] [facility] [phone] Floydene Flock [phone] Residential & Outpatient Substance Abuse Program  [phone]  Abuse/Neglect [facility]  Child Abuse Hotline [phone] [facility] Child Abuse Hotline [phone] (After Hours)  Emergency Shelter [facility] Minist[facility] [phone]  Maternity Homes [facility] [place] [facility] [phone] [facility] [phone]  [facility] Resources  [facility] [place]     [employer]                          [facility] Dept. [address] [place]                       [address]      [address]  Blondell Reveal Phone:  [phone]                                   Phone:  [phone]                 Phone:  [phone]  [facility] Phone:  [phone]  [facility] Child Abuse Hotline [phone] [phone] (After Hours)

## 2011-10-06 NOTE — ED Provider Notes (Signed)
History     CSN: [id number]  Arrival date & time 10/06/11  0210   First MD Initiated Contact with Patient 10/06/11 [phone]      Chief Complaint  Patient presents with  . Back Pain  . Numbness  . Abdominal Pain    (Consider location/radiation/quality/duration/timing/severity/associated sxs/prior treatment) HPI Comments: Patient presents with multiple complaints:  Back pain and right leg pain and numbness: patient describes as a squeezing to thigh with numbness R lateral thigh. She also has R lower back pain. No weakness or difficulty walking. No red flag s/s of back pain. No treatments prior. Nothing makes pain better or worse. States it feels same as when she had shingles on left thigh in past but no rash.   Abdominal pain: same pain as seen for in ED two days ago. She is taking metronidazole. No fever, N/V/D, urinary symptoms.   Constipation: Symptoms for one week. Taking miralax and stool softeners. Did an enema yesterday with good results.   Patient is a 51 y.o. female presenting with abdominal pain and leg pain. The history is provided by the patient.  Abdominal Pain The primary symptoms of the illness include abdominal pain. The primary symptoms of the illness do not include fever, nausea, vomiting, diarrhea, dysuria, vaginal discharge or vaginal bleeding.  Additional symptoms associated with the illness include constipation and back pain. Symptoms associated with the illness do not include hematuria.  Leg Pain  The incident occurred yesterday. There was no injury mechanism. The pain is present in the right thigh. Quality: squeezing. The pain has been constant since onset. Associated symptoms include numbness. Pertinent negatives include no inability to bear weight, no loss of motion and no muscle weakness. The symptoms are aggravated by nothing. She has tried nothing for the symptoms.    Past Medical History  Diagnosis Date  . High cholesterol   . Phlebitis   . Asthma   .  Chronic bronchitis     "have it whenever I have a cold"  . Shortness of breath on exertion   . Diabetes mellitus type 2 in obese   . Herpes simplex     "type 1 & 2"  . GERD (gastroesophageal reflux disease)   . Headache   . Angina   . Dysrhythmia 08/03/11    "palpitations"  . Hypertension     Past Surgical History  Procedure Date  . Salpingectomy     w/ovary removal; "right"  . Salpingoophorectomy     No family history on file.  History  Substance Use Topics  . Smoking status: Never Smoker   . Smokeless tobacco: Never Used  . Alcohol Use: No    OB History    Grav Para Term Preterm Abortions TAB SAB Ect Mult Living                  Review of Systems  Constitutional: Negative for fever and unexpected weight change.  HENT: Negative for sore throat and rhinorrhea.   Eyes: Negative for redness.  Respiratory: Negative for cough.   Cardiovascular: Negative for chest pain.  Gastrointestinal: Positive for abdominal pain and constipation. Negative for nausea, vomiting, diarrhea and blood in stool.       Negative for fecal incontinence.   Genitourinary: Negative for dysuria, hematuria, flank pain, vaginal bleeding, vaginal discharge and pelvic pain.       Negative for urinary incontinence or retention.  Musculoskeletal: Positive for back pain and arthralgias. Negative for myalgias.  Skin: Negative for rash.  Neurological: Positive for numbness. Negative for weakness and headaches.       Denies saddle paresthesias.    Allergies  Hydrocodone  Home Medications   Current Outpatient Rx  Name Route Sig Dispense Refill  . ACETAMINOPHEN 500 MG PO TABS Oral Take 500 mg by mouth every 6 (six) hours as needed. For pain    . ASPIRIN EC 81 MG PO TBEC Oral Take 81 mg by mouth daily.    Marland Kitchen DOCUSATE SODIUM 100 MG PO CAPS Oral Take 1 capsule (100 mg total) by mouth 2 (two) times daily as needed for constipation. 60 capsule 0  . FEXOFENADINE HCL 180 MG PO TABS Oral Take 180 mg by mouth  daily.    . IBUPROFEN 200 MG PO TABS Oral Take 200-400 mg by mouth every 6 (six) hours as needed. For pain    . INSULIN GLARGINE 100 UNIT/ML [place] SOLN Subcutaneous Inject 80 Units into the skin daily.    . INSULIN GLULISINE 100 UNIT/ML [place] SOLN Subcutaneous Inject 20 Units into the skin 2 (two) times daily.     Marland Kitchen LISINOPRIL 10 MG PO TABS Oral Take 10 mg by mouth daily.    Marland Kitchen METRONIDAZOLE 500 MG PO TABS Oral Take 1 tablet (500 mg total) by mouth 2 (two) times daily. 14 tablet 0  . CENTRUM PO Oral Take 1 tablet by mouth daily. 50 PLUS CENTRUM    . OMEPRAZOLE 20 MG PO CPDR Oral Take 20 mg by mouth daily as needed. For shortness of breath    . POLYETHYLENE GLYCOL 3350 PO POWD Oral Take 17 g by mouth daily. As needed for constipation 255 g 0  . SIMVASTATIN 40 MG PO TABS Oral Take 40 mg by mouth every evening.      BP 156/83  Pulse 106  Temp(Src) 98.2 F (36.8 C) (Oral)  Resp 16  SpO2 96%  LMP 04/22/2011  Physical Exam  Nursing note and vitals reviewed. Constitutional: She appears well-developed and well-nourished.  HENT:  Head: Normocephalic and atraumatic.  Eyes: Conjunctivae are normal.  Neck: Normal range of motion. Neck supple.  Pulmonary/Chest: Effort normal. No respiratory distress.  Abdominal: Soft. Bowel sounds are normal. She exhibits no distension. There is no tenderness. There is no rebound, no guarding and no CVA tenderness.  Musculoskeletal: Normal range of motion.       Cervical back: Normal.       Thoracic back: Normal.       Lumbar back: She exhibits tenderness. She exhibits no bony tenderness.       No step-off noted with palpation of spine.   Neurological: She is alert. She has normal strength and normal reflexes. A sensory deficit is present. No cranial nerve deficit. Coordination normal. GCS eye subscore is 4. GCS verbal subscore is 5. GCS motor subscore is 6.       5/5 strength in entire lower extremities bilaterally. Patient reports dull sensation on right thigh to  knee. No skin findings. No rash.   Skin: Skin is warm and dry. No rash noted.  Psychiatric: She has a normal mood and affect.    ED Course  Procedures (including critical care time)  Labs Reviewed - No data to display Dg Abd Acute W/chest  10/04/2011  *RADIOLOGY REPORT*  Clinical Data: Diffuse abdominal pain.  Constipation.  ACUTE ABDOMEN SERIES (ABDOMEN 2 VIEW & CHEST 1 VIEW)  Comparison: Chest x-ray dated 08/04/2011 and scout image for abdominal CT dated 07/21/2010  Findings: Heart and lungs are  normal.  No free air or free fluid in the abdomen.  Bowel gas pattern is normal.  No acute osseous abnormality.  IMPRESSION: Benign-appearing abdomen and chest. No excessive stool in the colon.  Original Report Authenticated By: Gwynn Burly, M.D.     1. Back pain   2. Constipation   3. Abdominal pain     3:42 AM Patient seen and examined.   Vital signs reviewed and are as follows: Filed Vitals:   10/06/11 0215  BP: 156/83  Pulse: 106  Temp: 98.2 F (36.8 C)  Resp: 16    No red flag s/s of low back pain. Patient was counseled on back pain precautions and told to do activity as tolerated but do not lift, push, or pull heavy objects more than 10 pounds for the next week.  Patient counseled to use ice or heat on back for no longer than 15 minutes every hour.   Patient prescribed muscle relaxer and counseled on proper use of muscle relaxant medication.    Patient prescribed narcotic pain medicine and counseled on proper use of narcotic pain medications. Counseled not to combine this medication with others containing tylenol.   Urged patient not to drink alcohol, drive, or perform any other activities that requires focus while taking either of these medications.  Patient urged to follow-up with PCP if pain does not improve with treatment and rest or if pain becomes recurrent. Urged to return with worsening severe pain, loss of bowel or bladder control, trouble walking.   The patient  verbalizes understanding and agrees with the plan.     MDM  Patient with back pain. No neurological deficits. Patient is ambulatory. No warning symptoms of back pain including: loss of bowel or bladder control, night sweats, waking from sleep with back pain, unexplained fevers or weight loss, h/o cancer, IVDU, recent trauma. No concern for cauda equina, epidural abscess, or other serious cause of back pain. Conservative measures such as rest, ice/heat and pain medicine indicated with PCP follow-up if no improvement with conservative management.   Abdominal pain and constipation: worked up prior and unchanged. Patient to continue previous therapy and f/u with PCP as needed.          [place], [place] 10/09/11 [phone]

## 2011-10-06 NOTE — ED Provider Notes (Signed)
Medical screening examination/treatment/procedure(s) were performed by non-physician practitioner and as supervising physician I was immediately available for consultation/collaboration.   Gerda Yin, MD 10/06/11 0406 

## 2011-10-09 NOTE — ED Provider Notes (Signed)
Medical screening examination/treatment/procedure(s) were performed by non-physician practitioner and as supervising physician I was immediately available for consultation/collaboration.  Aliza Moret, MD 10/09/11 0800 

## 2011-10-23 ENCOUNTER — Other Ambulatory Visit: Payer: Self-pay | Admitting: Family Medicine

## 2011-10-23 DIAGNOSIS — M545 Low back pain, unspecified: Secondary | ICD-10-CM

## 2011-10-25 ENCOUNTER — Other Ambulatory Visit: Payer: BC Managed Care – PPO

## 2011-10-26 ENCOUNTER — Ambulatory Visit
Admission: RE | Admit: 2011-10-26 | Discharge: 2011-10-26 | Disposition: A | Discharge status: Home / Self Care | Payer: BC Managed Care – PPO | Source: Ambulatory Visit | Attending: Family Medicine | Admitting: Family Medicine

## 2011-10-26 DIAGNOSIS — M545 Low back pain, unspecified: Secondary | ICD-10-CM

## 2012-08-11 ENCOUNTER — Other Ambulatory Visit: Payer: Self-pay | Admitting: Obstetrics

## 2012-08-11 DIAGNOSIS — Z1231 Encounter for screening mammogram for malignant neoplasm of breast: Secondary | ICD-10-CM

## 2012-09-21 ENCOUNTER — Ambulatory Visit
Admission: RE | Admit: 2012-09-21 | Discharge: 2012-09-21 | Disposition: A | Discharge status: Home / Self Care | Payer: BC Managed Care – PPO | Source: Ambulatory Visit | Attending: Obstetrics | Admitting: Obstetrics

## 2012-09-21 DIAGNOSIS — Z1231 Encounter for screening mammogram for malignant neoplasm of breast: Secondary | ICD-10-CM

## 2012-12-09 ENCOUNTER — Emergency Department (HOSPITAL_COMMUNITY)
Admission: EM | Admit: 2012-12-09 | Discharge: 2012-12-09 | Disposition: A | Discharge status: Home / Self Care | Payer: BC Managed Care – PPO | Attending: Emergency Medicine | Admitting: Emergency Medicine

## 2012-12-09 ENCOUNTER — Emergency Department (HOSPITAL_COMMUNITY): Payer: BC Managed Care – PPO

## 2012-12-09 ENCOUNTER — Encounter (HOSPITAL_COMMUNITY): Payer: Self-pay

## 2012-12-09 DIAGNOSIS — Z79899 Other long term (current) drug therapy: Secondary | ICD-10-CM | POA: Insufficient documentation

## 2012-12-09 DIAGNOSIS — Z8679 Personal history of other diseases of the circulatory system: Secondary | ICD-10-CM | POA: Insufficient documentation

## 2012-12-09 DIAGNOSIS — Z794 Long term (current) use of insulin: Secondary | ICD-10-CM | POA: Insufficient documentation

## 2012-12-09 DIAGNOSIS — Z8709 Personal history of other diseases of the respiratory system: Secondary | ICD-10-CM | POA: Insufficient documentation

## 2012-12-09 DIAGNOSIS — Z8672 Personal history of thrombophlebitis: Secondary | ICD-10-CM | POA: Insufficient documentation

## 2012-12-09 DIAGNOSIS — R071 Chest pain on breathing: Secondary | ICD-10-CM | POA: Insufficient documentation

## 2012-12-09 DIAGNOSIS — Z7982 Long term (current) use of aspirin: Secondary | ICD-10-CM | POA: Insufficient documentation

## 2012-12-09 DIAGNOSIS — E7800 Pure hypercholesterolemia, unspecified: Secondary | ICD-10-CM | POA: Insufficient documentation

## 2012-12-09 DIAGNOSIS — R002 Palpitations: Secondary | ICD-10-CM | POA: Insufficient documentation

## 2012-12-09 DIAGNOSIS — E119 Type 2 diabetes mellitus without complications: Secondary | ICD-10-CM | POA: Insufficient documentation

## 2012-12-09 DIAGNOSIS — J45909 Unspecified asthma, uncomplicated: Secondary | ICD-10-CM | POA: Insufficient documentation

## 2012-12-09 DIAGNOSIS — I10 Essential (primary) hypertension: Secondary | ICD-10-CM | POA: Insufficient documentation

## 2012-12-09 DIAGNOSIS — Z9079 Acquired absence of other genital organ(s): Secondary | ICD-10-CM | POA: Insufficient documentation

## 2012-12-09 DIAGNOSIS — R1013 Epigastric pain: Secondary | ICD-10-CM | POA: Insufficient documentation

## 2012-12-09 LAB — BASIC METABOLIC PANEL
BUN: 17 mg/dL (ref 6–23)
CO2: 27 mEq/L (ref 19–32)
Calcium: 9.5 mg/dL (ref 8.4–10.5)
Chloride: 99 mEq/L (ref 96–112)
Creatinine, Ser: 0.78 mg/dL (ref 0.50–1.10)
GFR calc Af Amer: >90 mL/min (ref >90)
GFR calc non Af Amer: >90 mL/min (ref >90)
Glucose, Bld: 165 mg/dL — ABNORMAL HIGH (ref 70–99)
Potassium: 4.2 mEq/L (ref 3.5–5.1)
Sodium: 136 mEq/L (ref 135–145)

## 2012-12-09 LAB — POCT I-STAT TROPONIN I: Troponin i, poc: 0 ng/mL (ref 0.00–0.08)

## 2012-12-09 LAB — CBC WITH DIFFERENTIAL/PLATELET
Basophils Absolute: 0 10*3/uL (ref 0.0–0.1)
Basophils Relative: 0 % (ref 0–1)
Eosinophils Absolute: 0.1 10*3/uL (ref 0.0–0.7)
Eosinophils Relative: 0 % (ref 0–5)
HCT: 38.1 % (ref 36.0–46.0)
Hemoglobin: 13.7 g/dL (ref 12.0–15.0)
Lymphocytes Relative: 20 % (ref 12–46)
Lymphs Abs: 2.8 10*3/uL (ref 0.7–4.0)
MCH: 29.7 pg (ref 26.0–34.0)
MCHC: 36 g/dL (ref 30.0–36.0)
MCV: 82.5 fL (ref 78.0–100.0)
Monocytes Absolute: 0.6 10*3/uL (ref 0.1–1.0)
Monocytes Relative: 5 % (ref 3–12)
Neutro Abs: 10.5 10*3/uL — ABNORMAL HIGH (ref 1.7–7.7)
Neutrophils Relative %: 75 % (ref 43–77)
Platelets: 376 10*3/uL (ref 150–400)
RBC: 4.62 MIL/uL (ref 3.87–5.11)
RDW: 12.6 % (ref 11.5–15.5)
WBC: 14 10*3/uL — ABNORMAL HIGH (ref 4.0–10.5)

## 2012-12-09 LAB — URINALYSIS, ROUTINE W REFLEX MICROSCOPIC
Bilirubin Urine: NEGATIVE
Glucose, UA: NEGATIVE mg/dL
Hgb urine dipstick: NEGATIVE
Ketones, ur: NEGATIVE mg/dL
Leukocytes, UA: NEGATIVE
Nitrite: NEGATIVE
Protein, ur: NEGATIVE mg/dL
Specific Gravity, Urine: 1.01 (ref 1.005–1.030)
Urobilinogen, UA: 0.2 mg/dL (ref 0.0–1.0)
pH: 5.5 (ref 5.0–8.0)

## 2012-12-09 LAB — HEPATIC FUNCTION PANEL
ALT: 18 U/L (ref 0–35)
AST: 16 U/L (ref 0–37)
Albumin: 3.6 g/dL (ref 3.5–5.2)
Alkaline Phosphatase: 162 U/L — ABNORMAL HIGH (ref 39–117)
Bilirubin, Direct: <0.1 mg/dL (ref 0.0–0.3)
Total Bilirubin: 0.3 mg/dL (ref 0.3–1.2)
Total Protein: 7.7 g/dL (ref 6.0–8.3)

## 2012-12-09 LAB — LIPASE, BLOOD: Lipase: 19 U/L (ref 11–59)

## 2012-12-09 MED ORDER — GI COCKTAIL ~~LOC~~
30.0000 mL | Freq: Once | ORAL | Status: AC
  Administered 2012-12-09: 30 mL via ORAL
  Filled 2012-12-09: qty 30

## 2012-12-09 MED ORDER — OMEPRAZOLE 20 MG PO CPDR: 20.0000 mg | DELAYED_RELEASE_CAPSULE | Freq: Every day | ORAL | Status: DC

## 2012-12-09 MED ORDER — PANTOPRAZOLE SODIUM 40 MG IV SOLR
40.0000 mg | Freq: Once | INTRAVENOUS | Status: AC
  Administered 2012-12-09: 40 mg via INTRAVENOUS
  Filled 2012-12-09: qty 40

## 2012-12-09 MED ORDER — ONDANSETRON HCL 4 MG/2ML IJ SOLN
4.0000 mg | Freq: Once | INTRAMUSCULAR | Status: AC
  Administered 2012-12-09: 4 mg via INTRAVENOUS
  Filled 2012-12-09: qty 2

## 2012-12-09 MED ORDER — FAMOTIDINE 20 MG PO TABS: 20.0000 mg | ORAL_TABLET | Freq: Two times a day (BID) | ORAL | Status: DC

## 2012-12-09 NOTE — ED Provider Notes (Signed)
CSN: [id number]     Arrival date & time 12/09/12  1426 History     First MD Initiated Contact with Patient 12/09/12 1530     No chief complaint on file.  (Consider location/radiation/quality/duration/timing/severity/associated sxs/prior Treatment) HPI  52 year old female, history of GERD, hypertension, dysrhythmia, and asthma presents complaining of chest pain. Patient reports intermittent epigastric abdominal pain and central chest pain for the past 3 or 4 days. Describe pain as a throbbing sharp burning sensation, radiating up to her mid chest, and also down to her low abdomen. Pain usually lasting for minutes, comes in waves, and resolved without specific treatment. Pain was intense this morning after she had breakfast. She endorses a 10 out of 10 pain. No relief after taking antacid. Also endorsed heart palpitation, and burping. No complaints of fever, chills, headache, sore throat, nausea, vomiting, diarrhea, shortness of breath, dyspnea on exertion, or leg swelling. Denies exertional chest pain. Denies food worsen her symptoms. Earlier this morning when she experienced the symptoms she did call EMS. Initially her vital sign was stable and her EKG was unremarkable. Patient declined and transported. Now that her symptoms returned, she is here for further evaluation. Patient had cardiac cath for similar pain last year, but no evidence of significant coronary disease or ischemic changes.   Past Medical History  Diagnosis Date  . High cholesterol   . Phlebitis   . Asthma   . Chronic bronchitis     "have it whenever I have a cold"  . Shortness of breath on exertion   . Diabetes mellitus type 2 in obese   . Herpes simplex     "type 1 & 2"  . GERD (gastroesophageal reflux disease)   . Headache(784.0)   . Angina   . Dysrhythmia 08/03/11    "palpitations"  . Hypertension    Past Surgical History  Procedure Laterality Date  . Salpingectomy      w/ovary removal; "right"  .  Salpingoophorectomy     History reviewed. No pertinent family history. History  Substance Use Topics  . Smoking status: Never Smoker   . Smokeless tobacco: Never Used  . Alcohol Use: No   OB History   Grav Para Term Preterm Abortions TAB SAB Ect Mult Living                 Review of Systems  All other systems reviewed and are negative.    Allergies  Hydrocodone and Morphine and related  Home Medications   Current Outpatient Rx  Name  Route  Sig  Dispense  Refill  . ALBUTEROL SULFATE HFA IN   Inhalation   Inhale 2 puffs into the lungs daily as needed (shortness of breath).         Marland Kitchen aspirin EC 81 MG tablet   Oral   Take 81 mg by mouth daily.         Marland Kitchen atenolol (TENORMIN) 50 MG tablet   Oral   Take 50 mg by mouth daily.         Marland Kitchen atorvastatin (LIPITOR) 80 MG tablet   Oral   Take 80 mg by mouth at bedtime.         Marland Kitchen ibuprofen (ADVIL,MOTRIN) 200 MG tablet   Oral   Take 400 mg by mouth every 6 (six) hours as needed for pain.         Marland Kitchen insulin glargine (LANTUS) 100 UNIT/ML injection   Subcutaneous   Inject 80 Units into the skin daily.         Marland Kitchen  insulin NPH-regular (NOVOLIN 70/30) (70-30) 100 UNIT/ML injection   Subcutaneous   Inject 100 Units into the skin 2 (two) times daily with a meal.         . losartan (COZAAR) 25 MG tablet   Oral   Take 25 mg by mouth daily.         . Multiple Vitamins-Minerals (CENTRUM PO)   Oral   Take 1 tablet by mouth daily. 50 PLUS CENTRUM          BP 145/68  Pulse 91  Temp(Src) 98.4 F (36.9 C) (Oral)  Resp 18  SpO2 94%  LMP 04/22/2011 Physical Exam  Nursing note and vitals reviewed. Constitutional: She is oriented to person, place, and time. She appears well-developed and well-nourished. No distress.  Morbid obese  HENT:  Head: Atraumatic.  Neck: Neck supple.  Cardiovascular: Normal rate and regular rhythm.   Pulmonary/Chest: Effort normal and breath sounds normal. She exhibits tenderness  (tenderness to mid chest wall on palpation without crepitus, emphysema, or deformity noted.).  Abdominal: Soft. There is tenderness (Diffuse abdominal tenderness most significant at epigastric region. No guarding, no rebound tenderness. Normal bowel sounds.).  Musculoskeletal: She exhibits no edema.  Neurological: She is alert and oriented to person, place, and time.  Skin: Skin is warm. No rash noted.  Psychiatric: She has a normal mood and affect.    ED Course   Procedures (including critical care time)  4:02 PM Patient presented with atypical chest pain. Symptoms may suggest worsening of her GERD. However her abdomen is diffusely tender on exam. She does have an elevated white count of 14. Will consider CT scan versus ultrasound for further evaluation. Cannot rule out biliary disease.  6:58 PM Patient report improvement with symptoms after receiving medication here. Abdominal ultrasound shows no acute pathology. The symptom is likely GERD related. I offer patient the options of having an abdominal CT scan for further evaluation. Patient opts to followup with her primary care Dr. for further evaluation. Will discharge with PPI, and H2 blocker. Strict return precautions discussed. Patient voiced understanding and agrees with plan. All questions answered to patient's satisfaction.  Labs Reviewed  CBC WITH DIFFERENTIAL - Abnormal; Notable for the following:    WBC 14.0 (*)    Neutro Abs 10.5 (*)    All other components within normal limits  BASIC METABOLIC PANEL - Abnormal; Notable for the following:    Glucose, Bld 165 (*)    All other components within normal limits  HEPATIC FUNCTION PANEL - Abnormal; Notable for the following:    Alkaline Phosphatase 162 (*)    All other components within normal limits  LIPASE, BLOOD  URINALYSIS, ROUTINE W REFLEX MICROSCOPIC  POCT I-STAT TROPONIN I   Dg Chest 2 View  12/09/2012   *RADIOLOGY REPORT*  Clinical Data: Epigastric pain for 3 days  CHEST  - 2 VIEW  Comparison: 08/04/2011  Findings: The heart size and mediastinal contours are within normal limits.  Both lungs are clear.  The visualized skeletal structures are unremarkable.  IMPRESSION: No acute cardiopulmonary abnormalities.   Original Report Authenticated By: Signa Kell, M.D.   US Abdomen Complete  12/09/2012   *RADIOLOGY REPORT*  Abdominal ultrasound  History:  Upper abdominal pain  Findings:  The gallbladder is visualized in multiple projections. There are no gallstones, gallbladder wall thickening, or pericholecystic fluid collection.  There is no intrahepatic, common hepatic, or common bile duct dilatation.  Pancreas appears normal.  No focal liver lesions are identified.  There is fatty change in the liver.  Spleen is normal in size and homogeneous in echotexture. Kidneys bilaterally appear normal.  There is no ascites.  Aorta is nonaneurysmal.  Inferior vena cava appears normal.  Impression:  Fatty liver.  While no focal liver lesions are identified, it must be cautioned that the sensitivity of ultrasound for focal mass lesions is diminished given this degree of underlying fatty change.  Study otherwise unremarkable.   Original Report Authenticated By: Bretta Bang, M.D.   1. Abdominal pain, epigastric     MDM  BP 135/53  Pulse 76  Temp(Src) 98.4 F (36.9 C) (Oral)  Resp 21  SpO2 99%  LMP 04/22/2011  I have reviewed nursing notes and vital signs. I personally reviewed the imaging tests through PACS system  I reviewed available ER/hospitalization records thought the EMR   Fayrene Helper, [place] 12/09/12 1903

## 2012-12-09 NOTE — ED Notes (Signed)
Pt presents with episode of diaphoresis and anxiety this morning.  Pt reports she had eaten breakfast, began to feel like her "sugar was dropping" and took 2 glucose tablets.  Pt reports onset of epigastric pain with nausea; EMS was called to scene with VSS and EKG normal - pt declined transport due to asymptomatic at that time.  Pt reports epigastric pain began again, this time, worse with pain radiating up into her chest.  -nausea or shortness of breath.

## 2012-12-09 NOTE — ED Provider Notes (Signed)
Medical screening examination/treatment/procedure(s) were performed by non-physician practitioner and as supervising physician I was immediately available for consultation/collaboration.   Lyanne Co, MD 12/09/12 2312

## 2013-02-18 ENCOUNTER — Emergency Department (HOSPITAL_COMMUNITY)
Admission: EM | Admit: 2013-02-18 | Discharge: 2013-02-18 | Disposition: A | Discharge status: Home / Self Care | Payer: BC Managed Care – PPO | Attending: Emergency Medicine | Admitting: Emergency Medicine

## 2013-02-18 ENCOUNTER — Encounter (HOSPITAL_COMMUNITY): Payer: Self-pay

## 2013-02-18 DIAGNOSIS — Z79899 Other long term (current) drug therapy: Secondary | ICD-10-CM | POA: Insufficient documentation

## 2013-02-18 DIAGNOSIS — Z8672 Personal history of thrombophlebitis: Secondary | ICD-10-CM | POA: Insufficient documentation

## 2013-02-18 DIAGNOSIS — J45909 Unspecified asthma, uncomplicated: Secondary | ICD-10-CM | POA: Insufficient documentation

## 2013-02-18 DIAGNOSIS — R079 Chest pain, unspecified: Secondary | ICD-10-CM | POA: Insufficient documentation

## 2013-02-18 DIAGNOSIS — M25519 Pain in unspecified shoulder: Secondary | ICD-10-CM | POA: Insufficient documentation

## 2013-02-18 DIAGNOSIS — E119 Type 2 diabetes mellitus without complications: Secondary | ICD-10-CM | POA: Insufficient documentation

## 2013-02-18 DIAGNOSIS — E7800 Pure hypercholesterolemia, unspecified: Secondary | ICD-10-CM | POA: Insufficient documentation

## 2013-02-18 DIAGNOSIS — Z8619 Personal history of other infectious and parasitic diseases: Secondary | ICD-10-CM | POA: Insufficient documentation

## 2013-02-18 DIAGNOSIS — R55 Syncope and collapse: Secondary | ICD-10-CM

## 2013-02-18 DIAGNOSIS — Z7982 Long term (current) use of aspirin: Secondary | ICD-10-CM | POA: Insufficient documentation

## 2013-02-18 DIAGNOSIS — I10 Essential (primary) hypertension: Secondary | ICD-10-CM | POA: Insufficient documentation

## 2013-02-18 DIAGNOSIS — R51 Headache: Secondary | ICD-10-CM | POA: Insufficient documentation

## 2013-02-18 DIAGNOSIS — R1013 Epigastric pain: Secondary | ICD-10-CM | POA: Insufficient documentation

## 2013-02-18 DIAGNOSIS — K219 Gastro-esophageal reflux disease without esophagitis: Secondary | ICD-10-CM | POA: Insufficient documentation

## 2013-02-18 LAB — POCT I-STAT, CHEM 8
BUN: 18 mg/dL (ref 6–23)
Calcium, Ion: 1.21 mmol/L (ref 1.12–1.23)
Chloride: 102 mEq/L (ref 96–112)
Creatinine, Ser: 0.9 mg/dL (ref 0.50–1.10)
Glucose, Bld: 103 mg/dL — ABNORMAL HIGH (ref 70–99)
HCT: 42 % (ref 36.0–46.0)
Hemoglobin: 14.3 g/dL (ref 12.0–15.0)
Potassium: 4.4 mEq/L (ref 3.5–5.1)
Sodium: 139 mEq/L (ref 135–145)
TCO2: 28 mmol/L (ref 0–100)

## 2013-02-18 NOTE — ED Provider Notes (Signed)
CSN: [id number]     Arrival date & time 02/18/13  1529 History   First MD Initiated Contact with Patient 02/18/13 1559     Chief Complaint  Patient presents with  . Near Syncope   (Consider location/radiation/quality/duration/timing/severity/associated sxs/prior Treatment) HPI Comments: MULKI ROESLER is a 52 y.o. Female who presents for evaluation of headache, chest pain, and right shoulder pain. The symptoms started today, while she is at work. She had to stop working and began crying secondary to pain. She took some aspirin and ibuprofen and feels better now. The pain in the head, chest, and right shoulder have all improved. They persist at a mild level, at this time. She denies fever, nausea, vomiting, abdominal pain, dysuria, hematuria, constipation,  weakness, or dizziness. She has had some periods of feeling warm and sweaty in the last several days. She denies cough. She ate both breakfast and lunch today, without problems. She ran out of her Prilosec 3 days ago. She is additionally using antacids for epigastric pain that she describes as "heartburn". There are no other known modifying factors.  The history is provided by the patient.    Past Medical History  Diagnosis Date  . High cholesterol   . Phlebitis   . Asthma   . Chronic bronchitis     "have it whenever I have a cold"  . Shortness of breath on exertion   . Diabetes mellitus type 2 in obese   . Herpes simplex     "type 1 & 2"  . GERD (gastroesophageal reflux disease)   . Headache(784.0)   . Angina   . Dysrhythmia 08/03/11    "palpitations"  . Hypertension    Past Surgical History  Procedure Laterality Date  . Salpingectomy      w/ovary removal; "right"  . Salpingoophorectomy     No family history on file. History  Substance Use Topics  . Smoking status: Never Smoker   . Smokeless tobacco: Never Used  . Alcohol Use: No   OB History   Grav Para Term Preterm Abortions TAB SAB Ect Mult Living                  Review of Systems  All other systems reviewed and are negative.    Allergies  Hydrocodone and Morphine and related  Home Medications   Current Outpatient Rx  Name  Route  Sig  Dispense  Refill  . albuterol (PROVENTIL HFA;VENTOLIN HFA) 108 (90 BASE) MCG/ACT inhaler   Inhalation   Inhale 2 puffs into the lungs every 6 (six) hours as needed for wheezing.         Marland Kitchen aspirin 325 MG tablet   Oral   Take 325 mg by mouth daily.         Marland Kitchen atenolol (TENORMIN) 50 MG tablet   Oral   Take 50 mg by mouth daily.         Marland Kitchen atorvastatin (LIPITOR) 80 MG tablet   Oral   Take 80 mg by mouth at bedtime.         . famotidine (PEPCID) 20 MG tablet   Oral   Take 20 mg by mouth daily.         Marland Kitchen ibuprofen (ADVIL,MOTRIN) 200 MG tablet   Oral   Take 400 mg by mouth every 6 (six) hours as needed for pain.         Marland Kitchen insulin NPH-regular (NOVOLIN 70/30) (70-30) 100 UNIT/ML injection   Subcutaneous   Inject  60-100 Units into the skin 2 (two) times daily with a meal.          . losartan (COZAAR) 25 MG tablet   Oral   Take 25 mg by mouth daily.         . Multiple Vitamins-Minerals (CENTRUM PO)   Oral   Take 1 tablet by mouth daily. 50 PLUS CENTRUM         . OVER THE COUNTER MEDICATION   Nasal   Place 1 spray into the nose daily as needed (congestion).          BP 136/77  Pulse 78  Temp(Src) 98.3 F (36.8 C) (Oral)  Resp 20  SpO2 95%  LMP 04/22/2011 Physical Exam  Nursing note and vitals reviewed. Constitutional: She is oriented to person, place, and time. She appears well-developed.  Morbidly obese  HENT:  Head: Normocephalic and atraumatic.  Eyes: Conjunctivae and EOM are normal. Pupils are equal, round, and reactive to light.  Neck: Normal range of motion and phonation normal. Neck supple.  Cardiovascular: Normal rate, regular rhythm and intact distal pulses.   Pulmonary/Chest: Effort normal and breath sounds normal. She exhibits tenderness (mild, diffuse).   Abdominal: Soft. She exhibits no distension. There is tenderness (Epigastric, mild ). There is no guarding.  Musculoskeletal: Normal range of motion.  Neurological: She is alert and oriented to person, place, and time. She exhibits normal muscle tone.  Skin: Skin is warm and dry.  Psychiatric: She has a normal mood and affect. Her behavior is normal. Judgment and thought content normal.    ED Course  Procedures (including critical care time)  Patient Vitals for the past 24 hrs:  BP Temp Temp src Pulse Resp SpO2  02/18/13 1904 136/77 mmHg - - 78 20 95 %  02/18/13 1737 148/75 mmHg - - 83 18 98 %  02/18/13 1736 130/76 mmHg - - 86 20 98 %  02/18/13 1735 139/67 mmHg - - 83 18 97 %  02/18/13 1730 141/71 mmHg - - 79 26 94 %  02/18/13 1721 134/78 mmHg - - 81 18 96 %  02/18/13 1715 134/78 mmHg - - 73 20 96 %  02/18/13 1700 123/69 mmHg - - 76 27 95 %  02/18/13 1645 122/58 mmHg - - 79 21 96 %  02/18/13 1630 113/58 mmHg - - 77 23 96 %  02/18/13 1615 124/104 mmHg - - 84 20 97 %  02/18/13 1600 128/64 mmHg - - 79 26 94 %  02/18/13 1546 121/60 mmHg - - 84 19 95 %  02/18/13 1541 113/58 mmHg 98.3 F (36.8 C) Oral 78 22 95 %   7:07 PM Reevaluation with update and discussion. After initial assessment and treatment, an updated evaluation reveals she feels better. Has ambulated without problem. Adalida Garver L    Date: 02/18/13  Rate: 79  Rhythm: normal sinus rhythm  QRS Axis: normal  PR and QT Intervals: normal  ST/T Wave abnormalities: normal  PR and QRS Conduction Disutrbances:none  Narrative Interpretation:   Old EKG Reviewed: unchanged- 12/09/12   Labs Review Labs Reviewed  POCT I-STAT, CHEM 8 - Abnormal; Notable for the following:    Glucose, Bld 103 (*)    All other components within normal limits    MDM   1. Near syncope       Nonspecific headache, and chest pain, improve spontaneously. Doubt ACS, SAH, meningitis, pneumonia, metabolic instability, or occult infection. She is  stable for discharge with outpatient treatment.  Nursing  Notes Reviewed/ Care Coordinated, and agree without changes. Applicable Imaging Reviewed.  Interpretation of Laboratory Data incorporated into ED treatment   Plan: Home Medications- usual; Home Treatments and Observation- Rest, fluids; return here if the recommended treatment, does not improve the symptoms; Recommended follow up- PCP 4 days.     Flint Melter, MD 02/18/13 [phone]

## 2013-02-18 NOTE — ED Notes (Signed)
Pt arrives via EMS from work with near syncope, dizziness and headache with indigestion.  Pt states the symptoms actually started around 0300

## 2013-02-18 NOTE — ED Notes (Signed)
Walked patient to the bathroom pt. Did well

## 2013-02-28 ENCOUNTER — Other Ambulatory Visit: Payer: Self-pay | Admitting: Family Medicine

## 2013-02-28 DIAGNOSIS — R109 Unspecified abdominal pain: Secondary | ICD-10-CM

## 2013-03-04 ENCOUNTER — Ambulatory Visit
Admission: RE | Admit: 2013-03-04 | Discharge: 2013-03-04 | Disposition: A | Discharge status: Home / Self Care | Payer: BC Managed Care – PPO | Source: Ambulatory Visit | Attending: Family Medicine | Admitting: Family Medicine

## 2013-03-04 DIAGNOSIS — R109 Unspecified abdominal pain: Secondary | ICD-10-CM

## 2013-07-15 ENCOUNTER — Encounter (HOSPITAL_BASED_OUTPATIENT_CLINIC_OR_DEPARTMENT_OTHER): Payer: Self-pay | Admitting: Emergency Medicine

## 2013-07-15 ENCOUNTER — Emergency Department (HOSPITAL_BASED_OUTPATIENT_CLINIC_OR_DEPARTMENT_OTHER)
Admission: EM | Admit: 2013-07-15 | Discharge: 2013-07-15 | Disposition: A | Discharge status: Home / Self Care | Payer: BC Managed Care – PPO | Attending: Emergency Medicine | Admitting: Emergency Medicine

## 2013-07-15 DIAGNOSIS — Z794 Long term (current) use of insulin: Secondary | ICD-10-CM | POA: Insufficient documentation

## 2013-07-15 DIAGNOSIS — Z8672 Personal history of thrombophlebitis: Secondary | ICD-10-CM | POA: Insufficient documentation

## 2013-07-15 DIAGNOSIS — E119 Type 2 diabetes mellitus without complications: Secondary | ICD-10-CM | POA: Insufficient documentation

## 2013-07-15 DIAGNOSIS — Z8619 Personal history of other infectious and parasitic diseases: Secondary | ICD-10-CM | POA: Insufficient documentation

## 2013-07-15 DIAGNOSIS — F411 Generalized anxiety disorder: Secondary | ICD-10-CM | POA: Insufficient documentation

## 2013-07-15 DIAGNOSIS — Z7982 Long term (current) use of aspirin: Secondary | ICD-10-CM | POA: Insufficient documentation

## 2013-07-15 DIAGNOSIS — E669 Obesity, unspecified: Secondary | ICD-10-CM | POA: Insufficient documentation

## 2013-07-15 DIAGNOSIS — E7800 Pure hypercholesterolemia, unspecified: Secondary | ICD-10-CM | POA: Insufficient documentation

## 2013-07-15 DIAGNOSIS — T50995A Adverse effect of other drugs, medicaments and biological substances, initial encounter: Secondary | ICD-10-CM | POA: Insufficient documentation

## 2013-07-15 DIAGNOSIS — J45909 Unspecified asthma, uncomplicated: Secondary | ICD-10-CM | POA: Insufficient documentation

## 2013-07-15 DIAGNOSIS — I10 Essential (primary) hypertension: Secondary | ICD-10-CM | POA: Insufficient documentation

## 2013-07-15 DIAGNOSIS — I209 Angina pectoris, unspecified: Secondary | ICD-10-CM | POA: Insufficient documentation

## 2013-07-15 DIAGNOSIS — T50905A Adverse effect of unspecified drugs, medicaments and biological substances, initial encounter: Secondary | ICD-10-CM

## 2013-07-15 DIAGNOSIS — K219 Gastro-esophageal reflux disease without esophagitis: Secondary | ICD-10-CM | POA: Insufficient documentation

## 2013-07-15 DIAGNOSIS — Z79899 Other long term (current) drug therapy: Secondary | ICD-10-CM | POA: Insufficient documentation

## 2013-07-15 MED ORDER — DIPHENHYDRAMINE HCL 25 MG PO CAPS
25.0000 mg | ORAL_CAPSULE | Freq: Once | ORAL | Status: AC
  Administered 2013-07-15: 25 mg via ORAL
  Filled 2013-07-15: qty 1

## 2013-07-15 NOTE — ED Notes (Addendum)
States she took Cinnamon 500mg  3 hours ago. 45 mins later she got a headache felt like her throat is closing. She is speaking in complete sentences. No respiratory distress or sob.

## 2013-07-15 NOTE — ED Provider Notes (Signed)
CSN: [id number]     Arrival date & time 07/15/13  2232 History  This chart was scribed for Aiden Rao Smitty CordsK Terrell Ostrand-Rasch, MD by Joaquin MusicKristina Sanchez-Matthews, ED Scribe. This patient was seen in room MH03/MH03 and the patient's care was started at 11:07 PM.   Chief Complaint  Patient presents with  . Allergic Reaction   Patient is a 53 y.o. female presenting with allergic reaction. The history is provided by the patient. No language interpreter was used.  Allergic Reaction Presenting symptoms: no difficulty breathing, no difficulty swallowing, no itching, no rash, no swelling and no wheezing   Severity:  Mild Prior allergic episodes:  No prior episodes Context: no poison ivy   Context comment:  OTC Cinnamon pills Relieved by:  Nothing Worsened by:  Nothing tried Ineffective treatments:  None tried  HPI Comments: Elizabeth Huber is a 53 y.o. female who presents to the Emergency Department complaining of allergic reaction that began 3-5 hours ago. Pt states she took 2 Cinnamon pills and states she feels "hyper". Pt denies taking any OTC Benadryl. Pt denies rash.  Past Medical History  Diagnosis Date  . High cholesterol   . Phlebitis   . Asthma   . Chronic bronchitis     "have it whenever I have a cold"  . Shortness of breath on exertion   . Diabetes mellitus type 2 in obese   . Herpes simplex     "type 1 & 2"  . GERD (gastroesophageal reflux disease)   . Headache(784.0)   . Angina   . Dysrhythmia 08/03/11    "palpitations"  . Hypertension    Past Surgical History  Procedure Laterality Date  . Salpingectomy      w/ovary removal; "right"  . Salpingoophorectomy     No family history on file. History  Substance Use Topics  . Smoking status: Never Smoker   . Smokeless tobacco: Never Used  . Alcohol Use: No   OB History   Grav Para Term Preterm Abortions TAB SAB Ect Mult Living                 Review of Systems  HENT: Negative for trouble swallowing.   Respiratory: Negative for  wheezing.   Skin: Negative for itching and rash.  All other systems reviewed and are negative.    Allergies  Hydrocodone and Morphine and related  Home Medications   Current Outpatient Rx  Name  Route  Sig  Dispense  Refill  . albuterol (PROVENTIL HFA;VENTOLIN HFA) 108 (90 BASE) MCG/ACT inhaler   Inhalation   Inhale 2 puffs into the lungs every 6 (six) hours as needed for wheezing.         Marland Kitchen. aspirin 325 MG tablet   Oral   Take 325 mg by mouth daily.         Marland Kitchen. atenolol (TENORMIN) 50 MG tablet   Oral   Take 50 mg by mouth daily.         Marland Kitchen. atorvastatin (LIPITOR) 80 MG tablet   Oral   Take 80 mg by mouth at bedtime.         . famotidine (PEPCID) 20 MG tablet   Oral   Take 20 mg by mouth daily.         Marland Kitchen. ibuprofen (ADVIL,MOTRIN) 200 MG tablet   Oral   Take 400 mg by mouth every 6 (six) hours as needed for pain.         Marland Kitchen. insulin NPH-regular (NOVOLIN 70/30) (70-30)  100 UNIT/ML injection   Subcutaneous   Inject 60-100 Units into the skin 2 (two) times daily with a meal.          . losartan (COZAAR) 25 MG tablet   Oral   Take 25 mg by mouth daily.         . Multiple Vitamins-Minerals (CENTRUM PO)   Oral   Take 1 tablet by mouth daily. 50 PLUS CENTRUM         . OVER THE COUNTER MEDICATION   Nasal   Place 1 spray into the nose daily as needed (congestion).          BP 145/76  Pulse 89  Temp(Src) 98.7 F (37.1 C) (Oral)  Resp 20  Ht 5\' 4"  (1.626 m)  Wt 352 lb (159.666 kg)  BMI 60.39 kg/m2  SpO2 98%  LMP 04/22/2011  Physical Exam  Constitutional: She is oriented to person, place, and time. She appears well-developed and well-nourished. No distress.  HENT:  Head: Normocephalic and atraumatic.  Mouth/Throat: Oropharynx is clear and moist.  No swelling of the lips, tongue, or uvula.  Eyes: Pupils are equal, round, and reactive to light.  Neck: Normal range of motion. Neck supple.  Cardiovascular: Normal rate, regular rhythm and normal  heart sounds.  Exam reveals no gallop and no friction rub.   No murmur heard. Pulmonary/Chest: Effort normal and breath sounds normal. No stridor. No respiratory distress. She has no wheezes. She has no rales.  Abdominal: Soft. Bowel sounds are normal. There is no tenderness.  Musculoskeletal: Normal range of motion.  Neurological: She is alert and oriented to person, place, and time.  Skin: No rash noted. She is not diaphoretic.  Psychiatric: Her mood appears anxious.    ED Course  Procedures  DIAGNOSTIC STUDIES: Oxygen Saturation is 98% on RA, normal by my interpretation.    COORDINATION OF CARE: 11:09 PM-Discussed treatment plan which includes administer Benadryl while in ED. Advised pt to drink plenty of fluids. Pt agreed to plan.   Labs Review Labs Reviewed - No data to display Imaging Review No results found.   EKG Interpretation None     MDM   Final diagnoses:  None    Medication reaction, suspect more anxiety.  No rash no wheezing no tachycardiac no swelling.  Do not take supplements   I personally performed the services described in this documentation, which was scribed in my presence. The recorded information has been reviewed and is accurate.     Jasmine Awe, MD 07/16/13 [phone]

## 2013-07-16 ENCOUNTER — Encounter (HOSPITAL_BASED_OUTPATIENT_CLINIC_OR_DEPARTMENT_OTHER): Payer: Self-pay | Admitting: Emergency Medicine

## 2014-04-27 ENCOUNTER — Encounter (HOSPITAL_COMMUNITY): Payer: Self-pay | Admitting: Cardiovascular Disease

## 2014-09-10 ENCOUNTER — Emergency Department (HOSPITAL_COMMUNITY)
Admission: EM | Admit: 2014-09-10 | Discharge: 2014-09-10 | Disposition: A | Discharge status: Home / Self Care | Payer: BLUE CROSS/BLUE SHIELD | Attending: Emergency Medicine | Admitting: Emergency Medicine

## 2014-09-10 ENCOUNTER — Encounter (HOSPITAL_COMMUNITY): Payer: Self-pay | Admitting: Emergency Medicine

## 2014-09-10 DIAGNOSIS — Z8619 Personal history of other infectious and parasitic diseases: Secondary | ICD-10-CM | POA: Insufficient documentation

## 2014-09-10 DIAGNOSIS — K219 Gastro-esophageal reflux disease without esophagitis: Secondary | ICD-10-CM | POA: Insufficient documentation

## 2014-09-10 DIAGNOSIS — E119 Type 2 diabetes mellitus without complications: Secondary | ICD-10-CM | POA: Diagnosis not present

## 2014-09-10 DIAGNOSIS — Z79899 Other long term (current) drug therapy: Secondary | ICD-10-CM | POA: Insufficient documentation

## 2014-09-10 DIAGNOSIS — Z794 Long term (current) use of insulin: Secondary | ICD-10-CM | POA: Diagnosis not present

## 2014-09-10 DIAGNOSIS — Z7982 Long term (current) use of aspirin: Secondary | ICD-10-CM | POA: Diagnosis not present

## 2014-09-10 DIAGNOSIS — J45909 Unspecified asthma, uncomplicated: Secondary | ICD-10-CM | POA: Insufficient documentation

## 2014-09-10 DIAGNOSIS — E780 Pure hypercholesterolemia: Secondary | ICD-10-CM | POA: Insufficient documentation

## 2014-09-10 DIAGNOSIS — M545 Low back pain: Secondary | ICD-10-CM | POA: Diagnosis not present

## 2014-09-10 DIAGNOSIS — M5489 Other dorsalgia: Secondary | ICD-10-CM

## 2014-09-10 DIAGNOSIS — I10 Essential (primary) hypertension: Secondary | ICD-10-CM | POA: Insufficient documentation

## 2014-09-10 DIAGNOSIS — M549 Dorsalgia, unspecified: Secondary | ICD-10-CM | POA: Diagnosis present

## 2014-09-10 LAB — I-STAT CHEM 8, ED
BUN: 15 mg/dL (ref 6–23)
Calcium, Ion: 1.13 mmol/L (ref 1.12–1.23)
Chloride: 100 mmol/L (ref 96–112)
Creatinine, Ser: 0.8 mg/dL (ref 0.50–1.10)
Glucose, Bld: 145 mg/dL — ABNORMAL HIGH (ref 70–99)
HCT: 41 % (ref 36.0–46.0)
Hemoglobin: 13.9 g/dL (ref 12.0–15.0)
Potassium: 4.3 mmol/L (ref 3.5–5.1)
Sodium: 139 mmol/L (ref 135–145)
TCO2: 28 mmol/L (ref 0–100)

## 2014-09-10 LAB — URINALYSIS, ROUTINE W REFLEX MICROSCOPIC
Bilirubin Urine: NEGATIVE
Glucose, UA: NEGATIVE mg/dL
Hgb urine dipstick: NEGATIVE
Ketones, ur: NEGATIVE mg/dL
Leukocytes, UA: NEGATIVE
Nitrite: NEGATIVE
Protein, ur: NEGATIVE mg/dL
Specific Gravity, Urine: 1.011 (ref 1.005–1.030)
Urobilinogen, UA: 0.2 mg/dL (ref 0.0–1.0)
pH: 5 (ref 5.0–8.0)

## 2014-09-10 MED ORDER — DIAZEPAM 5 MG PO TABS: 5.0000 mg | ORAL_TABLET | ORAL | Status: DC | PRN

## 2014-09-10 MED ORDER — KETOROLAC TROMETHAMINE 60 MG/2ML IM SOLN: 60.0000 mg | Freq: Once | INTRAMUSCULAR | Status: DC

## 2014-09-10 NOTE — Discharge Instructions (Signed)
Back Pain, Adult Low back pain is very common. About 1 in 5 people have back pain.The cause of low back pain is rarely dangerous. The pain often gets better over time.About half of people with a sudden onset of back pain feel better in just 2 weeks. About 8 in 10 people feel better by 6 weeks.  CAUSES Some common causes of back pain include:  Strain of the muscles or ligaments supporting the spine.  Wear and tear (degeneration) of the spinal discs.  Arthritis.  Direct injury to the back. DIAGNOSIS Most of the time, the direct cause of low back pain is not known.However, back pain can be treated effectively even when the exact cause of the pain is unknown.Answering your caregiver's questions about your overall health and symptoms is one of the most accurate ways to make sure the cause of your pain is not dangerous. If your caregiver needs more information, he or she may order lab work or imaging tests (X-rays or MRIs).However, even if imaging tests show changes in your back, this usually does not require surgery. HOME CARE INSTRUCTIONS For many people, back pain returns.Since low back pain is rarely dangerous, it is often a condition that people can learn to manageon their own.   Remain active. It is stressful on the back to sit or stand in one place. Do not sit, drive, or stand in one place for more than 30 minutes at a time. Take short walks on level surfaces as soon as pain allows.Try to increase the length of time you walk each day.  Do not stay in bed.Resting more than 1 or 2 days can delay your recovery.  Do not avoid exercise or work.Your body is made to move.It is not dangerous to be active, even though your back may hurt.Your back will likely heal faster if you return to being active before your pain is gone.  Pay attention to your body when you bend and lift. Many people have less discomfortwhen lifting if they bend their knees, keep the load close to their bodies,and  avoid twisting. Often, the most comfortable positions are those that put less stress on your recovering back.  Find a comfortable position to sleep. Use a firm mattress and lie on your side with your knees slightly bent. If you lie on your back, put a pillow under your knees.  Only take over-the-counter or prescription medicines as directed by your caregiver. Over-the-counter medicines to reduce pain and inflammation are often the most helpful.Your caregiver may prescribe muscle relaxant drugs.These medicines help dull your pain so you can more quickly return to your normal activities and healthy exercise.  Put ice on the injured area.  Put ice in a plastic bag.  Place a towel between your skin and the bag.  Leave the ice on for 15-20 minutes, 03-04 times a day for the first 2 to 3 days. After that, ice and heat may be alternated to reduce pain and spasms.  Ask your caregiver about trying back exercises and gentle massage. This may be of some benefit.  Avoid feeling anxious or stressed.Stress increases muscle tension and can worsen back pain.It is important to recognize when you are anxious or stressed and learn ways to manage it.Exercise is a great option. SEEK MEDICAL CARE IF:  You have pain that is not relieved with rest or medicine.  You have pain that does not improve in 1 week.  You have new symptoms.  You are generally not feeling well. SEEK   IMMEDIATE MEDICAL CARE IF:   You have pain that radiates from your back into your legs.  You develop new bowel or bladder control problems.  You have unusual weakness or numbness in your arms or legs.  You develop nausea or vomiting.  You develop abdominal pain.  You feel faint. Document Released: 05/05/2005 Document Revised: 11/04/2011 Document Reviewed: 09/06/2013 [employer] Patient Information 2015 [employer]. This information is not intended to replace advice given to you by your health care provider. Make sure you  discuss any questions you have with your health care provider.  

## 2014-09-10 NOTE — ED Notes (Signed)
Pt from home c/o bilateral lower back and flank pain x3 days. Pt reports that the only time she "peed in the past two days is when I take my furosemide". Pt reports that she has pressure and "ecruciating pain" when she attempts to walk. Pt adds that she has seen little "shavings in the toilet that are white" for the past three days.  Pt is A&O and in NAD

## 2014-09-10 NOTE — ED Provider Notes (Signed)
CSN: [id number]     Arrival date & time 09/10/14  1408 History   First MD Initiated Contact with Patient 09/10/14 1735     Chief Complaint  Patient presents with  . Back Pain     (Consider location/radiation/quality/duration/timing/severity/associated sxs/prior Treatment) HPI Comments: Patient here complaining of 3 days of bilateral lower back pain is worse with standing and characterized as dull with some radiation down her leg. Denies any saddle anesthesias. No dysuria or hematuria. Pain is worse when she walks as well 2. Denies any injury. No prior history of back injury. Symptoms persistent and are better with rest. Patient has used ibuprofen without relief  Patient is a 54 y.o. female presenting with back pain. The history is provided by the patient.  Back Pain   Past Medical History  Diagnosis Date  . High cholesterol   . Phlebitis   . Asthma   . Chronic bronchitis     "have it whenever I have a cold"  . Shortness of breath on exertion   . Diabetes mellitus type 2 in obese   . Herpes simplex     "type 1 & 2"  . GERD (gastroesophageal reflux disease)   . Headache(784.0)   . Angina   . Dysrhythmia 08/03/11    "palpitations"  . Hypertension    Past Surgical History  Procedure Laterality Date  . Salpingectomy      w/ovary removal; "right"  . Salpingoophorectomy    . Coronary angiogram N/A 08/05/2011    Procedure: CORONARY ANGIOGRAM;  Surgeon: Thurmon Fair, MD;  Location: [facility]ATH [facility];  Service: Cardiovascular;  Laterality: N/A;   No family history on file. History  Substance Use Topics  . Smoking status: Never Smoker   . Smokeless tobacco: Never Used  . Alcohol Use: No   OB History    No data available     Review of Systems  Musculoskeletal: Positive for back pain.  All other systems reviewed and are negative.     Allergies  Hydrocodone; Oxycodone; Morphine and related; and Tradjenta  Home Medications   Prior to Admission medications   Medication Sig  Start Date End Date Taking? Authorizing Provider  albuterol (PROVENTIL HFA;VENTOLIN HFA) 108 (90 BASE) MCG/ACT inhaler Inhale 2 puffs into the lungs every 6 (six) hours as needed for wheezing.    Historical Provider, MD  aspirin 325 MG tablet Take 325 mg by mouth daily.    Historical Provider, MD  atenolol (TENORMIN) 50 MG tablet Take 50 mg by mouth daily.    Historical Provider, MD  atorvastatin (LIPITOR) 80 MG tablet Take 80 mg by mouth at bedtime.    Historical Provider, MD  famotidine (PEPCID) 20 MG tablet Take 20 mg by mouth daily.    Historical Provider, MD  ibuprofen (ADVIL,MOTRIN) 200 MG tablet Take 400 mg by mouth every 6 (six) hours as needed for pain.    Historical Provider, MD  insulin NPH-regular (NOVOLIN 70/30) (70-30) 100 UNIT/ML injection Inject 60-100 Units into the skin 2 (two) times daily with a meal.     Historical Provider, MD  losartan (COZAAR) 25 MG tablet Take 25 mg by mouth daily.    Historical Provider, MD  Multiple Vitamins-Minerals (CENTRUM PO) Take 1 tablet by mouth daily. 50 PLUS CENTRUM    Historical Provider, MD  OVER THE COUNTER MEDICATION Place 1 spray into the nose daily as needed (congestion).    Historical Provider, MD   BP 145/75 mmHg  Pulse 79  Temp(Src) 98.5 F (  36.9 C) (Oral)  Resp 16  SpO2 96%  LMP 04/22/2011 Physical Exam  Constitutional: She is oriented to person, place, and time. She appears well-developed and well-nourished.  Non-toxic appearance. No distress.  HENT:  Head: Normocephalic and atraumatic.  Eyes: Conjunctivae, EOM and lids are normal. Pupils are equal, round, and reactive to light.  Neck: Normal range of motion. Neck supple. No tracheal deviation present. No thyroid mass present.  Cardiovascular: Normal rate, regular rhythm and normal heart sounds.  Exam reveals no gallop.   No murmur heard. Pulmonary/Chest: Effort normal and breath sounds normal. No stridor. No respiratory distress. She has no decreased breath sounds. She has no  wheezes. She has no rhonchi. She has no rales.  Abdominal: Soft. Normal appearance and bowel sounds are normal. She exhibits no distension. There is no tenderness. There is no rebound and no CVA tenderness.  Musculoskeletal: Normal range of motion. She exhibits no edema or tenderness.       Back:  Neurological: She is alert and oriented to person, place, and time. She has normal strength. No cranial nerve deficit or sensory deficit. GCS eye subscore is 4. GCS verbal subscore is 5. GCS motor subscore is 6.  Skin: Skin is warm and dry. No abrasion and no rash noted.  Psychiatric: She has a normal mood and affect. Her speech is normal and behavior is normal.  Nursing note and vitals reviewed.   ED Course  Procedures (including critical care time) Labs Review Labs Reviewed  URINE CULTURE  URINALYSIS, ROUTINE W REFLEX MICROSCOPIC  I-STAT CHEM 8, ED    Imaging Review No results found.   EKG Interpretation None      MDM   Final diagnoses:  None    ua neg, pt to be treated for msk back pain    Lorre NickAnthony Suzane Vanderweide, MD 09/10/14 1958

## 2014-09-12 LAB — URINE CULTURE: Colony Count: 15000

## 2015-03-11 ENCOUNTER — Emergency Department (HOSPITAL_COMMUNITY)
Admission: EM | Admit: 2015-03-11 | Discharge: 2015-03-11 | Disposition: A | Discharge status: Home / Self Care | Payer: BLUE CROSS/BLUE SHIELD | Attending: Emergency Medicine | Admitting: Emergency Medicine

## 2015-03-11 ENCOUNTER — Emergency Department (HOSPITAL_COMMUNITY): Payer: BLUE CROSS/BLUE SHIELD

## 2015-03-11 ENCOUNTER — Encounter (HOSPITAL_COMMUNITY): Payer: Self-pay | Admitting: Emergency Medicine

## 2015-03-11 DIAGNOSIS — I10 Essential (primary) hypertension: Secondary | ICD-10-CM | POA: Diagnosis not present

## 2015-03-11 DIAGNOSIS — Z794 Long term (current) use of insulin: Secondary | ICD-10-CM | POA: Insufficient documentation

## 2015-03-11 DIAGNOSIS — E782 Mixed hyperlipidemia: Secondary | ICD-10-CM | POA: Insufficient documentation

## 2015-03-11 DIAGNOSIS — E669 Obesity, unspecified: Secondary | ICD-10-CM | POA: Insufficient documentation

## 2015-03-11 DIAGNOSIS — R079 Chest pain, unspecified: Secondary | ICD-10-CM

## 2015-03-11 DIAGNOSIS — M546 Pain in thoracic spine: Secondary | ICD-10-CM | POA: Insufficient documentation

## 2015-03-11 DIAGNOSIS — K219 Gastro-esophageal reflux disease without esophagitis: Secondary | ICD-10-CM | POA: Insufficient documentation

## 2015-03-11 DIAGNOSIS — Z3202 Encounter for pregnancy test, result negative: Secondary | ICD-10-CM | POA: Insufficient documentation

## 2015-03-11 DIAGNOSIS — I209 Angina pectoris, unspecified: Secondary | ICD-10-CM | POA: Diagnosis not present

## 2015-03-11 DIAGNOSIS — E119 Type 2 diabetes mellitus without complications: Secondary | ICD-10-CM | POA: Diagnosis not present

## 2015-03-11 DIAGNOSIS — Z7982 Long term (current) use of aspirin: Secondary | ICD-10-CM | POA: Insufficient documentation

## 2015-03-11 DIAGNOSIS — J45909 Unspecified asthma, uncomplicated: Secondary | ICD-10-CM | POA: Diagnosis not present

## 2015-03-11 LAB — URINALYSIS, ROUTINE W REFLEX MICROSCOPIC
Bilirubin Urine: NEGATIVE
Glucose, UA: NEGATIVE mg/dL
Hgb urine dipstick: NEGATIVE
Ketones, ur: NEGATIVE mg/dL
Leukocytes, UA: NEGATIVE
Nitrite: NEGATIVE
Protein, ur: NEGATIVE mg/dL
Specific Gravity, Urine: >1.046 — ABNORMAL HIGH (ref 1.005–1.030)
Urobilinogen, UA: 1 mg/dL (ref 0.0–1.0)
pH: 6 (ref 5.0–8.0)

## 2015-03-11 LAB — CBC WITH DIFFERENTIAL/PLATELET
Basophils Absolute: 0 10*3/uL (ref 0.0–0.1)
Basophils Relative: 0 %
Eosinophils Absolute: 0.1 10*3/uL (ref 0.0–0.7)
Eosinophils Relative: 1 %
HCT: 38.7 % (ref 36.0–46.0)
Hemoglobin: 13.1 g/dL (ref 12.0–15.0)
Lymphocytes Relative: 17 %
Lymphs Abs: 1.8 10*3/uL (ref 0.7–4.0)
MCH: 28.6 pg (ref 26.0–34.0)
MCHC: 33.9 g/dL (ref 30.0–36.0)
MCV: 84.5 fL (ref 78.0–100.0)
Monocytes Absolute: 0.6 10*3/uL (ref 0.1–1.0)
Monocytes Relative: 6 %
Neutro Abs: 8.3 10*3/uL — ABNORMAL HIGH (ref 1.7–7.7)
Neutrophils Relative %: 76 %
Platelets: 330 10*3/uL (ref 150–400)
RBC: 4.58 MIL/uL (ref 3.87–5.11)
RDW: 12.9 % (ref 11.5–15.5)
WBC: 10.8 10*3/uL — ABNORMAL HIGH (ref 4.0–10.5)

## 2015-03-11 LAB — COMPREHENSIVE METABOLIC PANEL
ALT: 18 U/L (ref 14–54)
AST: 19 U/L (ref 15–41)
Albumin: 3.1 g/dL — ABNORMAL LOW (ref 3.5–5.0)
Alkaline Phosphatase: 146 U/L — ABNORMAL HIGH (ref 38–126)
Anion gap: 7 (ref 5–15)
BUN: 14 mg/dL (ref 6–20)
CO2: 28 mmol/L (ref 22–32)
Calcium: 9.3 mg/dL (ref 8.9–10.3)
Chloride: 101 mmol/L (ref 101–111)
Creatinine, Ser: 0.71 mg/dL (ref 0.44–1.00)
GFR calc Af Amer: >60 mL/min (ref >60)
GFR calc non Af Amer: >60 mL/min (ref >60)
Glucose, Bld: 167 mg/dL — ABNORMAL HIGH (ref 65–99)
Potassium: 4.3 mmol/L (ref 3.5–5.1)
Sodium: 136 mmol/L (ref 135–145)
Total Bilirubin: 0.6 mg/dL (ref 0.3–1.2)
Total Protein: 6.8 g/dL (ref 6.5–8.1)

## 2015-03-11 LAB — CBG MONITORING, ED: Glucose-Capillary: 141 mg/dL — ABNORMAL HIGH (ref 65–99)

## 2015-03-11 LAB — I-STAT BETA HCG BLOOD, ED (MC, WL, AP ONLY): I-stat hCG, quantitative: <5 m[IU]/mL (ref <5)

## 2015-03-11 LAB — I-STAT TROPONIN, ED
Troponin i, poc: 0 ng/mL (ref 0.00–0.08)
Troponin i, poc: 0 ng/mL (ref 0.00–0.08)

## 2015-03-11 LAB — I-STAT CREATININE, ED: Creatinine, Ser: 0.7 mg/dL (ref 0.44–1.00)

## 2015-03-11 LAB — LIPASE, BLOOD: Lipase: 22 U/L (ref 11–51)

## 2015-03-11 MED ORDER — LABETALOL HCL 5 MG/ML IV SOLN
10.0000 mg | Freq: Once | INTRAVENOUS | Status: AC
  Administered 2015-03-11: 10 mg via INTRAVENOUS
  Filled 2015-03-11: qty 4

## 2015-03-11 MED ORDER — LORAZEPAM 2 MG/ML IJ SOLN
1.0000 mg | Freq: Once | INTRAMUSCULAR | Status: AC
  Administered 2015-03-11: 1 mg via INTRAVENOUS
  Filled 2015-03-11: qty 1

## 2015-03-11 MED ORDER — IOHEXOL 350 MG/ML SOLN
100.0000 mL | Freq: Once | INTRAVENOUS | Status: AC | PRN
  Administered 2015-03-11: 100 mL via INTRAVENOUS

## 2015-03-11 NOTE — ED Provider Notes (Signed)
CSN: [id number]     Arrival date & time 03/11/15  3016 History   First MD Initiated Contact with Patient 03/11/15 [phone]     Chief Complaint  Patient presents with  . Chest Pain  . Back Pain     (Consider location/radiation/quality/duration/timing/severity/associated sxs/prior Treatment) Patient is a 54 y.o. female presenting with chest pain and back pain. The history is provided by the patient, a parent and medical records. No language interpreter was used.  Chest Pain Associated symptoms: back pain and shortness of breath   Associated symptoms: no abdominal pain, no cough, no diaphoresis, no fatigue, no fever, no headache, no nausea and not vomiting   Back Pain Associated symptoms: chest pain   Associated symptoms: no abdominal pain, no dysuria, no fever and no headaches      Elizabeth Huber is a 54 y.o. female  with a hx of HTN, high cholesterol, asthma, chronic bronchitis, insulin-dependent diabetes, palpitations presents to the Emergency Department complaining of acute, persistent, progressively worsening upper back pain, rated at a 8/10 that radiates through the chest to the substernal region awaking her from sleep at 3 AM. At that time patient took some cough medicine and return to sleep. She reports the pain again awoke her from sleep at 6 AM. She reports associated shortness of breath and discomfort. She has a history of dyspnea on exertion and obesity however she reports that he does not currently make her chest or back pain worse. She reports many months of decreased urine output and intermittent urinary retention unless she is taking her diuretic.  She reports difficulty urinating this morning.  (Of note, I do not see a diuretic listed on her medication list and she does not the name.)  Patient denies fever, chills, cough, chest congestion, headache, neck pain, abdominal pain, vomiting, diarrhea, weakness, dizziness, syncope, dysuria. Patient reports that her shortness of breath worsens  when her back pain and chest pain worsen.  Patient reports she is postmenopausal. She does not take estrogen supplement. She denies leg swelling, recent surgeries or fractures or long periods of immobilization. She denies history of blood clot.  Pt given ASA and nitro x3 with improvement of chest pain but no change in back pain.     Past Medical History  Diagnosis Date  . High cholesterol   . Phlebitis   . Asthma   . Chronic bronchitis     "have it whenever I have a cold"  . Shortness of breath on exertion   . Diabetes mellitus type 2 in obese (HCC)   . Herpes simplex     "type 1 & 2"  . GERD (gastroesophageal reflux disease)   . Headache(784.0)   . Angina   . Dysrhythmia 08/03/11    "palpitations"  . Hypertension    Past Surgical History  Procedure Laterality Date  . Salpingectomy      w/ovary removal; "right"  . Salpingoophorectomy    . Coronary angiogram N/A 08/05/2011    Procedure: CORONARY ANGIOGRAM;  Surgeon: Thurmon Fair, MD;  Location: [facility] CATH [facility];  Service: Cardiovascular;  Laterality: N/A;   History reviewed. No pertinent family history. Social History  Substance Use Topics  . Smoking status: Never Smoker   . Smokeless tobacco: Never Used  . Alcohol Use: No   OB History    No data available     Review of Systems  Constitutional: Negative for fever, diaphoresis, appetite change, fatigue and unexpected weight change.  HENT: Negative for mouth sores.  Eyes: Negative for visual disturbance.  Respiratory: Positive for chest tightness and shortness of breath. Negative for cough and wheezing.   Cardiovascular: Positive for chest pain.  Gastrointestinal: Negative for nausea, vomiting, abdominal pain, diarrhea and constipation.  Endocrine: Negative for polydipsia, polyphagia and polyuria.  Genitourinary: Positive for urgency. Negative for dysuria, frequency and hematuria.  Musculoskeletal: Positive for back pain. Negative for neck stiffness.  Skin: Negative for  rash.  Allergic/Immunologic: Negative for immunocompromised state.  Neurological: Negative for syncope, light-headedness and headaches.  Hematological: Does not bruise/bleed easily.  Psychiatric/Behavioral: Negative for sleep disturbance. The patient is not nervous/anxious.       Allergies  Hydrocodone; Oxycodone; Morphine and related; and Tradjenta  Home Medications   Prior to Admission medications   Medication Sig Start Date End Date Taking? Authorizing Provider  aspirin 325 MG tablet Take 1,300 mg by mouth daily.    Yes Historical Provider, MD  atenolol (TENORMIN) 50 MG tablet Take 50 mg by mouth daily.   Yes Historical Provider, MD  atorvastatin (LIPITOR) 80 MG tablet Take 80 mg by mouth at bedtime.   Yes Historical Provider, MD  famotidine (PEPCID) 20 MG tablet Take 20 mg by mouth at bedtime.    Yes Historical Provider, MD  fexofenadine (ALLEGRA) 30 MG tablet Take 30 mg by mouth daily as needed (allergies).   Yes Historical Provider, MD  ibuprofen (ADVIL,MOTRIN) 200 MG tablet Take 200 mg by mouth every 6 (six) hours as needed for moderate pain (pain).    Yes Historical Provider, MD  insulin NPH-regular (NOVOLIN 70/30) (70-30) 100 UNIT/ML injection Inject 70-80 Units into the skin 2 (two) times daily with a meal. Take 80 units in the am and Take 70 units at bedtime   Yes Historical Provider, MD  losartan (COZAAR) 25 MG tablet Take 25 mg by mouth daily.   Yes Historical Provider, MD  Multiple Vitamins-Minerals (CENTRUM PO) Take 1 tablet by mouth daily.    Yes Historical Provider, MD  omeprazole (PRILOSEC) 20 MG capsule Take 20 mg by mouth See admin instructions. Take 20 mg by mouth for 14 days then stop for 14 days days.   Yes Historical Provider, MD  OVER THE COUNTER MEDICATION Place 1 spray into the nose daily as needed (congestion). "Vicks Nasal Stick"   Yes Historical Provider, MD  albuterol (PROVENTIL HFA;VENTOLIN HFA) 108 (90 BASE) MCG/ACT inhaler Inhale 2 puffs into the lungs  every 6 (six) hours as needed for wheezing.    Historical Provider, MD  diazepam (VALIUM) 5 MG tablet Take 1 tablet (5 mg total) by mouth every 4 (four) hours as needed for muscle spasms. Patient not taking: Reported on 03/11/2015 09/10/14   Lorre Nick, MD   BP 119/58 mmHg  Pulse 89  Temp(Src) 97.8 F (36.6 C) (Oral)  Resp 11  Ht 5\' 4"  (1.626 m)  Wt 345 lb (156.491 kg)  BMI 59.19 kg/m2  SpO2 97%  LMP 04/22/2011 Physical Exam  Constitutional: She appears well-developed and well-nourished. No distress.  Awake, alert, nontoxic appearance Obese  HENT:  Head: Normocephalic and atraumatic.  Mouth/Throat: Oropharynx is clear and moist. No oropharyngeal exudate.  Eyes: Conjunctivae are normal. No scleral icterus.  Neck: Normal range of motion. Neck supple.  Full ROM without pain  Cardiovascular: Normal rate, regular rhythm, normal heart sounds and intact distal pulses.   No murmur heard. Pulmonary/Chest: Effort normal. No respiratory distress. She has decreased breath sounds ( throughout). She has no wheezes. She has no rhonchi. She has no  rales.  Equal chest expansion  Abdominal: Soft. Bowel sounds are normal. She exhibits no distension and no mass. There is no tenderness. There is no rebound and no guarding.  Musculoskeletal: Normal range of motion. She exhibits no edema.  Full range of motion of the T-spine and L-spine No tenderness to palpation of the spinous processes of the T-spine or L-spine No tenderness to palpation of the paraspinous muscles of the L-spine Negative calf tenderness, no palpable cord No pitting edema Negative Homans sign  Lymphadenopathy:    She has no cervical adenopathy.  Neurological: She is alert. She has normal reflexes.  Reflex Scores:      Bicep reflexes are 2+ on the right side and 2+ on the left side.      Brachioradialis reflexes are 2+ on the right side and 2+ on the left side.      Patellar reflexes are 2+ on the right side and 2+ on the left  side.      Achilles reflexes are 2+ on the right side and 2+ on the left side. Speech is clear and goal oriented, follows commands Normal 5/5 strength in upper and lower extremities bilaterally including dorsiflexion and plantar flexion, strong and equal grip strength Sensation normal to light and sharp touch Moves extremities without ataxia, coordination intact Normal gait Normal balance No Clonus   Skin: Skin is warm and dry. No rash noted. She is not diaphoretic. No erythema.  Psychiatric: She has a normal mood and affect. Her behavior is normal.  Nursing note and vitals reviewed.   ED Course  Procedures (including critical care time) Labs Review Labs Reviewed  CBC WITH DIFFERENTIAL/PLATELET - Abnormal; Notable for the following:    WBC 10.8 (*)    Neutro Abs 8.3 (*)    All other components within normal limits  COMPREHENSIVE METABOLIC PANEL - Abnormal; Notable for the following:    Glucose, Bld 167 (*)    Albumin 3.1 (*)    Alkaline Phosphatase 146 (*)    All other components within normal limits  URINALYSIS, ROUTINE W REFLEX MICROSCOPIC (NOT AT [facility]) - Abnormal; Notable for the following:    Specific Gravity, Urine >1.046 (*)    All other components within normal limits  CBG MONITORING, ED - Abnormal; Notable for the following:    Glucose-Capillary 141 (*)    All other components within normal limits  LIPASE, BLOOD  I-STAT TROPOININ, ED  I-STAT BETA HCG BLOOD, ED ([facility], [facility], [facility] ONLY)  I-STAT CREATININE, ED  I-STAT TROPOININ, ED    Imaging Review Dg Chest 2 View  03/11/2015  CLINICAL DATA:  Chest pain, body aches, shortness of breath EXAM: CHEST  2 VIEW COMPARISON:  CTA chest dated 03/11/2015 FINDINGS: Lungs are clear.  No pleural effusion or pneumothorax. The heart is top-normal in size. Mild degenerative changes of the visualized thoracolumbar spine. IMPRESSION: Normal chest radiographs. Electronically Signed   By: Charline Bills M.D.   On: 03/11/2015 10:55   Ct  Angio Chest Aorta W/cm &/or Wo/cm  03/11/2015  CLINICAL DATA:  Chest pain radiating to back, shortness of breath, palpitations, evaluate for dissection EXAM: CT ANGIOGRAPHY CHEST, ABDOMEN AND PELVIS TECHNIQUE: Multidetector CT imaging through the chest, abdomen and pelvis was performed using the standard protocol during bolus administration of intravenous contrast. Multiplanar reconstructed images and MIPs were obtained and reviewed to evaluate the vascular anatomy. CONTRAST:  OMNIPAQUE IOHEXOL 350 MG/ML SOLN COMPARISON:  Chest radiographs dated 12/09/2012 FINDINGS: CT CHEST FINDINGS On unenhanced  CT, there is no evidence of mediastinal hematoma. No evidence of thoracic aortic aneurysm or dissection. Although not tailored for evaluation of the pulmonary artery is, there is no evidence of pulmonary embolism to the level of the lobar pulmonary arteries. Mediastinum/Nodes: Heart is top-normal in size. No pericardial effusion. Coronary atherosclerosis. Very mild atherosclerotic calcifications of the aortic arch. No suspicious mediastinal, hilar, or axillary lymphadenopathy. Visualized thyroid is unremarkable. Lungs/Pleura: Nodes are clear. No suspicious pulmonary nodules. No focal consolidation. No pleural effusion or pneumothorax. Musculoskeletal: Degenerative changes of the thoracic spine. CT ABDOMEN PELVIS FINDINGS No evidence abdominal aortic aneurysm or dissection. Hepatobiliary: Hepatic steatosis with focal fatty sparing. Gallbladder is unremarkable. No intrahepatic or extrahepatic ductal dilatation. Pancreas: Within normal limits. Spleen: Within normal limits. Adrenals/Urinary Tract: Adrenal glands are within normal limits. Kidneys are within normal limits.  No hydronephrosis. Bladder is within normal limits. Stomach/Bowel: Stomach is within normal limits. No evidence of bowel obstruction. Normal appendix. Vascular/Lymphatic: Retroaortic left renal vein. Additional vascular findings as above. No  suspicious abdominopelvic lymphadenopathy. Reproductive: Uterus is within normal limits. Left ovary is within normal limits.  No right adnexal mass. Other: No abdominopelvic ascites. Musculoskeletal: Mild degenerative changes of the lumbar spine. Sclerosis along the bilateral sacroiliac joints. Review of the MIP images confirms the above findings. IMPRESSION: No evidence of thoracoabdominal aortic aneurysm or dissection. No evidence of pulmonary embolism to the level of the lobar pulmonary arteries. No evidence of acute cardiopulmonary disease. Hepatic steatosis with focal fatty sparing. Electronically Signed   By: Charline BillsSriyesh  Krishnan M.D.   On: 03/11/2015 10:47   Ct Cta Abd/pel W/cm &/or W/o Cm  03/11/2015  CLINICAL DATA:  Chest pain radiating to back, shortness of breath, palpitations, evaluate for dissection EXAM: CT ANGIOGRAPHY CHEST, ABDOMEN AND PELVIS TECHNIQUE: Multidetector CT imaging through the chest, abdomen and pelvis was performed using the standard protocol during bolus administration of intravenous contrast. Multiplanar reconstructed images and MIPs were obtained and reviewed to evaluate the vascular anatomy. CONTRAST:  100mL OMNIPAQUE IOHEXOL 350 MG/ML SOLN COMPARISON:  Chest radiographs dated 12/09/2012 FINDINGS: CT CHEST FINDINGS On unenhanced CT, there is no evidence of mediastinal hematoma. No evidence of thoracic aortic aneurysm or dissection. Although not tailored for evaluation of the pulmonary artery is, there is no evidence of pulmonary embolism to the level of the lobar pulmonary arteries. Mediastinum/Nodes: Heart is top-normal in size. No pericardial effusion. Coronary atherosclerosis. Very mild atherosclerotic calcifications of the aortic arch. No suspicious mediastinal, hilar, or axillary lymphadenopathy. Visualized thyroid is unremarkable. Lungs/Pleura: Nodes are clear. No suspicious pulmonary nodules. No focal consolidation. No pleural effusion or pneumothorax. Musculoskeletal:  Degenerative changes of the thoracic spine. CT ABDOMEN PELVIS FINDINGS No evidence abdominal aortic aneurysm or dissection. Hepatobiliary: Hepatic steatosis with focal fatty sparing. Gallbladder is unremarkable. No intrahepatic or extrahepatic ductal dilatation. Pancreas: Within normal limits. Spleen: Within normal limits. Adrenals/Urinary Tract: Adrenal glands are within normal limits. Kidneys are within normal limits.  No hydronephrosis. Bladder is within normal limits. Stomach/Bowel: Stomach is within normal limits. No evidence of bowel obstruction. Normal appendix. Vascular/Lymphatic: Retroaortic left renal vein. Additional vascular findings as above. No suspicious abdominopelvic lymphadenopathy. Reproductive: Uterus is within normal limits. Left ovary is within normal limits.  No right adnexal mass. Other: No abdominopelvic ascites. Musculoskeletal: Mild degenerative changes of the lumbar spine. Sclerosis along the bilateral sacroiliac joints. Review of the MIP images confirms the above findings. IMPRESSION: No evidence of thoracoabdominal aortic aneurysm or dissection. No evidence of pulmonary embolism to the level of  the lobar pulmonary arteries. No evidence of acute cardiopulmonary disease. Hepatic steatosis with focal fatty sparing. Electronically Signed   By: Charline Bills M.D.   On: 03/11/2015 10:47   I have personally reviewed and evaluated these images and lab results as part of my medical decision-making.   EKG Interpretation   Date/Time:  Sunday March 11 2015 09:01:09 EDT Ventricular Rate:  91 PR Interval:  135 QRS Duration: 93 QT Interval:  380 QTC Calculation: 467 R Axis:   26 Text Interpretation:  Sinus rhythm Low voltage, precordial leads No  significant change since last tracing Confirmed by YAO  MD, DAVID ([id number])  on 03/11/2015 9:03:25 AM      Cardiac 08/05/2011:  Angiographic Findings:  1. The left main coronary artery is free of significant atherosclerosis and  bifurcates in the usual fashion into the left anterior descending artery and left circumflex coronary artery. A tiny ramus intermedius artery is also seen. 2. The left anterior descending artery is a large vessel that reaches the apex and generates 2 major diagonal branches. There is evidence of moderate luminal irregularities and mild calcification. Moderate (approximately 40%) stenoses are seen is beyond the takeoff of the first diagonal artery and second diagonal artery. The appearance of the stenoses does not change significantly after administration of intracoronary nitroglycerin. 3. The left circumflex coronary artery is a small to medium-size vessel dominant vessel that generates 1 major oblque marginal artery. There is evidence of minimal luminal irregularities and no calcification. No hemodynamically meaningful stenoses are seen. 4. The right coronary artery is a very large-size dominant vessel that generates a bifurcating posterior lateral ventricular system as well as a very long posterior descending artery. There is evidence of minimal luminal irregularities and no calcification. No hemodynamically meaningful stenoses are seen.  5. The left ventricle was not injected to reduce risk of contrast nephrotoxicity. There is no aortic valve stenosis by pullback. The left ventricular end-diastolic pressure is 22 mm Hg.    IMPRESSIONS:  Minor coronary atherosclerosis in the mid LAD artery. This does not appear to be severe enough to cause symptoms. In a patient with diabetes it does justify aggressive treatment of coronary risk factors. Target LDL cholesterol should be less than 70 mg/dL.         MRI 10/26/2011 IMPRESSION: L3-4: Minimal facet hypertrophy. No slippage or stenosis.  L4-5: Bilateral facet degeneration with 2 mm of anterolisthesis. Mild desiccation of the disc but no stenosis or herniation. These findings could be associated with mechanical low back pain.  L5-S1: Annular  tearing in the right posterolateral corner without frank herniation of disc material. This could possibly irritate the right L5 nerve root. Some facet degeneration also at this level without slippage.  MDM   Final diagnoses:  Chest pain, unspecified chest pain type  Bilateral thoracic back pain  Obesity  Essential hypertension   Elizabeth Huber presents with significant back pain radiating into her chest in the substernal region. Patient also with shortness of breath. She denies previous cardiac history.  Concern for possible dissection vs ACS.  EKG is unchanged today.  Pt also with c/o intermittent urinary retention for many months.  No dysuria.  Normal neurologic exam and denies low back pain.  Record review shows MRI in 2013 without disc herniation.  No evidence of cauda equina on exam today.    11:12 AM CT angio of chest without evidence of dissection. Negative troponin.  Patient reports her pain is now a 3/10 and much more  like her usual back pain. She is resting comfortably on exam. Mild leukocytosis of 10.8 is nonspecific.  There was no evidence of pneumonia, pneumothorax or pulmonary edema on CT or plain films.  Urinalysis pending.  1:15 PM UA without evidence of urinary tract infection. Bladder scan pre-void at 235 mL's, patient voided 450 mL's and Post void 47 mL's.  No evidence of urinary retention.    2:20 PM Delta troponin negative. The patient has been chest pain-free for several hours now. Back pain is now rated at a 1/10. She reports this is normal for her. There is no evidence of urinary retention. No evidence of ACS. No evidence of thoracic dissection, coronary embolism, pulmonary edema.  Tolerating by mouth without difficulty.  Will discharge home with primary care follow-up within 48 hours and cardiology follow-up within 1 week. Patient is to return to the emergency department for return of symptoms. Vital signs are stable at this time.  BP 119/58 mmHg  Pulse 89   Temp(Src) 97.8 F (36.6 C) (Oral)  Resp 11  Ht  (1.626 m)  Wt 345 lb (156.491 kg)  BMI 59.19 kg/m2  SpO2 97%  LMP 04/22/2011  The patient was discussed with and seen by Dr. Silverio Lay who agrees with the treatment plan.   Dahlia Client Tyreona Panjwani, PA-C 03/11/15 1422  Richardean Canal, MD 03/12/15 1045  Richardean Canal, MD 03/12/15 1106

## 2015-03-11 NOTE — ED Notes (Signed)
Patient transported to CT 

## 2015-03-11 NOTE — Discharge Instructions (Signed)
1. Medications: usual home medications 2. Treatment: rest, drink plenty of fluids,  3. Follow Up: Please followup with your primary doctor in 2 days for discussion of your diagnoses and further evaluation after today's visit; if you do not have a primary care doctor use the resource guide provided to find one; Please return to the ER for return of chest pain, worsening symptoms, difficulty breathing

## 2015-03-11 NOTE — ED Notes (Signed)
Reports waking up at 0600 with SOB and palpitations. Discomfort in back that radiates through to chest and left arm. Given 325mg  ASA and 3 nitro in route. CP resolved from 8 to 0. Back pain remains at an 8.

## 2015-03-11 NOTE — ED Notes (Signed)
Patient returned from CT

## 2015-11-26 ENCOUNTER — Emergency Department (HOSPITAL_COMMUNITY): Payer: BLUE CROSS/BLUE SHIELD

## 2015-11-26 ENCOUNTER — Emergency Department (HOSPITAL_COMMUNITY)
Admission: EM | Admit: 2015-11-26 | Discharge: 2015-11-26 | Disposition: A | Discharge status: Home / Self Care | Payer: BLUE CROSS/BLUE SHIELD | Attending: Emergency Medicine | Admitting: Emergency Medicine

## 2015-11-26 ENCOUNTER — Encounter (HOSPITAL_COMMUNITY): Payer: Self-pay | Admitting: Emergency Medicine

## 2015-11-26 DIAGNOSIS — I10 Essential (primary) hypertension: Secondary | ICD-10-CM | POA: Insufficient documentation

## 2015-11-26 DIAGNOSIS — J45909 Unspecified asthma, uncomplicated: Secondary | ICD-10-CM | POA: Diagnosis not present

## 2015-11-26 DIAGNOSIS — Z7982 Long term (current) use of aspirin: Secondary | ICD-10-CM | POA: Diagnosis not present

## 2015-11-26 DIAGNOSIS — Z794 Long term (current) use of insulin: Secondary | ICD-10-CM | POA: Insufficient documentation

## 2015-11-26 DIAGNOSIS — Z79899 Other long term (current) drug therapy: Secondary | ICD-10-CM | POA: Insufficient documentation

## 2015-11-26 DIAGNOSIS — E119 Type 2 diabetes mellitus without complications: Secondary | ICD-10-CM | POA: Diagnosis not present

## 2015-11-26 DIAGNOSIS — R1012 Left upper quadrant pain: Secondary | ICD-10-CM | POA: Insufficient documentation

## 2015-11-26 DIAGNOSIS — M5136 Other intervertebral disc degeneration, lumbar region: Secondary | ICD-10-CM | POA: Diagnosis not present

## 2015-11-26 DIAGNOSIS — R110 Nausea: Secondary | ICD-10-CM | POA: Insufficient documentation

## 2015-11-26 DIAGNOSIS — R109 Unspecified abdominal pain: Secondary | ICD-10-CM

## 2015-11-26 DIAGNOSIS — Z791 Long term (current) use of non-steroidal anti-inflammatories (NSAID): Secondary | ICD-10-CM | POA: Insufficient documentation

## 2015-11-26 LAB — COMPREHENSIVE METABOLIC PANEL
ALT: 17 U/L (ref 14–54)
AST: 24 U/L (ref 15–41)
Albumin: 4 g/dL (ref 3.5–5.0)
Alkaline Phosphatase: 144 U/L — ABNORMAL HIGH (ref 38–126)
Anion gap: 10 (ref 5–15)
BUN: 22 mg/dL — ABNORMAL HIGH (ref 6–20)
CO2: 28 mmol/L (ref 22–32)
Calcium: 9.1 mg/dL (ref 8.9–10.3)
Chloride: 97 mmol/L — ABNORMAL LOW (ref 101–111)
Creatinine, Ser: 0.89 mg/dL (ref 0.44–1.00)
GFR calc Af Amer: >60 mL/min (ref >60)
GFR calc non Af Amer: >60 mL/min (ref >60)
Glucose, Bld: 134 mg/dL — ABNORMAL HIGH (ref 65–99)
Potassium: 4.4 mmol/L (ref 3.5–5.1)
Sodium: 135 mmol/L (ref 135–145)
Total Bilirubin: 0.8 mg/dL (ref 0.3–1.2)
Total Protein: 8.3 g/dL — ABNORMAL HIGH (ref 6.5–8.1)

## 2015-11-26 LAB — URINALYSIS, ROUTINE W REFLEX MICROSCOPIC
Bilirubin Urine: NEGATIVE
Glucose, UA: NEGATIVE mg/dL
Hgb urine dipstick: NEGATIVE
Ketones, ur: NEGATIVE mg/dL
Leukocytes, UA: NEGATIVE
Nitrite: NEGATIVE
Protein, ur: NEGATIVE mg/dL
Specific Gravity, Urine: 1.019 (ref 1.005–1.030)
pH: 6 (ref 5.0–8.0)

## 2015-11-26 LAB — CBC WITH DIFFERENTIAL/PLATELET
Basophils Absolute: 0 10*3/uL (ref 0.0–0.1)
Basophils Relative: 0 %
Eosinophils Absolute: 0.1 10*3/uL (ref 0.0–0.7)
Eosinophils Relative: 1 %
HCT: 38.8 % (ref 36.0–46.0)
Hemoglobin: 13.7 g/dL (ref 12.0–15.0)
Lymphocytes Relative: 21 %
Lymphs Abs: 2.6 10*3/uL (ref 0.7–4.0)
MCH: 28.7 pg (ref 26.0–34.0)
MCHC: 35.3 g/dL (ref 30.0–36.0)
MCV: 81.3 fL (ref 78.0–100.0)
Monocytes Absolute: 0.8 10*3/uL (ref 0.1–1.0)
Monocytes Relative: 7 %
Neutro Abs: 8.5 10*3/uL — ABNORMAL HIGH (ref 1.7–7.7)
Neutrophils Relative %: 71 %
Platelets: 362 10*3/uL (ref 150–400)
RBC: 4.77 MIL/uL (ref 3.87–5.11)
RDW: 13.1 % (ref 11.5–15.5)
WBC: 12 10*3/uL — ABNORMAL HIGH (ref 4.0–10.5)

## 2015-11-26 LAB — LIPASE, BLOOD: Lipase: 18 U/L (ref 11–51)

## 2015-11-26 LAB — I-STAT CG4 LACTIC ACID, ED: Lactic Acid, Venous: 1.62 mmol/L (ref 0.5–1.9)

## 2015-11-26 MED ORDER — DIATRIZOATE MEGLUMINE & SODIUM 66-10 % PO SOLN
15.0000 mL | Freq: Once | ORAL | Status: AC
  Administered 2015-11-26: 15 mL via ORAL

## 2015-11-26 MED ORDER — KETOROLAC TROMETHAMINE 30 MG/ML IJ SOLN
30.0000 mg | Freq: Once | INTRAMUSCULAR | Status: AC
  Administered 2015-11-26: 30 mg via INTRAVENOUS
  Filled 2015-11-26: qty 1

## 2015-11-26 MED ORDER — IOPAMIDOL (ISOVUE-300) INJECTION 61%
100.0000 mL | Freq: Once | INTRAVENOUS | Status: AC | PRN
  Administered 2015-11-26: 100 mL via INTRAVENOUS

## 2015-11-26 MED ORDER — SODIUM CHLORIDE 0.9 % IV BOLUS (SEPSIS)
500.0000 mL | Freq: Once | INTRAVENOUS | Status: AC
  Administered 2015-11-26: 500 mL via INTRAVENOUS

## 2015-11-26 NOTE — ED Provider Notes (Signed)
CSN: [id number]     Arrival date & time 11/26/15  0720 History   First MD Initiated Contact with Patient 11/26/15 0745     Chief Complaint  Patient presents with  . Abdominal Pain     (Consider location/radiation/quality/duration/timing/severity/associated sxs/prior Treatment) HPI Comments: 55 year old female with history of hypertension, diabetes mellitus, CAD presents for left sided abdominal pain.  The patient reports that the pain starts in her upper left abdomen and radiates down the left side.  She reports that it comes and goes and is sharp.  She says she has also felt bloated with the onset of the pain.  Denies shortness of breath, chest pain, worsening of pain with deep breathing.  Denies urinary complaints or changes.  No diarrhea.  No vomiting.  She has had similar pain in the past.  She was told on a previous colonoscopy that she has diverticulosis.  She tried antacids for the pain without relief.  Patient is a 55 y.o. female presenting with abdominal pain.  Abdominal Pain Associated symptoms: nausea   Associated symptoms: no chest pain, no chills, no constipation, no cough, no diarrhea, no dysuria, no fatigue, no fever, no hematuria, no shortness of breath and no vomiting     Past Medical History  Diagnosis Date  . High cholesterol   . Phlebitis   . Asthma   . Chronic bronchitis     "have it whenever I have a cold"  . Shortness of breath on exertion   . Diabetes mellitus type 2 in obese (HCC)   . Herpes simplex     "type 1 & 2"  . GERD (gastroesophageal reflux disease)   . Headache(784.0)   . Angina   . Dysrhythmia 08/03/11    "palpitations"  . Hypertension    Past Surgical History  Procedure Laterality Date  . Salpingectomy      w/ovary removal; "right"  . Salpingoophorectomy    . Coronary angiogram N/A 08/05/2011    Procedure: CORONARY ANGIOGRAM;  Surgeon: Thurmon FairMihai Croitoru, MD;  Location: [facility] CATH [facility];  Service: Cardiovascular;  Laterality: N/A;   History  reviewed. No pertinent family history. Social History  Substance Use Topics  . Smoking status: Never Smoker   . Smokeless tobacco: Never Used  . Alcohol Use: No   OB History    No data available     Review of Systems  Constitutional: Negative for fever, chills and fatigue.  HENT: Negative for congestion and postnasal drip.   Eyes: Negative for visual disturbance.  Respiratory: Negative for cough, chest tightness and shortness of breath.   Cardiovascular: Negative for chest pain and palpitations.  Gastrointestinal: Positive for nausea and abdominal pain. Negative for vomiting, diarrhea and constipation.  Genitourinary: Negative for dysuria, urgency, hematuria and flank pain.  Musculoskeletal: Positive for back pain. Negative for neck pain.  Skin: Negative for rash.  Neurological: Negative for dizziness, light-headedness and headaches.  Hematological: Does not bruise/bleed easily.      Allergies  Hydrocodone; Oxycodone; Morphine and related; Tradjenta; and Vicodin  Home Medications   Prior to Admission medications   Medication Sig Start Date End Date Taking? Authorizing Provider  acetaminophen (TYLENOL) 500 MG tablet Take 500 mg by mouth every 6 (six) hours as needed for moderate pain or headache.   Yes Historical Provider, MD  aspirin EC 81 MG tablet Take 162 mg by mouth 2 (two) times daily as needed for mild pain.   Yes Historical Provider, MD  atenolol (TENORMIN) 50 MG tablet Take 50  mg by mouth daily.   Yes Historical Provider, MD  atorvastatin (LIPITOR) 80 MG tablet Take 80 mg by mouth at bedtime.   Yes Historical Provider, MD  famotidine (PEPCID) 20 MG tablet Take 20 mg by mouth at bedtime.    Yes Historical Provider, MD  fexofenadine (ALLEGRA) 30 MG tablet Take 30 mg by mouth daily as needed (allergies).   Yes Historical Provider, MD  furosemide (LASIX) 40 MG tablet Take 40 mg by mouth 2 (two) times daily as needed for fluid or edema.  09/18/15  Yes Historical Provider, MD    ibuprofen (ADVIL,MOTRIN) 800 MG tablet Take 800 mg by mouth 2 (two) times daily as needed for moderate pain.   Yes Historical Provider, MD  insulin NPH-regular (NOVOLIN 70/30) (70-30) 100 UNIT/ML injection Inject 70-80 Units into the skin 2 (two) times daily with a meal. Take 80 units in the am and Take 70 units at bedtime   Yes Historical Provider, MD  losartan (COZAAR) 25 MG tablet Take 25 mg by mouth daily.   Yes Historical Provider, MD  Multiple Vitamins-Minerals (CENTRUM PO) Take 1 tablet by mouth daily.    Yes Historical Provider, MD  omeprazole (PRILOSEC) 20 MG capsule Take 20 mg by mouth daily. Take 20 mg by mouth for 14 days then stop for 14 days days.   Yes Historical Provider, MD  OVER THE COUNTER MEDICATION Place 1 spray into the nose daily as needed (congestion). "Vicks Nasal Stick"   Yes Historical Provider, MD   BP 138/74 mmHg  Pulse 73  Temp(Src) 98.1 F (36.7 C) (Oral)  Resp 18  SpO2 97%  LMP 04/22/2011 Physical Exam  Constitutional: She is oriented to person, place, and time. She appears well-developed and well-nourished. No distress.  obese  HENT:  Head: Normocephalic and atraumatic.  Right Ear: External ear normal.  Left Ear: External ear normal.  Nose: Nose normal.  Mouth/Throat: Oropharynx is clear and moist. No oropharyngeal exudate.  Eyes: EOM are normal. Pupils are equal, round, and reactive to light.  Neck: Normal range of motion. Neck supple.  Cardiovascular: Normal rate, regular rhythm, normal heart sounds and intact distal pulses.   No murmur heard. Pulmonary/Chest: Effort normal. No respiratory distress. She has no wheezes. She has no rales.  Abdominal: Soft. She exhibits no distension. There is tenderness in the periumbilical area, left upper quadrant and left lower quadrant.  Musculoskeletal: Normal range of motion. She exhibits no edema.       Lumbar back: She exhibits tenderness and pain. She exhibits normal range of motion, no bony tenderness and  normal pulse.       Back:  Neurological: She is alert and oriented to person, place, and time.  Skin: Skin is warm and dry. No rash noted. She is not diaphoretic.  Vitals reviewed.   ED Course  Procedures (including critical care time) Labs Review Labs Reviewed  CBC WITH DIFFERENTIAL/PLATELET - Abnormal; Notable for the following:    WBC 12.0 (*)    Neutro Abs 8.5 (*)    All other components within normal limits  COMPREHENSIVE METABOLIC PANEL - Abnormal; Notable for the following:    Chloride 97 (*)    Glucose, Bld 134 (*)    BUN 22 (*)    Total Protein 8.3 (*)    Alkaline Phosphatase 144 (*)    All other components within normal limits  URINALYSIS, ROUTINE W REFLEX MICROSCOPIC (NOT AT [facility]) - Abnormal; Notable for the following:    APPearance CLOUDY (*)  All other components within normal limits  LIPASE, BLOOD  I-STAT CG4 LACTIC ACID, ED    Imaging Review Ct Abdomen Pelvis W Contrast  11/26/2015  CLINICAL DATA:  Sharp left upper quadrant abdominal pain. EXAM: CT ABDOMEN AND PELVIS WITH CONTRAST TECHNIQUE: Multidetector CT imaging of the abdomen and pelvis was performed using the standard protocol following bolus administration of intravenous contrast. CONTRAST:  ISOVUE-300 IOPAMIDOL (ISOVUE-300) INJECTION 61% COMPARISON:  CT scan dated 03/11/2015 FINDINGS: Lower chest:  Normal. Hepatobiliary: No masses or other significant abnormality. Pancreas: No mass, inflammatory changes, or other significant abnormality. Spleen: Within normal limits in size and appearance. Adrenals/Urinary Tract: No masses identified. No evidence of hydronephrosis. Stomach/Bowel: The bowel appears normal including the terminal ileum and appendix. No diverticular disease. Vascular/Lymphatic: No pathologically enlarged lymph nodes. No evidence of abdominal aortic aneurysm. Reproductive: Multiple small cysts on the left ovary. New 6 mm calcification in the left ovary most likely a benign dystrophic  calcification. Other: None. Musculoskeletal:  Degenerative disc and joint disease at L4-5. IMPRESSION: No acute abnormalities. Electronically Signed   By: Francene Boyers M.D.   On: 11/26/2015 10:35   I have personally reviewed and evaluated these images and lab results as part of my medical decision-making.   EKG Interpretation None      MDM  PAtient was seen and evaluated in stable condition.  Mild leukocytosis on laboratory studies.  CT wthout acute process although degenerative disc disease noted in the lower back.  Patient was non toxic in appearance and refused a PO challenge requesting discharge after review of all results.  Patient was discharged home in stable condition with strict return precautions and instruction to follow up outpatient. Final diagnoses:  Abdominal pain, unspecified abdominal location  Degenerative disc disease, lumbar    1. Abdominal pain  2. Degenerative disc disease, lumbar spine    Leta Baptist, MD 11/26/15 2252

## 2015-11-26 NOTE — ED Notes (Addendum)
Patient states that she would rather go home and eat then to try eating something here.

## 2015-11-26 NOTE — ED Notes (Signed)
Pt c/o constant sharp LUQ abdominal pain radiating to lower abdomen and back since this morning. Denies urinary symptoms. Pt reports "a constant bloat."

## 2015-11-26 NOTE — Discharge Instructions (Signed)
You were seen and evaluated today for your abdominal pain and left-sided pain. Please follow-up with your primary care physician for reevaluation. The exact cause is not clear at this time.  Abdominal Pain, Adult Many things can cause abdominal pain. Usually, abdominal pain is not caused by a disease and will improve without treatment. It can often be observed and treated at home. Your health care provider will do a physical exam and possibly order blood tests and X-rays to help determine the seriousness of your pain. However, in many cases, more time must pass before a clear cause of the pain can be found. Before that point, your health care provider may not know if you need more testing or further treatment. HOME CARE INSTRUCTIONS Monitor your abdominal pain for any changes. The following actions may help to alleviate any discomfort you are experiencing:  Only take over-the-counter or prescription medicines as directed by your health care provider.  Do not take laxatives unless directed to do so by your health care provider.  Try a clear liquid diet (broth, tea, or water) as directed by your health care provider. Slowly move to a bland diet as tolerated. SEEK MEDICAL CARE IF:  You have unexplained abdominal pain.  You have abdominal pain associated with nausea or diarrhea.  You have pain when you urinate or have a bowel movement.  You experience abdominal pain that wakes you in the night.  You have abdominal pain that is worsened or improved by eating food.  You have abdominal pain that is worsened with eating fatty foods.  You have a fever. SEEK IMMEDIATE MEDICAL CARE IF:  Your pain does not go away within 2 hours.  You keep throwing up (vomiting).  Your pain is felt only in portions of the abdomen, such as the right side or the left lower portion of the abdomen.  You pass bloody or black tarry stools. MAKE SURE YOU:  Understand these instructions.  Will watch your  condition.  Will get help right away if you are not doing well or get worse.   This information is not intended to replace advice given to you by your health care provider. Make sure you discuss any questions you have with your health care provider.   Document Released: 02/12/2005 Document Revised: 01/24/2015 Document Reviewed: 01/12/2013 Elsevier Interactive Patient Education [employer]2016 Elsevier Inc.

## 2016-06-28 ENCOUNTER — Encounter (HOSPITAL_COMMUNITY): Payer: Self-pay | Admitting: Emergency Medicine

## 2016-06-28 ENCOUNTER — Emergency Department (HOSPITAL_COMMUNITY): Payer: BLUE CROSS/BLUE SHIELD

## 2016-06-28 ENCOUNTER — Emergency Department (HOSPITAL_COMMUNITY)
Admission: EM | Admit: 2016-06-28 | Discharge: 2016-06-28 | Disposition: A | Discharge status: Home / Self Care | Payer: BLUE CROSS/BLUE SHIELD | Attending: Emergency Medicine | Admitting: Emergency Medicine

## 2016-06-28 DIAGNOSIS — E119 Type 2 diabetes mellitus without complications: Secondary | ICD-10-CM | POA: Insufficient documentation

## 2016-06-28 DIAGNOSIS — J45909 Unspecified asthma, uncomplicated: Secondary | ICD-10-CM | POA: Diagnosis not present

## 2016-06-28 DIAGNOSIS — R109 Unspecified abdominal pain: Secondary | ICD-10-CM | POA: Diagnosis present

## 2016-06-28 DIAGNOSIS — I2510 Atherosclerotic heart disease of native coronary artery without angina pectoris: Secondary | ICD-10-CM | POA: Insufficient documentation

## 2016-06-28 DIAGNOSIS — R350 Frequency of micturition: Secondary | ICD-10-CM

## 2016-06-28 DIAGNOSIS — K5900 Constipation, unspecified: Secondary | ICD-10-CM

## 2016-06-28 DIAGNOSIS — R10A1 Flank pain, right side: Secondary | ICD-10-CM

## 2016-06-28 DIAGNOSIS — Z7982 Long term (current) use of aspirin: Secondary | ICD-10-CM | POA: Insufficient documentation

## 2016-06-28 LAB — COMPREHENSIVE METABOLIC PANEL
ALT: 20 U/L (ref 14–54)
AST: 18 U/L (ref 15–41)
Albumin: 3.6 g/dL (ref 3.5–5.0)
Alkaline Phosphatase: 121 U/L (ref 38–126)
Anion gap: 4 — ABNORMAL LOW (ref 5–15)
BUN: 15 mg/dL (ref 6–20)
CO2: 30 mmol/L (ref 22–32)
Calcium: 9 mg/dL (ref 8.9–10.3)
Chloride: 101 mmol/L (ref 101–111)
Creatinine, Ser: 0.65 mg/dL (ref 0.44–1.00)
GFR calc Af Amer: >60 mL/min (ref >60)
GFR calc non Af Amer: >60 mL/min (ref >60)
Glucose, Bld: 109 mg/dL — ABNORMAL HIGH (ref 65–99)
Potassium: 4.3 mmol/L (ref 3.5–5.1)
Sodium: 135 mmol/L (ref 135–145)
Total Bilirubin: 0.4 mg/dL (ref 0.3–1.2)
Total Protein: 7.4 g/dL (ref 6.5–8.1)

## 2016-06-28 LAB — URINALYSIS, ROUTINE W REFLEX MICROSCOPIC
Bilirubin Urine: NEGATIVE
Glucose, UA: NEGATIVE mg/dL
Hgb urine dipstick: NEGATIVE
Ketones, ur: NEGATIVE mg/dL
Leukocytes, UA: NEGATIVE
Nitrite: NEGATIVE
Protein, ur: NEGATIVE mg/dL
Specific Gravity, Urine: 1.008 (ref 1.005–1.030)
pH: 5 (ref 5.0–8.0)

## 2016-06-28 LAB — CBC
HCT: 36.2 % (ref 36.0–46.0)
Hemoglobin: 12.1 g/dL (ref 12.0–15.0)
MCH: 28.2 pg (ref 26.0–34.0)
MCHC: 33.4 g/dL (ref 30.0–36.0)
MCV: 84.4 fL (ref 78.0–100.0)
Platelets: 324 10*3/uL (ref 150–400)
RBC: 4.29 MIL/uL (ref 3.87–5.11)
RDW: 13.7 % (ref 11.5–15.5)
WBC: 11.4 10*3/uL — ABNORMAL HIGH (ref 4.0–10.5)

## 2016-06-28 LAB — I-STAT BETA HCG BLOOD, ED (MC, WL, AP ONLY): I-stat hCG, quantitative: <5 m[IU]/mL (ref <5)

## 2016-06-28 LAB — LIPASE, BLOOD: Lipase: 17 U/L (ref 11–51)

## 2016-06-28 MED ORDER — IOPAMIDOL (ISOVUE-300) INJECTION 61%
INTRAVENOUS | Status: AC
  Filled 2016-06-28: qty 30

## 2016-06-28 MED ORDER — POLYETHYLENE GLYCOL 3350 17 G PO PACK: 17.0000 g | PACK | Freq: Every day | ORAL | 0 refills | Status: DC

## 2016-06-28 MED ORDER — KETOROLAC TROMETHAMINE 30 MG/ML IJ SOLN
30.0000 mg | Freq: Once | INTRAMUSCULAR | Status: AC
  Administered 2016-06-28: 30 mg via INTRAVENOUS
  Filled 2016-06-28: qty 1

## 2016-06-28 MED ORDER — IOPAMIDOL (ISOVUE-300) INJECTION 61%
30.0000 mL | Freq: Once | INTRAVENOUS | Status: AC | PRN
  Administered 2016-06-28: 30 mL via ORAL

## 2016-06-28 NOTE — ED Provider Notes (Signed)
[facility]-EMERGENCY DEPT Provider Note   CSN: [id number] Arrival date & time: 06/28/16  1024     History   Chief Complaint Chief Complaint  Patient presents with  . Flank Pain    HPI Elizabeth Huber is a 56 y.o. female with a PMHx of asthma, chronic bronchitis, DM2, GERD, headaches, HTN, HLD, and CAD, with a PSHx of R-sided salpingoophorectomy, who presents to the ED with complaints of right flank pain began yesterday. Patient describes the pain as 7/10 intermittent sharp right flank pain radiating to the right lower quadrant, worse with movement and walking, and mildly improved with ibuprofen and Tylenol. Associated symptoms include increased urinary frequency. Reports that she's had some constipation recently; however her last BM was this morning. She denies any past medical history of kidney stones, however states that her parents both had history of kidney stones. She admits to fairly regular NSAID use. She is postmenopausal, has not had a menses in several years, and denies any sexual activity in multiple years. She denies fevers, chills, CP, SOB, N/V/D/C, obstipation, melena, hematochezia, hematuria, dysuria, malodorous urine, vaginal bleeding/discharge, myalgias, arthralgias, numbness, tingling, focal weakness, or any other complaints at this time. Denies recent travel, sick contacts, suspicious food intake, or EtOH use. Denies any other abd surgeries aside from R salpingoophorectomy.    The history is provided by the patient and medical records. No language interpreter was used.  Flank Pain  This is a new problem. The current episode started yesterday. The problem occurs daily (intermittent). The problem has not changed since onset.Associated symptoms include abdominal pain. Pertinent negatives include no chest pain and no shortness of breath. The symptoms are aggravated by walking. The symptoms are relieved by NSAIDs and acetaminophen. She has tried acetaminophen (and ibuprofen) for the  symptoms. The treatment provided mild relief.    Past Medical History:  Diagnosis Date  . Angina   . Asthma   . Chronic bronchitis    "have it whenever I have a cold"  . Diabetes mellitus type 2 in obese (HCC)   . Dysrhythmia 08/03/11   "palpitations"  . GERD (gastroesophageal reflux disease)   . Headache(784.0)   . Herpes simplex    "type 1 & 2"  . High cholesterol   . Hypertension   . Phlebitis   . Shortness of breath on exertion     Patient Active Problem List   Diagnosis Date Noted  . CAD (coronary artery disease) 40% LAD afte Dx1 08/05/11 08/06/2011  . PSVT (paroxysmal supraventricular tachycardia) (HCC) 08/06/2011  . Chest pain 08/04/2011  . Diabetes mellitus (HCC) 08/04/2011  . Hyperlipidemia,  08/04/2011  . Tachycardia, sinus tach on admission 08/04/2011  . Obesity 08/04/2011  . HTN (hypertension) 08/04/2011    Past Surgical History:  Procedure Laterality Date  . CORONARY ANGIOGRAM N/A 08/05/2011   Procedure: CORONARY ANGIOGRAM;  Surgeon: Thurmon Fair, MD;  Location: [facility] CATH [facility];  Service: Cardiovascular;  Laterality: N/A;  . SALPINGECTOMY     w/ovary removal; "right"  . SALPINGOOPHORECTOMY      OB History    No data available       Home Medications    Prior to Admission medications   Medication Sig Start Date End Date Taking? Authorizing Provider  acetaminophen (TYLENOL) 500 MG tablet Take 500 mg by mouth every 6 (six) hours as needed for moderate pain or headache.    Historical Provider, MD  aspirin EC 81 MG tablet Take 162 mg by mouth 2 (two) times daily as  needed for mild pain.    Historical Provider, MD  atenolol (TENORMIN) 50 MG tablet Take 50 mg by mouth daily.    Historical Provider, MD  atorvastatin (LIPITOR) 80 MG tablet Take 80 mg by mouth at bedtime.    Historical Provider, MD  famotidine (PEPCID) 20 MG tablet Take 20 mg by mouth at bedtime.     Historical Provider, MD  fexofenadine (ALLEGRA) 30 MG tablet Take 30 mg by mouth daily as needed  (allergies).    Historical Provider, MD  furosemide (LASIX) 40 MG tablet Take 40 mg by mouth 2 (two) times daily as needed for fluid or edema.  09/18/15   Historical Provider, MD  ibuprofen (ADVIL,MOTRIN) 800 MG tablet Take 800 mg by mouth 2 (two) times daily as needed for moderate pain.    Historical Provider, MD  insulin NPH-regular (NOVOLIN 70/30) (70-30) 100 UNIT/ML injection Inject 70-80 Units into the skin 2 (two) times daily with a meal. Take 80 units in the am and Take 70 units at bedtime    Historical Provider, MD  losartan (COZAAR) 25 MG tablet Take 25 mg by mouth daily.    Historical Provider, MD  Multiple Vitamins-Minerals (CENTRUM PO) Take 1 tablet by mouth daily.     Historical Provider, MD  omeprazole (PRILOSEC) 20 MG capsule Take 20 mg by mouth daily. Take 20 mg by mouth for 14 days then stop for 14 days days.    Historical Provider, MD  OVER THE COUNTER MEDICATION Place 1 spray into the nose daily as needed (congestion). "Vicks Nasal Stick"    Historical Provider, MD    Family History History reviewed. No pertinent family history.  Social History Social History  Substance Use Topics  . Smoking status: Never Smoker  . Smokeless tobacco: Never Used  . Alcohol use No     Allergies   Hydrocodone; Oxycodone; Morphine and related; Tradjenta [linagliptin]; and Vicodin [hydrocodone-acetaminophen]   Review of Systems Review of Systems  Constitutional: Negative for chills and fever.  Respiratory: Negative for shortness of breath.   Cardiovascular: Negative for chest pain.  Gastrointestinal: Positive for abdominal pain and constipation (recently; however last BM this morning). Negative for blood in stool, diarrhea, nausea and vomiting.  Genitourinary: Positive for flank pain and frequency. Negative for dysuria, hematuria, vaginal bleeding and vaginal discharge.  Musculoskeletal: Negative for arthralgias and myalgias.  Skin: Negative for color change.  Allergic/Immunologic:  Positive for immunocompromised state (DM2).  Neurological: Negative for weakness and numbness.  Psychiatric/Behavioral: Negative for confusion.   10 Systems reviewed and are negative for acute change except as noted in the HPI.   Physical Exam Updated Vital Signs BP 149/87   Pulse 78   Temp 98.2 F (36.8 C)   Resp 20   Wt (!) 158.8 kg   LMP 05/05/2011   SpO2 95%   BMI 60.08 kg/m   Physical Exam  Constitutional: She is oriented to person, place, and time. Vital signs are normal. She appears well-developed and well-nourished.  Non-toxic appearance. No distress.  Afebrile, nontoxic, NAD  HENT:  Head: Normocephalic and atraumatic.  Mouth/Throat: Oropharynx is clear and moist and mucous membranes are normal.  Eyes: Conjunctivae and EOM are normal. Right eye exhibits no discharge. Left eye exhibits no discharge.  Neck: Normal range of motion. Neck supple.  Cardiovascular: Normal rate, regular rhythm, normal heart sounds and intact distal pulses.  Exam reveals no gallop and no friction rub.   No murmur heard. Pulmonary/Chest: Effort normal and breath sounds  normal. No respiratory distress. She has no decreased breath sounds. She has no wheezes. She has no rhonchi. She has no rales.  Abdominal: Soft. Normal appearance and bowel sounds are normal. She exhibits no distension. There is tenderness in the right lower quadrant and suprapubic area. There is CVA tenderness and tenderness at McBurney's point. There is no rigidity, no rebound, no guarding and negative Murphy's sign.  Morbidly obese which severely limits exam, but overall soft, without obvious distension, +BS throughout, with mild RLQ and suprapubic TTP, tracking back towards the R flank region, no r/g/r, neg murphy's, +mcburney's point area tenderness however body habitus makes this really difficult to determine exactly, +R sided CVA TTP   Musculoskeletal: Normal range of motion.  Neurological: She is alert and oriented to person,  place, and time. She has normal strength. No sensory deficit.  Skin: Skin is warm, dry and intact. No rash noted.  Psychiatric: She has a normal mood and affect.  Nursing note and vitals reviewed.    ED Treatments / Results  Labs (all labs ordered are listed, but only abnormal results are displayed) Labs Reviewed  COMPREHENSIVE METABOLIC PANEL - Abnormal; Notable for the following:       Result Value   Glucose, Bld 109 (*)    Anion gap 4 (*)    All other components within normal limits  CBC - Abnormal; Notable for the following:    WBC 11.4 (*)    All other components within normal limits  LIPASE, BLOOD  URINALYSIS, ROUTINE W REFLEX MICROSCOPIC  I-STAT BETA HCG BLOOD, ED ([facility], [facility], [facility] ONLY)    EKG  EKG Interpretation None       Radiology Ct Abdomen Pelvis Wo Contrast  Result Date: 06/28/2016 CLINICAL DATA:  Left-sided abdominal pain. EXAM: CT ABDOMEN AND PELVIS WITHOUT CONTRAST TECHNIQUE: Multidetector CT imaging of the abdomen and pelvis was performed following the standard protocol without IV contrast. COMPARISON:  November 26, 2015 FINDINGS: Lower chest: No acute abnormality. Hepatobiliary: No focal liver abnormality is seen. No gallstones, gallbladder wall thickening, or biliary dilatation. Pancreas: Unremarkable. No pancreatic ductal dilatation or surrounding inflammatory changes. Spleen: Normal in size without focal abnormality. Adrenals/Urinary Tract: Adrenal glands are unremarkable. Kidneys are normal, without renal calculi, focal lesion, or hydronephrosis. Bladder is unremarkable. Stomach/Bowel: Stomach is within normal limits. Appendix appears normal. No evidence of bowel wall thickening, distention, or inflammatory changes. Vascular/Lymphatic: No significant vascular findings are present. No enlarged abdominal or pelvic lymph nodes. Reproductive: Uterus and bilateral adnexa are unremarkable. Other: No abdominal wall hernia or abnormality. No abdominopelvic ascites.  Musculoskeletal: No acute or significant osseous findings. IMPRESSION: 1. No acute abnormalities to explain the patient's symptoms. Electronically Signed   By: Gerome Samavid  Williams III M.D   On: 06/28/2016 15:28    Procedures Procedures (including critical care time)  Medications Ordered in ED Medications  iopamidol (ISOVUE-300) 61 % injection (not administered)  ketorolac (TORADOL) 30 MG/ML injection 30 mg (30 mg Intravenous Given 06/28/16 1322)  iopamidol (ISOVUE-300) 61 % injection 30 mL (30 mLs Oral Contrast Given 06/28/16 1345)     Initial Impression / Assessment and Plan / ED Course  I have reviewed the triage vital signs and the nursing notes.  Pertinent labs & imaging results that were available during my care of the patient were reviewed by me and considered in my medical decision making (see chart for details).     56 y.o. female here with R flank pain radiating to RLQ onset yesterday with  increased urinary frequency. On exam, mild R flank TTP, mild RLQ TTP near mcburney's point however body habitus severely limits good evaluation, nonperitoneal exam. Hx of R ovary removal, no pelvic/vaginal complaints, doubt need for pelvic exam at this time. Lipase WNL, CMP unremarkable, CBC with mildly elevated WBC 11.4. U/A pending. Will proceed with CT with oral contrast only to evaluate for nephrolithiasis vs appendicitis vs other etiology of symptoms. Will also obtain betaHCG. Will give toradol and reassess shortly.   3:34 PM Pt feeling better. U/A unremarkable. BetaHCG negative. CT negative; moderate stool burden noted on my evaluation of the images, particularly in the R colon, which could be the source of her pain. Advised miralax daily to achieve daily soft stools, increase fiber and water intake in diet. Alternate between tylenol/motrin as needed for pain. F/up with PCP in 1 week for recheck of symptoms. I explained the diagnosis and have given explicit precautions to return to the ER including  for any other new or worsening symptoms. The patient understands and accepts the medical plan as it's been dictated and I have answered their questions. Discharge instructions concerning home care and prescriptions have been given. The patient is STABLE and is discharged to home in good condition.   Final Clinical Impressions(s) / ED Diagnoses   Final diagnoses:  Right flank pain  Urinary frequency  Right lateral abdominal pain  Constipation, unspecified constipation type    New Prescriptions New Prescriptions   POLYETHYLENE GLYCOL (MIRALAX / GLYCOLAX) PACKET    Take 17 g by mouth daily.     [address] PA-C 06/28/16 1536    Heide Scales, MD 06/29/16 [phone]

## 2016-06-28 NOTE — Discharge Instructions (Signed)
Your work up today has been reassuring. It appears as though you're constipated, use miralax as directed to achieve having daily soft bowel movements; increase water and fiber intake in your diet. Stay well hydrated. Alternate between tylenol and motrin as needed for pain. Follow up with your regular doctor in 1 week for recheck of symptoms.   Abdominal (belly) pain can be caused by many things. Your caregiver performed an examination and possibly ordered blood/urine tests and imaging (CT scan, x-rays, ultrasound). Many cases can be observed and treated at home after initial evaluation in the emergency department. Even though you are being discharged home, abdominal pain can be unpredictable. Therefore, you need a repeated exam if your pain does not resolve, returns, or worsens. Most patients with abdominal pain don't have to be admitted to the hospital or have surgery, but serious problems like appendicitis and gallbladder attacks can start out as nonspecific pain. Many abdominal conditions cannot be diagnosed in one visit, so follow-up evaluations are very important. SEEK IMMEDIATE MEDICAL ATTENTION IF YOU DEVELOP ANY OF THE FOLLOWING SYMPTOMS: The pain does not go away or becomes severe.  A temperature above 101 develops.  Repeated vomiting occurs (multiple episodes).  The pain becomes localized to portions of the abdomen. The right side could possibly be appendicitis. In an adult, the left lower portion of the abdomen could be colitis or diverticulitis.  Blood is being passed in stools or vomit (bright red or black tarry stools).  Return also if you develop chest pain, difficulty breathing, dizziness or fainting, or become confused, poorly responsive, or inconsolable (young children). The constipation stays for more than 4 days.  There is belly (abdominal) or rectal pain.  You do not seem to be getting better.

## 2016-06-28 NOTE — ED Triage Notes (Signed)
Pt reports R flank pain radiating around to RLQ since last night. No dysuria or hematuria. Also having HA.

## 2016-08-10 ENCOUNTER — Emergency Department (HOSPITAL_COMMUNITY): Payer: BLUE CROSS/BLUE SHIELD

## 2016-08-10 ENCOUNTER — Encounter (HOSPITAL_COMMUNITY): Payer: Self-pay

## 2016-08-10 ENCOUNTER — Emergency Department (HOSPITAL_COMMUNITY)
Admission: EM | Admit: 2016-08-10 | Discharge: 2016-08-10 | Disposition: A | Discharge status: Home / Self Care | Payer: BLUE CROSS/BLUE SHIELD | Attending: Emergency Medicine | Admitting: Emergency Medicine

## 2016-08-10 DIAGNOSIS — I10 Essential (primary) hypertension: Secondary | ICD-10-CM | POA: Insufficient documentation

## 2016-08-10 DIAGNOSIS — Z794 Long term (current) use of insulin: Secondary | ICD-10-CM | POA: Insufficient documentation

## 2016-08-10 DIAGNOSIS — R109 Unspecified abdominal pain: Secondary | ICD-10-CM | POA: Diagnosis present

## 2016-08-10 DIAGNOSIS — I2510 Atherosclerotic heart disease of native coronary artery without angina pectoris: Secondary | ICD-10-CM | POA: Insufficient documentation

## 2016-08-10 DIAGNOSIS — E119 Type 2 diabetes mellitus without complications: Secondary | ICD-10-CM | POA: Diagnosis not present

## 2016-08-10 DIAGNOSIS — R1032 Left lower quadrant pain: Secondary | ICD-10-CM | POA: Insufficient documentation

## 2016-08-10 DIAGNOSIS — R000 Tachycardia, unspecified: Secondary | ICD-10-CM | POA: Insufficient documentation

## 2016-08-10 DIAGNOSIS — Z7982 Long term (current) use of aspirin: Secondary | ICD-10-CM | POA: Diagnosis not present

## 2016-08-10 LAB — CBC
HCT: 40.1 % (ref 36.0–46.0)
Hemoglobin: 13.6 g/dL (ref 12.0–15.0)
MCH: 27.7 pg (ref 26.0–34.0)
MCHC: 33.9 g/dL (ref 30.0–36.0)
MCV: 81.7 fL (ref 78.0–100.0)
Platelets: 351 10*3/uL (ref 150–400)
RBC: 4.91 MIL/uL (ref 3.87–5.11)
RDW: 13.1 % (ref 11.5–15.5)
WBC: 13.4 10*3/uL — ABNORMAL HIGH (ref 4.0–10.5)

## 2016-08-10 LAB — COMPREHENSIVE METABOLIC PANEL
ALT: 18 U/L (ref 14–54)
AST: 20 U/L (ref 15–41)
Albumin: 3.6 g/dL (ref 3.5–5.0)
Alkaline Phosphatase: 129 U/L — ABNORMAL HIGH (ref 38–126)
Anion gap: 8 (ref 5–15)
BUN: 16 mg/dL (ref 6–20)
CO2: 24 mmol/L (ref 22–32)
Calcium: 8.7 mg/dL — ABNORMAL LOW (ref 8.9–10.3)
Chloride: 105 mmol/L (ref 101–111)
Creatinine, Ser: 0.71 mg/dL (ref 0.44–1.00)
GFR calc Af Amer: >60 mL/min (ref >60)
GFR calc non Af Amer: >60 mL/min (ref >60)
Glucose, Bld: 159 mg/dL — ABNORMAL HIGH (ref 65–99)
Potassium: 4 mmol/L (ref 3.5–5.1)
Sodium: 137 mmol/L (ref 135–145)
Total Bilirubin: 0.9 mg/dL (ref 0.3–1.2)
Total Protein: 7.9 g/dL (ref 6.5–8.1)

## 2016-08-10 LAB — URINALYSIS, ROUTINE W REFLEX MICROSCOPIC
Bilirubin Urine: NEGATIVE
Glucose, UA: NEGATIVE mg/dL
Hgb urine dipstick: NEGATIVE
Ketones, ur: 5 mg/dL — AB
Leukocytes, UA: NEGATIVE
Nitrite: NEGATIVE
Protein, ur: NEGATIVE mg/dL
Specific Gravity, Urine: 1.025 (ref 1.005–1.030)
pH: 5 (ref 5.0–8.0)

## 2016-08-10 LAB — TSH: TSH: 0.728 u[IU]/mL (ref 0.350–4.500)

## 2016-08-10 LAB — LIPASE, BLOOD: Lipase: 16 U/L (ref 11–51)

## 2016-08-10 MED ORDER — KETOROLAC TROMETHAMINE 15 MG/ML IJ SOLN
15.0000 mg | Freq: Once | INTRAMUSCULAR | Status: AC
  Administered 2016-08-10: 15 mg via INTRAVENOUS
  Filled 2016-08-10: qty 1

## 2016-08-10 MED ORDER — IOPAMIDOL (ISOVUE-300) INJECTION 61%
INTRAVENOUS | Status: AC
  Administered 2016-08-10: 100 mL via INTRAVENOUS
  Filled 2016-08-10: qty 100

## 2016-08-10 MED ORDER — FENTANYL CITRATE (PF) 100 MCG/2ML IJ SOLN: 50.0000 ug | Freq: Once | INTRAMUSCULAR | Status: DC

## 2016-08-10 MED ORDER — ONDANSETRON HCL 4 MG/2ML IJ SOLN
4.0000 mg | Freq: Once | INTRAMUSCULAR | Status: DC
Start: 2016-08-10 — End: 2016-08-10

## 2016-08-10 MED ORDER — ONDANSETRON HCL 4 MG/2ML IJ SOLN
4.0000 mg | Freq: Once | INTRAMUSCULAR | Status: AC
  Administered 2016-08-10: 4 mg via INTRAVENOUS
  Filled 2016-08-10: qty 2

## 2016-08-10 MED ORDER — CEPHALEXIN 500 MG PO CAPS: 500.0000 mg | ORAL_CAPSULE | Freq: Four times a day (QID) | ORAL | 0 refills | Status: DC

## 2016-08-10 MED ORDER — CEPHALEXIN 500 MG PO CAPS
500.0000 mg | ORAL_CAPSULE | Freq: Once | ORAL | Status: AC
  Administered 2016-08-10: 500 mg via ORAL
  Filled 2016-08-10: qty 1

## 2016-08-10 NOTE — ED Notes (Signed)
Pt out of room in radiology at this time

## 2016-08-10 NOTE — Discharge Instructions (Addendum)
We saw you in the ER for the abdominal pain. All the results in the ER are normal, labs and imaging. We are not sure what is causing your symptoms. The workup in the ER is not complete, and is limited to screening for life threatening and emergent conditions only, so please see a primary care doctor for further evaluation.  START THE ANTIBIOTICS PRESCRIBED - IT SHOULD COVER FOR A URINARY INFECTION.  Please return to the ER if your symptoms worsen; you have increased pain, fevers, chills, inability to keep any medications down, confusion. Otherwise see the outpatient doctor as requested.\  ALSO  -WE NOTED THAT YOUR HEART RATE IS A BIT FASTER. THIS IN ITSELF NEEDS EVALUATION BY YOUR DOCTOR  - PLEASE SEE THEM THIS WEEK.   Return to the ER if you have dizziness, shortness of breath, near fainting or fainting spell.

## 2016-08-10 NOTE — ED Triage Notes (Signed)
Pt here with abdominal pain and nausea.  Pt having normal bm.  Chills.  No fever.  No burning with urination.

## 2016-08-11 NOTE — ED Provider Notes (Signed)
[facility]-EMERGENCY DEPT Provider Note   CSN: [id number] Arrival date & time: 08/10/16  1145     History   Chief Complaint Chief Complaint  Patient presents with  . Abdominal Pain  . Nausea    HPI Elizabeth Huber is a 56 y.o. female.  HPI Pt comes in with cc of abd pain. Pt has hx of DM, HTN, HL and she is morbidly obese. Pt reports that she is having L sided abd pain. Abd pain is in the flank region and radiates to wards the groin. Pt reports that the pain has been present for 2-3 days, and is constant, with waxing and waning intensity. Pt has associated chills and nausea. No sweats. Pt has no diarrhea, uti like symptoms.   Past Medical History:  Diagnosis Date  . Angina   . Asthma   . Chronic bronchitis    "have it whenever I have a cold"  . Diabetes mellitus type 2 in obese (HCC)   . Dysrhythmia 08/03/11   "palpitations"  . GERD (gastroesophageal reflux disease)   . Headache(784.0)   . Herpes simplex    "type 1 & 2"  . High cholesterol   . Hypertension   . Phlebitis   . Shortness of breath on exertion     Patient Active Problem List   Diagnosis Date Noted  . CAD (coronary artery disease) 40% LAD afte Dx1 08/05/11 08/06/2011  . PSVT (paroxysmal supraventricular tachycardia) (HCC) 08/06/2011  . Chest pain 08/04/2011  . Diabetes mellitus (HCC) 08/04/2011  . Hyperlipidemia,  08/04/2011  . Tachycardia, sinus tach on admission 08/04/2011  . Obesity 08/04/2011  . HTN (hypertension) 08/04/2011    Past Surgical History:  Procedure Laterality Date  . CORONARY ANGIOGRAM N/A 08/05/2011   Procedure: CORONARY ANGIOGRAM;  Surgeon: Thurmon Fair, MD;  Location: [facility] CATH LAB;  Service: Cardiovascular;  Laterality: N/A;  . SALPINGECTOMY     w/ovary removal; "right"  . SALPINGOOPHORECTOMY    . TOENAIL EXCISION      OB History    No data available       Home Medications    Prior to Admission medications   Medication Sig Start Date End Date Taking? Authorizing Provider    acetaminophen (TYLENOL) 500 MG tablet Take 1,000 mg by mouth every 6 (six) hours as needed for moderate pain or headache.    Yes Historical Provider, MD  aspirin EC 81 MG tablet Take 162 mg by mouth 2 (two) times daily as needed for mild pain.   Yes Historical Provider, MD  atenolol (TENORMIN) 50 MG tablet Take 50 mg by mouth daily.   Yes Historical Provider, MD  Camphor-Eucalyptus-Menthol (VICKS VAPORUB) 4.7-1.2-2.6 % OINT Apply 1 application topically daily.   Yes Historical Provider, MD  ciprofloxacin (CIPRO) 500 MG tablet Take 500 mg by mouth 2 (two) times daily. 08/04/16  Yes Historical Provider, MD  guaifenesin (ROBITUSSIN) 100 MG/5ML syrup Take 200 mg by mouth 3 (three) times daily as needed for cough.   Yes Historical Provider, MD  insulin NPH-regular (NOVOLIN 70/30) (70-30) 100 UNIT/ML injection Inject 80 Units into the skin 2 (two) times daily with a meal.    Yes Historical Provider, MD  losartan (COZAAR) 25 MG tablet Take 25 mg by mouth daily.   Yes Historical Provider, MD  Multiple Vitamins-Minerals (CENTRUM PO) Take 1 tablet by mouth daily.    Yes Historical Provider, MD  omeprazole (PRILOSEC) 20 MG capsule Take 20 mg by mouth 2 (two) times daily before a meal.  Yes Historical Provider, MD  polyethylene glycol (MIRALAX / GLYCOLAX) packet Take 17 g by mouth daily. 06/28/16  Yes Mercedes Street, PA-C  cephALEXin (KEFLEX) 500 MG capsule Take 1 capsule (500 mg total) by mouth 4 (four) times daily. 08/10/16   Derwood Kaplan, MD    Family History History reviewed. No pertinent family history.  Social History Social History  Substance Use Topics  . Smoking status: Never Smoker  . Smokeless tobacco: Never Used  . Alcohol use No     Allergies   Hydrocodone; Oxycodone; Morphine and related; Tradjenta [linagliptin]; and Vicodin [hydrocodone-acetaminophen]   Review of Systems Review of Systems  ROS 10 Systems reviewed and are negative for acute change except as noted in the  HPI.     Physical Exam Updated Vital Signs BP (!) 117/57   Pulse (!) 114   Temp 99 F (37.2 C) (Oral)   Resp 18   LMP 05/05/2011   SpO2 93%   Physical Exam  Constitutional: She is oriented to person, place, and time. She appears well-developed.  HENT:  Head: Normocephalic and atraumatic.  Eyes: Conjunctivae and EOM are normal. Pupils are equal, round, and reactive to light.  Neck: Normal range of motion. Neck supple.  Cardiovascular: Normal rate, regular rhythm and normal heart sounds.   Pulmonary/Chest: Effort normal and breath sounds normal. No respiratory distress.  Abdominal: Soft. Bowel sounds are normal. She exhibits no distension. There is tenderness. There is guarding. There is no rebound.  Neurological: She is alert and oriented to person, place, and time.  Skin: Skin is warm and dry.  Nursing note and vitals reviewed.    ED Treatments / Results  Labs (all labs ordered are listed, but only abnormal results are displayed) Labs Reviewed  COMPREHENSIVE METABOLIC PANEL - Abnormal; Notable for the following:       Result Value   Glucose, Bld 159 (*)    Calcium 8.7 (*)    Alkaline Phosphatase 129 (*)    All other components within normal limits  CBC - Abnormal; Notable for the following:    WBC 13.4 (*)    All other components within normal limits  URINALYSIS, ROUTINE W REFLEX MICROSCOPIC - Abnormal; Notable for the following:    Ketones, ur 5 (*)    All other components within normal limits  URINE CULTURE  LIPASE, BLOOD  TSH    EKG  EKG Interpretation  Date/Time:  Sunday August 10 2016 18:15:38 EDT Ventricular Rate:  109 PR Interval:    QRS Duration: 90 QT Interval:  343 QTC Calculation: 462 R Axis:   45 Text Interpretation:  Sinus tachycardia Low voltage, precordial leads No acute changes No significant change since last tracing Confirmed by Rhunette Croft, MD, Janey Genta [phone]) on 08/10/2016 6:19:27 PM Also confirmed by Rhunette Croft, MD, Janey Genta [phone]), editor [place]  [place], [place] [phone])  on 08/11/2016 7:04:35 AM       Radiology Dg Chest 2 View  Result Date: 08/10/2016 CLINICAL DATA:  Hypoxia EXAM: CHEST  2 VIEW COMPARISON:  03/11/2015 FINDINGS: Cardiomediastinal silhouette is stable. Cardiomediastinal silhouette is unremarkable. No infiltrate or pleural effusion. No pulmonary edema. Mild degenerative changes lower thoracic spine. IMPRESSION: No active cardiopulmonary disease. Mild degenerative changes lower thoracic spine. Electronically Signed   By: Natasha Mead M.D.   On: 08/10/2016 16:52   Ct Abdomen Pelvis W Contrast  Result Date: 08/10/2016 CLINICAL DATA:  Progressive RT sided abdominal pain today with nausea. EXAM: CT ABDOMEN AND PELVIS WITH CONTRAST TECHNIQUE: Multidetector CT imaging  of the abdomen and pelvis was performed using the standard protocol following bolus administration of intravenous contrast. CONTRAST:  1 ISOVUE-300 IOPAMIDOL (ISOVUE-300) INJECTION 61% COMPARISON:  06/28/2016 FINDINGS: Lower chest: Lung bases are clear. Heart size is normal. No imaged pericardial effusion or significant coronary artery calcifications. Study quality is degraded by patient motion artifact. Hepatobiliary: No focal liver abnormality is seen. No gallstones, gallbladder wall thickening, or biliary dilatation. Pancreas: Unremarkable. No pancreatic ductal dilatation or surrounding inflammatory changes. Spleen: Normal in size without focal abnormality. Adrenals/Urinary Tract: Adrenal glands are unremarkable. Kidneys are normal, without renal calculi, focal lesion, or hydronephrosis. Bladder is unremarkable. Stomach/Bowel: The stomach and small bowel loops are normal in appearance. Colon is normal in appearance. Average stool burden. The appendix is well seen and has a normal appearance. Vascular/Lymphatic: No significant vascular findings are present. No enlarged abdominal or pelvic lymph nodes. Reproductive: Uterus is present and is anteflexed. Left ovary has a normal  appearance. Status post right salpingo-oophorectomy. Other: No abdominal wall hernia or abnormality. No abdominopelvic ascites. Musculoskeletal: There is facet hypertrophy and degenerative change in the lower lumbar spine. Bilateral SI joint sclerosis. No suspicious lytic or blastic lesions are identified. IMPRESSION: 1.  No evidence for acute  abnormality. 2. Normal appendix. 3. Average stool burden. 4. Status post right salpingo-oophorectomy. 5. Degenerative changes in the lower lumbar spine and SI joints. Electronically Signed   By: Norva PavlovElizabeth  Brown M.D.   On: 08/10/2016 16:21    Procedures Procedures (including critical care time)  Medications Ordered in ED Medications  ondansetron (ZOFRAN) injection 4 mg (4 mg Intravenous Given 08/10/16 1613)  ketorolac (TORADOL) 15 MG/ML injection 15 mg (15 mg Intravenous Given 08/10/16 1613)  iopamidol (ISOVUE-300) 61 % injection (100 mLs Intravenous Contrast Given 08/10/16 1551)  cephALEXin (KEFLEX) capsule 500 mg (500 mg Oral Given 08/10/16 1802)     Initial Impression / Assessment and Plan / ED Course  I have reviewed the triage vital signs and the nursing notes.  Pertinent labs & imaging results that were available during my care of the patient were reviewed by me and considered in my medical decision making (see chart for details).  Clinical Course as of Aug 11 1753  Wynelle LinkSun Aug 10, 2016  1749 Pt has been tachycardic here. Pt has no hx of PE, DVT and denies any exogenous hormone (testosterone / estrogen) use, long distance travels or surgery in the past 6 weeks, active cancer, recent immobilization.  Pt's last 2 visits, including one from earlier this year. HR were normal. PT already had a CT abdomen - I dont think it is a good idea to get CT PE. Pt made aware of the increased HR. She will see her PCP soon. We ordered TSH. Strict ER return precautions have been discussed, and patient is agreeing with the plan and is comfortable with the workup done and  the recommendations from the ER.  Pulse Rate: (!) 111 [AN]    Clinical Course User Index [AN] Derwood KaplanAnkit Mikal Wisman, MD    Pt is a diabetic female who is obese who comes in with cc of abd pain, flank pain, groin pain. Concerns for renal stones, diverticulitis, pyelonephritis, UTI. Pt is noted to be diabetic. Pt has no uti like symptoms, but she is diabetic. Basic labs and UA ordered. CT abd ordered. Fluids ordered.    Final Clinical Impressions(s) / ED Diagnoses   Final diagnoses:  Tachycardia  Acute abdominal pain in left lower quadrant    New Prescriptions Discharge Medication  List as of 08/10/2016  6:06 PM    START taking these medications   Details  cephALEXin (KEFLEX) 500 MG capsule Take 1 capsule (500 mg total) by mouth 4 (four) times daily., Starting Sun 08/10/2016, Print         Derwood Kaplan, MD 08/11/16 1755

## 2016-08-12 LAB — URINE CULTURE

## 2016-08-19 ENCOUNTER — Observation Stay (HOSPITAL_COMMUNITY)
Admission: EM | Admit: 2016-08-19 | Discharge: 2016-08-21 | Disposition: A | Discharge status: Home / Self Care | Payer: BLUE CROSS/BLUE SHIELD | Attending: Internal Medicine | Admitting: Internal Medicine

## 2016-08-19 ENCOUNTER — Emergency Department (HOSPITAL_COMMUNITY): Payer: BLUE CROSS/BLUE SHIELD

## 2016-08-19 ENCOUNTER — Encounter (HOSPITAL_COMMUNITY): Payer: Self-pay | Admitting: Emergency Medicine

## 2016-08-19 DIAGNOSIS — I25119 Atherosclerotic heart disease of native coronary artery with unspecified angina pectoris: Secondary | ICD-10-CM | POA: Diagnosis not present

## 2016-08-19 DIAGNOSIS — R0602 Shortness of breath: Secondary | ICD-10-CM

## 2016-08-19 DIAGNOSIS — E669 Obesity, unspecified: Secondary | ICD-10-CM | POA: Diagnosis present

## 2016-08-19 DIAGNOSIS — K219 Gastro-esophageal reflux disease without esophagitis: Secondary | ICD-10-CM | POA: Diagnosis not present

## 2016-08-19 DIAGNOSIS — I10 Essential (primary) hypertension: Secondary | ICD-10-CM | POA: Diagnosis not present

## 2016-08-19 DIAGNOSIS — Z6841 Body Mass Index (BMI) 40.0 and over, adult: Secondary | ICD-10-CM | POA: Insufficient documentation

## 2016-08-19 DIAGNOSIS — I2510 Atherosclerotic heart disease of native coronary artery without angina pectoris: Secondary | ICD-10-CM | POA: Diagnosis not present

## 2016-08-19 DIAGNOSIS — Z79899 Other long term (current) drug therapy: Secondary | ICD-10-CM | POA: Diagnosis not present

## 2016-08-19 DIAGNOSIS — J45901 Unspecified asthma with (acute) exacerbation: Secondary | ICD-10-CM | POA: Diagnosis not present

## 2016-08-19 DIAGNOSIS — I5032 Chronic diastolic (congestive) heart failure: Secondary | ICD-10-CM

## 2016-08-19 DIAGNOSIS — J40 Bronchitis, not specified as acute or chronic: Secondary | ICD-10-CM

## 2016-08-19 DIAGNOSIS — Z885 Allergy status to narcotic agent status: Secondary | ICD-10-CM | POA: Diagnosis not present

## 2016-08-19 DIAGNOSIS — Z888 Allergy status to other drugs, medicaments and biological substances status: Secondary | ICD-10-CM | POA: Insufficient documentation

## 2016-08-19 DIAGNOSIS — Z794 Long term (current) use of insulin: Secondary | ICD-10-CM | POA: Insufficient documentation

## 2016-08-19 DIAGNOSIS — E785 Hyperlipidemia, unspecified: Secondary | ICD-10-CM | POA: Diagnosis present

## 2016-08-19 DIAGNOSIS — R079 Chest pain, unspecified: Secondary | ICD-10-CM | POA: Diagnosis not present

## 2016-08-19 DIAGNOSIS — E119 Type 2 diabetes mellitus without complications: Secondary | ICD-10-CM | POA: Diagnosis not present

## 2016-08-19 DIAGNOSIS — Z7982 Long term (current) use of aspirin: Secondary | ICD-10-CM | POA: Diagnosis not present

## 2016-08-19 DIAGNOSIS — J209 Acute bronchitis, unspecified: Secondary | ICD-10-CM | POA: Insufficient documentation

## 2016-08-19 LAB — BASIC METABOLIC PANEL
Anion gap: 8 (ref 5–15)
BUN: 11 mg/dL (ref 6–20)
CO2: 26 mmol/L (ref 22–32)
Calcium: 8.9 mg/dL (ref 8.9–10.3)
Chloride: 102 mmol/L (ref 101–111)
Creatinine, Ser: 0.62 mg/dL (ref 0.44–1.00)
GFR calc Af Amer: >60 mL/min (ref >60)
GFR calc non Af Amer: >60 mL/min (ref >60)
Glucose, Bld: 122 mg/dL — ABNORMAL HIGH (ref 65–99)
Potassium: 4 mmol/L (ref 3.5–5.1)
Sodium: 136 mmol/L (ref 135–145)

## 2016-08-19 LAB — HEPATIC FUNCTION PANEL
ALT: 27 U/L (ref 14–54)
AST: 29 U/L (ref 15–41)
Albumin: 3.5 g/dL (ref 3.5–5.0)
Alkaline Phosphatase: 124 U/L (ref 38–126)
Bilirubin, Direct: <0.1 mg/dL — ABNORMAL LOW (ref 0.1–0.5)
Total Bilirubin: 0.4 mg/dL (ref 0.3–1.2)
Total Protein: 7.5 g/dL (ref 6.5–8.1)

## 2016-08-19 LAB — CBC
HCT: 36.3 % (ref 36.0–46.0)
Hemoglobin: 12.1 g/dL (ref 12.0–15.0)
MCH: 27.5 pg (ref 26.0–34.0)
MCHC: 33.3 g/dL (ref 30.0–36.0)
MCV: 82.5 fL (ref 78.0–100.0)
Platelets: 409 10*3/uL — ABNORMAL HIGH (ref 150–400)
RBC: 4.4 MIL/uL (ref 3.87–5.11)
RDW: 13.3 % (ref 11.5–15.5)
WBC: 14.2 10*3/uL — ABNORMAL HIGH (ref 4.0–10.5)

## 2016-08-19 LAB — I-STAT TROPONIN, ED: Troponin i, poc: 0 ng/mL (ref 0.00–0.08)

## 2016-08-19 LAB — LIPASE, BLOOD: Lipase: 17 U/L (ref 11–51)

## 2016-08-19 LAB — GLUCOSE, CAPILLARY: Glucose-Capillary: 166 mg/dL — ABNORMAL HIGH (ref 65–99)

## 2016-08-19 LAB — TROPONIN I: Troponin I: <0.03 ng/mL (ref <0.03)

## 2016-08-19 LAB — BRAIN NATRIURETIC PEPTIDE: B Natriuretic Peptide: 155.6 pg/mL — ABNORMAL HIGH (ref 0.0–100.0)

## 2016-08-19 MED ORDER — IPRATROPIUM-ALBUTEROL 0.5-2.5 (3) MG/3ML IN SOLN
3.0000 mL | Freq: Three times a day (TID) | RESPIRATORY_TRACT | Status: DC
  Administered 2016-08-19 – 2016-08-21 (×5): 3 mL via RESPIRATORY_TRACT
  Filled 2016-08-19 (×5): qty 3

## 2016-08-19 MED ORDER — IOPAMIDOL (ISOVUE-370) INJECTION 76%
INTRAVENOUS | Status: AC
  Administered 2016-08-19: 100 mL via INTRAVENOUS
  Filled 2016-08-19: qty 100

## 2016-08-19 MED ORDER — ENOXAPARIN SODIUM 40 MG/0.4ML ~~LOC~~ SOLN
40.0000 mg | Freq: Every day | SUBCUTANEOUS | Status: DC
  Administered 2016-08-19 – 2016-08-20 (×2): 40 mg via SUBCUTANEOUS
  Filled 2016-08-19 (×2): qty 0.4

## 2016-08-19 MED ORDER — PANTOPRAZOLE SODIUM 40 MG PO TBEC
40.0000 mg | DELAYED_RELEASE_TABLET | Freq: Every day | ORAL | Status: DC
  Administered 2016-08-19 – 2016-08-20 (×2): 40 mg via ORAL
  Filled 2016-08-19 (×2): qty 1

## 2016-08-19 MED ORDER — ATENOLOL 50 MG PO TABS
50.0000 mg | ORAL_TABLET | Freq: Every day | ORAL | Status: DC
Start: 2016-08-20 — End: 2016-08-21
  Administered 2016-08-20 – 2016-08-21 (×2): 50 mg via ORAL
  Filled 2016-08-19 (×2): qty 1

## 2016-08-19 MED ORDER — ALBUTEROL SULFATE (2.5 MG/3ML) 0.083% IN NEBU
5.0000 mg | INHALATION_SOLUTION | Freq: Once | RESPIRATORY_TRACT | Status: AC
  Administered 2016-08-19: 5 mg via RESPIRATORY_TRACT
  Filled 2016-08-19: qty 6

## 2016-08-19 MED ORDER — LORATADINE 10 MG PO TABS
10.0000 mg | ORAL_TABLET | Freq: Every day | ORAL | Status: DC
  Administered 2016-08-20 – 2016-08-21 (×2): 10 mg via ORAL
  Filled 2016-08-19 (×2): qty 1

## 2016-08-19 MED ORDER — ALBUTEROL (5 MG/ML) CONTINUOUS INHALATION SOLN: 10.0000 mg/h | INHALATION_SOLUTION | Freq: Once | RESPIRATORY_TRACT | Status: DC

## 2016-08-19 MED ORDER — INSULIN ASPART 100 UNIT/ML ~~LOC~~ SOLN
0.0000 [IU] | Freq: Three times a day (TID) | SUBCUTANEOUS | Status: DC
  Administered 2016-08-20: 7 [IU] via SUBCUTANEOUS
  Administered 2016-08-20: 3 [IU] via SUBCUTANEOUS
  Administered 2016-08-20: 4 [IU] via SUBCUTANEOUS
  Administered 2016-08-21: 7 [IU] via SUBCUTANEOUS
  Administered 2016-08-21: 4 [IU] via SUBCUTANEOUS

## 2016-08-19 MED ORDER — IPRATROPIUM-ALBUTEROL 0.5-2.5 (3) MG/3ML IN SOLN: 3.0000 mL | RESPIRATORY_TRACT | Status: DC | PRN

## 2016-08-19 MED ORDER — LOSARTAN POTASSIUM 50 MG PO TABS
25.0000 mg | ORAL_TABLET | Freq: Every day | ORAL | Status: DC
  Administered 2016-08-20 – 2016-08-21 (×2): 25 mg via ORAL
  Filled 2016-08-19 (×2): qty 1

## 2016-08-19 MED ORDER — GUAIFENESIN ER 600 MG PO TB12
600.0000 mg | ORAL_TABLET | Freq: Two times a day (BID) | ORAL | Status: DC
  Administered 2016-08-19 – 2016-08-21 (×4): 600 mg via ORAL
  Filled 2016-08-19 (×4): qty 1

## 2016-08-19 MED ORDER — PREDNISONE 20 MG PO TABS
40.0000 mg | ORAL_TABLET | Freq: Every day | ORAL | Status: DC
  Administered 2016-08-20 – 2016-08-21 (×2): 40 mg via ORAL
  Filled 2016-08-19 (×2): qty 2

## 2016-08-19 MED ORDER — ASPIRIN EC 325 MG PO TBEC
325.0000 mg | DELAYED_RELEASE_TABLET | Freq: Every day | ORAL | Status: DC
  Administered 2016-08-19 – 2016-08-21 (×3): 325 mg via ORAL
  Filled 2016-08-19 (×3): qty 1

## 2016-08-19 MED ORDER — INSULIN ASPART 100 UNIT/ML ~~LOC~~ SOLN: 0.0000 [IU] | Freq: Every day | SUBCUTANEOUS | Status: DC

## 2016-08-19 MED ORDER — METHYLPREDNISOLONE SODIUM SUCC 125 MG IJ SOLR
125.0000 mg | Freq: Once | INTRAMUSCULAR | Status: AC
  Administered 2016-08-19: 125 mg via INTRAVENOUS
  Filled 2016-08-19: qty 2

## 2016-08-19 MED ORDER — ONDANSETRON HCL 4 MG/2ML IJ SOLN: 4.0000 mg | Freq: Four times a day (QID) | INTRAMUSCULAR | Status: DC | PRN

## 2016-08-19 MED ORDER — ACETAMINOPHEN 325 MG PO TABS
650.0000 mg | ORAL_TABLET | ORAL | Status: DC | PRN
  Administered 2016-08-21: 650 mg via ORAL
  Filled 2016-08-19: qty 2

## 2016-08-19 MED ORDER — INSULIN ASPART PROT & ASPART (70-30 MIX) 100 UNIT/ML ~~LOC~~ SUSP
40.0000 [IU] | Freq: Two times a day (BID) | SUBCUTANEOUS | Status: DC
  Administered 2016-08-20 – 2016-08-21 (×4): 40 [IU] via SUBCUTANEOUS
  Filled 2016-08-19 (×2): qty 10

## 2016-08-19 MED ORDER — FUROSEMIDE 10 MG/ML IJ SOLN
40.0000 mg | Freq: Once | INTRAMUSCULAR | Status: AC
  Administered 2016-08-19: 40 mg via INTRAVENOUS
  Filled 2016-08-19: qty 4

## 2016-08-19 NOTE — ED Triage Notes (Signed)
Pt reports she has been unable to catch her breath for the past week. Pt coughing in triage. Also reports diarrhea.

## 2016-08-19 NOTE — H&P (Signed)
History and Physical    ASTER SCREWS ZOX:[id number] DOB: Jul 03, 1960 DOA: 08/19/2016  PCP: Lolita Patella, MD   Patient coming from: PCP's office  Chief Complaint: Chest pain and dyspnea on exertion  HPI: Elizabeth Huber is a 56 y.o. woman with a history of single vessel CAD diagnosed in 2013, HTN, HLD, GERD (plus hiatal hernia, controlled with Prilosec), Type 2 DM (with long term insulin use), and morbid obesity who presented to her PCP's office today for evaluation of chest pain and dyspnea on exertion.  The patient reports progressive symptoms for one week.  She has had cough and chest congestion as well as chest wall soreness.  However, she also describes new dyspnea on exertion, reporting that she could not walk from her car to her doctor's office today without feeling breathless, which is associated with substernal chest pressure.  She has had chills but no fever or sweats.  No light-headedness or LOC.  No new exposures.  No known sick contacts.  She has "chronic" swelling in her legs.  She has noted weight gain in the past week.  No increased abdominal girth.  She has been taking Zyrtec for seasonal allergies.  She has had diarrhea with diffuse abdominal pain, onset of symptoms after meals, for the past several days.  Stools are loose but not completely watery per the patient.  No BRBPR.  ED Course: The patient presented with tachycardia and tachypnea.  Chest xray was suggestive of cardiomegaly with mild vascular congestion.  EKG did not show acute ischemia.  Negative troponin.  Mild elevation of BNP at 155.  CTA chest was ordered which was not conclusive for PE (limited by body habitus); there were no focal consolidations or pleural effusions.    The patient received albuterol nebulizer treatment and solumedrol  IV x one for probable acute bronchitis.  Hospitalist asked to admit.  I am concerned for underlying cardiac disease as well.   Review of Systems: As per HPI otherwise  10 systems reviewed and negative except as stated in the HPI.   Past Medical History:  Diagnosis Date  . Angina   . Asthma   . Chronic bronchitis    "have it whenever I have a cold"  . Diabetes mellitus type 2 in obese (HCC)   . Dysrhythmia 08/03/11   "palpitations"  . GERD (gastroesophageal reflux disease)   . Headache(784.0)   . Herpes simplex    "type 1 & 2"  . High cholesterol   . Hypertension   . Phlebitis   . Shortness of breath on exertion     Past Surgical History:  Procedure Laterality Date  . CORONARY ANGIOGRAM N/A 08/05/2011   Procedure: CORONARY ANGIOGRAM;  Surgeon: Thurmon Fair, MD;  Location: [facility]ATH [facility];  Service: Cardiovascular;  Laterality: N/A;  . SALPINGECTOMY     w/ovary removal; "right"  . SALPINGOOPHORECTOMY    . TOENAIL EXCISION       reports that she has never smoked. She has never used smokeless tobacco. She reports that she does not drink alcohol or use drugs.  She is not married; no children.  Her father is her next of kin.  Allergies  Allergen Reactions  . Hydrocodone Other (See Comments)    Makes heart race  . Oxycodone Anxiety  . Morphine And Related Other (See Comments)    Unknown   . Tradjenta [Linagliptin] Anxiety  . Vicodin [Hydrocodone-Acetaminophen] Palpitations    History reviewed. No pertinent family history.  She denies any known  family history of heart or lung disease.   Prior to Admission medications   Medication Sig Start Date End Date Taking? Authorizing Provider  acetaminophen (TYLENOL) 500 MG tablet Take 1,000 mg by mouth every 6 (six) hours as needed for moderate pain or headache.    Yes Historical Provider, MD  aspirin EC 81 MG tablet Take 162 mg by mouth 2 (two) times daily as needed for mild pain.   Yes Historical Provider, MD  atenolol (TENORMIN) 50 MG tablet Take 50 mg by mouth daily.   Yes Historical Provider, MD  Camphor-Eucalyptus-Menthol (VICKS VAPORUB) 4.7-1.2-2.6 % OINT Apply 1 application topically daily.    Yes Historical Provider, MD  cephALEXin (KEFLEX) 500 MG capsule Take 1 capsule (500 mg total) by mouth 4 (four) times daily. 08/10/16  Yes Derwood Kaplan, MD  guaifenesin (ROBITUSSIN) 100 MG/5ML syrup Take 200 mg by mouth 3 (three) times daily as needed for cough.   Yes Historical Provider, MD  insulin NPH-regular (NOVOLIN 70/30) (70-30) 100 UNIT/ML injection Inject 80 Units into the skin 2 (two) times daily with a meal.    Yes Historical Provider, MD  losartan (COZAAR) 25 MG tablet Take 25 mg by mouth daily.   Yes Historical Provider, MD  Multiple Vitamins-Minerals (CENTRUM PO) Take 1 tablet by mouth daily.    Yes Historical Provider, MD  omeprazole (PRILOSEC) 20 MG capsule Take 20 mg by mouth 2 (two) times daily before a meal.   Yes Historical Provider, MD    Physical Exam: Vitals:   08/19/16 1600 08/19/16 1633 08/19/16 1745 08/19/16 2041  BP: (!) 145/61  127/68 124/72  Pulse:   (!) 105 87  Resp: (!) 23  16 (!) 21  Temp:      TempSrc:      SpO2:  96% 94% 99%  Weight:          Constitutional: NAD, ill appearing with pursed lips with expiration but she is not acutely decompensating Vitals:   08/19/16 1600 08/19/16 1633 08/19/16 1745 08/19/16 2041  BP: (!) 145/61  127/68 124/72  Pulse:   (!) 105 87  Resp: (!) 23  16 (!) 21  Temp:      TempSrc:      SpO2:  96% 94% 99%  Weight:       Eyes: PERRL, lids and conjunctivae normal ENMT: Mucous membranes are moist. Posterior pharynx clear of any exudate or lesions. Normal dentition.  Neck: normal appearance, thick but supple Respiratory: Intermittent expiratory wheezes and ronchi.  No crackles. Mildly tachypnic.  No accessory muscle use.  Cardiovascular: Normal rate, regular rhythm, no murmurs / rubs / gallops. 1+ pretibial edema bilaterally.  2+ pedal pulses. GI: abdomen is super obese and protuberant.  No tenderness.  No abdominal wall pitting edema.  Bowel sounds are present. Musculoskeletal:  No joint deformity in upper and lower  extremities. Good ROM, no contractures. Normal muscle tone.  Skin: no rashes, warm and dry Neurologic: Sensation intact, Strength symmetric bilaterally, 5/5. Psychiatric: Normal judgment and insight. Alert and oriented x 3. Normal mood.     Labs on Admission: I have personally reviewed following labs and imaging studies  CBC:  Recent Labs Lab 08/19/16 1529  WBC 14.2*  HGB 12.1  HCT 36.3  MCV 82.5  PLT 409*   Basic Metabolic Panel:  Recent Labs Lab 08/19/16 1529  NA 136  K 4.0  CL 102  CO2 26  GLUCOSE 122*  BUN 11  CREATININE 0.62  CALCIUM 8.9  GFR: CrCl cannot be calculated (Unknown ideal weight.).  Cardiac Enzymes: Troponin 0  BNP (last 3 results) BNP 155  Radiological Exams on Admission: Dg Chest 2 View  Result Date: 08/19/2016 CLINICAL DATA:  Shortness of breath. EXAM: CHEST  2 VIEW COMPARISON:  08/10/2016 .  03/10/2015. FINDINGS: Mediastinum and hilar structures normal. Cardiomegaly with mild pulmonary venous congestion. Very mild interstitial prominence noted. No pleural effusion or pneumothorax. No acute bony abnormality . IMPRESSION: Cardiomegaly with mild pulmonary venous congestion. Very mild pulmonary interstitial prominence. Mild interstitial edema cannot be excluded. Electronically Signed   By: Maisie Fus  Register   On: 08/19/2016 15:33   Ct Angio Chest Pe W Or Wo Contrast  Result Date: 08/19/2016 CLINICAL DATA:  Shortness of breath cough EXAM: CT ANGIOGRAPHY CHEST WITH CONTRAST TECHNIQUE: Multidetector CT imaging of the chest was performed using the standard protocol during bolus administration of intravenous contrast. Multiplanar CT image reconstructions and MIPs were obtained to evaluate the vascular anatomy. CONTRAST:  100 mL Isovue 370 intravenous COMPARISON:  Chest x-ray 08/19/2016, CT 03/11/2015 FINDINGS: Cardiovascular: Suboptimal opacification of the pulmonary arteries. Examination also limited by large body habitus with resultant grainy images. No  evidence of central pulmonary embolism. Non aneurysmal aorta. No obvious dissection. Mild atherosclerotic calcification. Coronary artery calcification. Mild cardiomegaly. No large pericardial effusion. Mediastinum/Nodes: Midline trachea. Thyroid within normal limits. No grossly enlarged mediastinal lymph nodes. Esophagus within normal limits. Lungs/Pleura: Lungs are clear. No pleural effusion or pneumothorax. Upper Abdomen: Spleen slightly enlarged at 14.5 cm. No acute abnormality. Musculoskeletal: Degenerative changes. No acute or suspicious bone lesion. Review of the MIP images confirms the above findings. IMPRESSION: 1. The examination is limited by habitus and sub optimal opacification of the pulmonary arterial system. No gross acute pulmonary embolus or aortic dissection is seen. 2. Mild cardiomegaly 3. No focal consolidations or pleural effusions. 4. Slightly enlarged spleen Electronically Signed   By: Jasmine Pang M.D.   On: 08/19/2016 18:51    EKG: Independently reviewed by me.  Low voltage, poor R wave progression in anterior leads but no acute ST segment changes.  NSR.    Assessment/Plan Principal Problem:   Chest pain Active Problems:   Diabetes mellitus (HCC)   Hyperlipidemia,    Obesity   HTN (hypertension)   CAD (coronary artery disease) 40% LAD afte Dx1 08/05/11   Dyspnea on exertion   Acute bronchitis   GERD (gastroesophageal reflux disease)      Chest pain and dyspnea on exertion concerning for ACS vs Acute CHF.  PE unlikely based on available imaging. --Telemetry monitoring --Serial troponin --Full strength aspirin (EC) now and daily until work-up complete --Echo in the AM --Analgesics as needed --Already on BB and ARB at baseline --NPO after breakfast in case she needs further cardiac testing.  Low threshold for cardiology consultation knowing that she had nonobstructing disease in her LAD five years ago. --Trial of IV lasix  one time now --Strict I/O --Daily  weights  Cough, wheezing, shortness of breath attributed to acute bronchitis --Reduce steroid dosing to prednisone  po daily for now --Mucinex  BID --Duonebs TID and q4h prn --Incentive spirometry --Continue anti-histamine (formulary substitute) --Supplement oxygen; wean as tolerated  History of GERD --PPI  DM --Half dose of NPH 70/30 BID with meals in the hospital --SSI coverage AC/HS   DVT prophylaxis: Lovenox Code Status: FULL Family Communication: Patient alone in the ED at time of admission. Disposition Plan: Expect she will go home when ready for discharge. Consults called:  NONE Admission status: Place in observation with telemetry monitoring.   TIME SPENT: 70 minutes   Jerene Bears MD [facility] Pager [phone]  If 7PM-7AM, please contact night-coverage [email or web address] Password [email or web address]  08/19/2016, 9:21 PM

## 2016-08-19 NOTE — ED Notes (Signed)
Patient transported to CT 

## 2016-08-19 NOTE — ED Provider Notes (Signed)
[facility]-EMERGENCY DEPT Provider Note   CSN: [id number] Arrival date & time: 08/19/16  1505     History   Chief Complaint Chief Complaint  Patient presents with  . Shortness of Breath  . Cough  . Diarrhea    HPI KASIYA BURCK is a 56 y.o. female.  56 y/o female with h/o morbid obesity and asthma presents with 2 days of dyspnea Sx onset was sudden and a/w cough and congestion No fever or chills  No emesis or diarrhea Sharp chest pain w/ cough and no associated fever Increased orthopnea and doe Went to her pcp today for blood work and sent her for eval of breathing Denies h/o chf      Past Medical History:  Diagnosis Date  . Angina   . Asthma   . Chronic bronchitis    "have it whenever I have a cold"  . Diabetes mellitus type 2 in obese (HCC)   . Dysrhythmia 08/03/11   "palpitations"  . GERD (gastroesophageal reflux disease)   . Headache(784.0)   . Herpes simplex    "type 1 & 2"  . High cholesterol   . Hypertension   . Phlebitis   . Shortness of breath on exertion     Patient Active Problem List   Diagnosis Date Noted  . CAD (coronary artery disease) 40% LAD afte Dx1 08/05/11 08/06/2011  . PSVT (paroxysmal supraventricular tachycardia) (HCC) 08/06/2011  . Chest pain 08/04/2011  . Diabetes mellitus (HCC) 08/04/2011  . Hyperlipidemia,  08/04/2011  . Tachycardia, sinus tach on admission 08/04/2011  . Obesity 08/04/2011  . HTN (hypertension) 08/04/2011    Past Surgical History:  Procedure Laterality Date  . CORONARY ANGIOGRAM N/A 08/05/2011   Procedure: CORONARY ANGIOGRAM;  Surgeon: Thurmon Fair, MD;  Location: [facility] CATH [facility];  Service: Cardiovascular;  Laterality: N/A;  . SALPINGECTOMY     w/ovary removal; "right"  . SALPINGOOPHORECTOMY    . TOENAIL EXCISION      OB History    No data available       Home Medications    Prior to Admission medications   Medication Sig Start Date End Date Taking? Authorizing Provider  acetaminophen (TYLENOL) 500 MG  tablet Take 1,000 mg by mouth every 6 (six) hours as needed for moderate pain or headache.     Historical Provider, MD  aspirin EC 81 MG tablet Take 162 mg by mouth 2 (two) times daily as needed for mild pain.    Historical Provider, MD  atenolol (TENORMIN) 50 MG tablet Take 50 mg by mouth daily.    Historical Provider, MD  Camphor-Eucalyptus-Menthol (VICKS VAPORUB) 4.7-1.2-2.6 % OINT Apply 1 application topically daily.    Historical Provider, MD  cephALEXin (KEFLEX) 500 MG capsule Take 1 capsule (500 mg total) by mouth 4 (four) times daily. 08/10/16   Derwood Kaplan, MD  ciprofloxacin (CIPRO) 500 MG tablet Take 500 mg by mouth 2 (two) times daily. 08/04/16   Historical Provider, MD  guaifenesin (ROBITUSSIN) 100 MG/5ML syrup Take 200 mg by mouth 3 (three) times daily as needed for cough.    Historical Provider, MD  insulin NPH-regular (NOVOLIN 70/30) (70-30) 100 UNIT/ML injection Inject 80 Units into the skin 2 (two) times daily with a meal.     Historical Provider, MD  losartan (COZAAR) 25 MG tablet Take 25 mg by mouth daily.    Historical Provider, MD  Multiple Vitamins-Minerals (CENTRUM PO) Take 1 tablet by mouth daily.     Historical Provider, MD  omeprazole (  PRILOSEC) 20 MG capsule Take 20 mg by mouth 2 (two) times daily before a meal.    Historical Provider, MD  polyethylene glycol (MIRALAX / GLYCOLAX) packet Take 17 g by mouth daily. 06/28/16   Mercedes Street, PA-C    Family History History reviewed. No pertinent family history.  Social History Social History  Substance Use Topics  . Smoking status: Never Smoker  . Smokeless tobacco: Never Used  . Alcohol use No     Allergies   Hydrocodone; Oxycodone; Morphine and related; Tradjenta [linagliptin]; and Vicodin [hydrocodone-acetaminophen]   Review of Systems Review of Systems  All other systems reviewed and are negative.    Physical Exam Updated Vital Signs BP (!) 145/61   Pulse 87   Temp 98.2 F (36.8 C) (Oral)   Resp  (!) 23   Wt (!) 170.1 kg   LMP 05/05/2011   SpO2 91%   BMI 64.37 kg/m   Physical Exam  Constitutional: She is oriented to person, place, and time. She appears well-developed and well-nourished.  Non-toxic appearance. No distress.  HENT:  Head: Normocephalic and atraumatic.  Eyes: Conjunctivae, EOM and lids are normal. Pupils are equal, round, and reactive to light.  Neck: Normal range of motion. Neck supple. No tracheal deviation present. No thyroid mass present.  Cardiovascular: Regular rhythm and normal heart sounds.  Tachycardia present.  Exam reveals no gallop.   No murmur heard. Pulmonary/Chest: No stridor. Tachypnea noted. She is in respiratory distress. She has decreased breath sounds in the right lower field and the left lower field. She has wheezes in the right lower field and the left lower field. She has no rhonchi. She has no rales.  Abdominal: Soft. Normal appearance and bowel sounds are normal. She exhibits no distension. There is no tenderness. There is no rebound and no CVA tenderness.  Musculoskeletal: Normal range of motion. She exhibits no edema or tenderness.  Bilateral 3 plus pedal edema  Neurological: She is alert and oriented to person, place, and time. She has normal strength. No cranial nerve deficit or sensory deficit. GCS eye subscore is 4. GCS verbal subscore is 5. GCS motor subscore is 6.  Skin: Skin is warm and dry. No abrasion and no rash noted.  Psychiatric: She has a normal mood and affect. Her speech is normal and behavior is normal.  Nursing note and vitals reviewed.    ED Treatments / Results  Labs (all labs ordered are listed, but only abnormal results are displayed) Labs Reviewed  BASIC METABOLIC PANEL - Abnormal; Notable for the following:       Result Value   Glucose, Bld 122 (*)    All other components within normal limits  CBC - Abnormal; Notable for the following:    WBC 14.2 (*)    Platelets 409 (*)    All other components within normal  limits  BRAIN NATRIURETIC PEPTIDE  I-STAT TROPOININ, ED    EKG  EKG Interpretation  Date/Time:  Tuesday August 19 2016 15:19:15 EDT Ventricular Rate:  85 PR Interval:    QRS Duration: 112 QT Interval:  385 QTC Calculation: 458 R Axis:   63 Text Interpretation:  Sinus rhythm Short PR interval Low voltage, extremity and precordial leads Consider anterior infarct Confirmed by Freida Busman  MD, Mozes Sagar ([id number]) on 08/19/2016 8:35:15 PM       Radiology Dg Chest 2 View  Result Date: 08/19/2016 CLINICAL DATA:  Shortness of breath. EXAM: CHEST  2 VIEW COMPARISON:  08/10/2016 .  03/10/2015.  FINDINGS: Mediastinum and hilar structures normal. Cardiomegaly with mild pulmonary venous congestion. Very mild interstitial prominence noted. No pleural effusion or pneumothorax. No acute bony abnormality . IMPRESSION: Cardiomegaly with mild pulmonary venous congestion. Very mild pulmonary interstitial prominence. Mild interstitial edema cannot be excluded. Electronically Signed   By: Maisie Fus  Register   On: 08/19/2016 15:33    Procedures Procedures (including critical care time)  Medications Ordered in ED Medications  albuterol (PROVENTIL) (2.5 MG/3ML) 0.083% nebulizer solution 5 mg (not administered)  albuterol (PROVENTIL) (2.5 MG/3ML) 0.083% nebulizer solution 5 mg (not administered)     Initial Impression / Assessment and Plan / ED Course  I have reviewed the triage vital signs and the nursing notes.  Pertinent labs & imaging results that were available during my care of the patient were reviewed by me and considered in my medical decision making (see chart for details).     Patient given Solu-Medrol as well as albuterol for bronchospasm. Patient with tachypnea and had a chest CT which was negative for PE. Will admit to the hospitalist service  Final Clinical Impressions(s) / ED Diagnoses   Final diagnoses:  None    New Prescriptions New Prescriptions   No medications on file     Lorre Nick, MD 08/19/16 2035

## 2016-08-19 NOTE — ED Triage Notes (Signed)
Per PCP, states she needs a pulmonary workup-states fever and "can't catch her breath"

## 2016-08-19 NOTE — ED Triage Notes (Signed)
Coming from PCP, states fever and "cant catch her breath"-sending here for lung problems

## 2016-08-20 ENCOUNTER — Observation Stay (HOSPITAL_BASED_OUTPATIENT_CLINIC_OR_DEPARTMENT_OTHER): Payer: BLUE CROSS/BLUE SHIELD

## 2016-08-20 ENCOUNTER — Encounter (HOSPITAL_COMMUNITY): Payer: Self-pay | Admitting: Cardiology

## 2016-08-20 DIAGNOSIS — I2510 Atherosclerotic heart disease of native coronary artery without angina pectoris: Secondary | ICD-10-CM | POA: Diagnosis not present

## 2016-08-20 DIAGNOSIS — R0609 Other forms of dyspnea: Secondary | ICD-10-CM | POA: Diagnosis not present

## 2016-08-20 DIAGNOSIS — R071 Chest pain on breathing: Secondary | ICD-10-CM | POA: Diagnosis not present

## 2016-08-20 DIAGNOSIS — E119 Type 2 diabetes mellitus without complications: Secondary | ICD-10-CM

## 2016-08-20 DIAGNOSIS — J45901 Unspecified asthma with (acute) exacerbation: Secondary | ICD-10-CM

## 2016-08-20 DIAGNOSIS — R0600 Dyspnea, unspecified: Secondary | ICD-10-CM | POA: Diagnosis not present

## 2016-08-20 DIAGNOSIS — J209 Acute bronchitis, unspecified: Secondary | ICD-10-CM

## 2016-08-20 DIAGNOSIS — E6609 Other obesity due to excess calories: Secondary | ICD-10-CM

## 2016-08-20 DIAGNOSIS — Z6841 Body Mass Index (BMI) 40.0 and over, adult: Secondary | ICD-10-CM

## 2016-08-20 DIAGNOSIS — Z794 Long term (current) use of insulin: Secondary | ICD-10-CM | POA: Diagnosis not present

## 2016-08-20 DIAGNOSIS — I10 Essential (primary) hypertension: Secondary | ICD-10-CM | POA: Diagnosis not present

## 2016-08-20 DIAGNOSIS — K219 Gastro-esophageal reflux disease without esophagitis: Secondary | ICD-10-CM | POA: Diagnosis not present

## 2016-08-20 LAB — BASIC METABOLIC PANEL
Anion gap: 9 (ref 5–15)
BUN: 13 mg/dL (ref 6–20)
CO2: 29 mmol/L (ref 22–32)
Calcium: 9.1 mg/dL (ref 8.9–10.3)
Chloride: 100 mmol/L — ABNORMAL LOW (ref 101–111)
Creatinine, Ser: 0.73 mg/dL (ref 0.44–1.00)
GFR calc Af Amer: >60 mL/min (ref >60)
GFR calc non Af Amer: >60 mL/min (ref >60)
Glucose, Bld: 273 mg/dL — ABNORMAL HIGH (ref 65–99)
Potassium: 4.4 mmol/L (ref 3.5–5.1)
Sodium: 138 mmol/L (ref 135–145)

## 2016-08-20 LAB — TROPONIN I
Troponin I: <0.03 ng/mL (ref <0.03)
Troponin I: <0.03 ng/mL (ref <0.03)

## 2016-08-20 LAB — ECHOCARDIOGRAM COMPLETE
Height: 64 in
Weight: 5950.4 oz

## 2016-08-20 LAB — GLUCOSE, CAPILLARY
Glucose-Capillary: 230 mg/dL — ABNORMAL HIGH (ref 65–99)
Glucose-Capillary: 175 mg/dL — ABNORMAL HIGH (ref 65–99)
Glucose-Capillary: 197 mg/dL — ABNORMAL HIGH (ref 65–99)
Glucose-Capillary: 187 mg/dL — ABNORMAL HIGH (ref 65–99)

## 2016-08-20 MED ORDER — PANTOPRAZOLE SODIUM 40 MG PO TBEC
40.0000 mg | DELAYED_RELEASE_TABLET | Freq: Two times a day (BID) | ORAL | Status: DC
  Administered 2016-08-21: 40 mg via ORAL
  Filled 2016-08-20: qty 1

## 2016-08-20 MED ORDER — FUROSEMIDE 40 MG PO TABS
40.0000 mg | ORAL_TABLET | Freq: Every day | ORAL | Status: DC
  Administered 2016-08-21: 40 mg via ORAL
  Filled 2016-08-20: qty 1

## 2016-08-20 MED ORDER — PERFLUTREN LIPID MICROSPHERE
1.0000 mL | INTRAVENOUS | Status: AC | PRN
  Administered 2016-08-20: 4 mL via INTRAVENOUS

## 2016-08-20 MED ORDER — PERFLUTREN LIPID MICROSPHERE
INTRAVENOUS | Status: AC
  Filled 2016-08-20: qty 10

## 2016-08-20 NOTE — Consult Note (Signed)
Cardiology Consult    Patient ID: Elizabeth Huber MRN: [id number], DOB/AGE: 06/24/1960   Admit date: 08/19/2016 Date of Consult: 08/20/2016  Primary Physician: Lolita Patella, MD Reason for Consult: Chest pain Primary Cardiologist: New- Dr. Anne Fu Requesting Provider: Dr. Gwenlyn Perking  History of Present Illness    Elizabeth Huber is a 56 y.o. female who is being seen today for the evaluation of chest pain at the request of Dr. Gwenlyn Perking. She has prior medical history of minor atherosclerosis (40%) of the mid LAD 2013, HTN, HLD, GERD (plus hiatal hernai controlled on Prilosec), chronic bronchtits, Type 2 DM with long term use of insulin, and morbid obesity with Body mass index is 63.84 kg/m. She presented to the [facility] ED on 08/19/16 for the evaluation of chest pain and dyspnea on exertion.   The patient presented with tachycardia and tachypnea.  Chest xray was suggestive of cardiomegaly with mild vascular congestion.  EKG did not show acute ischemia.  Negative troponin.  Mild elevation of BNP at 155.  CTA chest was ordered which was not conclusive for PE (limited by body habitus); there were no focal consolidations or pleural effusions.   She was seen in the ED for abdominal pain 08/10/16 and started on cephalexin after which she developed diarrhea that she had for several days. Last loose stool yesterday. She was working and not having any shortness of breath or chest pain until 2 days ago when she began with DOE and wheezing which progressed until yesterday when she could not walk throught her house without significant shortness of breath. She has mild chest tightness with the difficulty breathing. She had no exertional symptoms prior. She does have lower extremity edema that she was put on lasix to treat however she is unable to take lasix while she works and has not been taking it all. She has 1-2+ lower leg edema. She has not orthopnea or PND.  She was seen in 07/2011 for atypical chest pain  and palpitations. A cardiac cath was done finding only minor coronary atherosclerosis (40% stenosis) in the mid LAD and aggressive risk factor modification was recommended.   Troponins: 0.00, <0.03, <0.03 BNP 155.6 TSH 0.728 SCr 0.62,  K+ 4.0 CXR: Cardiomegaly with mild pulmonary venous congestion. Very mild pulmonary interstitial prominence. Mild interstitial edema cannot be excluded. CT chest: The examination is limited by habitus and sub optimal opacification of the pulmonary arterial system. No gross acute pulmonary embolus or aortic dissection is seen. Mild cardiomegaly No focal consolidations or pleural effusions. Slightly enlarged spleen  Past Medical History   Past Medical History:  Diagnosis Date  . Angina   . Asthma   . Chronic bronchitis    "have it whenever I have a cold"  . Diabetes mellitus type 2 in obese (HCC)   . Dysrhythmia 08/03/11   "palpitations"  . GERD (gastroesophageal reflux disease)   . Headache(784.0)   . Herpes simplex    "type 1 & 2"  . High cholesterol   . Hypertension   . Phlebitis   . Shortness of breath on exertion     Past Surgical History:  Procedure Laterality Date  . CORONARY ANGIOGRAM N/A 08/05/2011   Procedure: CORONARY ANGIOGRAM;  Surgeon: Thurmon Fair, MD;  Location: [facility] CATH LAB;  Service: Cardiovascular;  Laterality: N/A;  . SALPINGECTOMY     w/ovary removal; "right"  . SALPINGOOPHORECTOMY    . TOENAIL EXCISION       Allergies  Allergies  Allergen Reactions  .  Hydrocodone Other (See Comments)    Makes heart race  . Oxycodone Anxiety  . Morphine And Related Other (See Comments)    Unknown   . Tradjenta [Linagliptin] Anxiety  . Vicodin [Hydrocodone-Acetaminophen] Palpitations    Inpatient Medications    . aspirin EC  325 mg Oral Daily  . atenolol  50 mg Oral Daily  . enoxaparin (LOVENOX) injection  40 mg Subcutaneous QHS  . guaiFENesin  600 mg Oral BID  . insulin aspart  0-20 Units Subcutaneous TID WC  . insulin  aspart  0-5 Units Subcutaneous QHS  . insulin aspart protamine- aspart  40 Units Subcutaneous BID WC  . ipratropium-albuterol  3 mL Nebulization TID  . loratadine  10 mg Oral Daily  . losartan  25 mg Oral Daily  . pantoprazole  40 mg Oral Daily  . predniSONE  40 mg Oral Q breakfast    Family History    Family History  Problem Relation Age of Onset  . Diabetes Mother   . Hypertension Mother   . CAD Father   . Diabetes Sister   . Healthy Brother      Social History    Social History   Social History  . Marital status: Single    Spouse name: N/A  . Number of children: N/A  . Years of education: N/A   Occupational History  . Not on file.   Social History Main Topics  . Smoking status: Never Smoker  . Smokeless tobacco: Never Used  . Alcohol use No  . Drug use: No  . Sexual activity: Not Currently   Other Topics Concern  . Not on file   Social History Narrative  . No narrative on file     Review of Systems    General:  No chills, fever, night sweats or weight changes.  Cardiovascular:  Mild chest tightness with difficulty breathing, dyspnea on exertion, edema, No orthopnea, palpitations, paroxysmal nocturnal dyspnea. Dermatological: No rash, lesions/masses Respiratory: Positive for occ cough, nasal congestion, wheezing Urologic: No hematuria, dysuria Abdominal:   No nausea, vomiting, diarrhea, bright red blood per rectum, melena, or hematemesis Neurologic:  No visual changes, wkns, changes in mental status. All other systems reviewed and are otherwise negative except as noted above.  Physical Exam    Blood pressure (!) 121/53, pulse 77, temperature 98.1 F (36.7 C), temperature source Oral, resp. rate 20, height  (1.626 m), weight (!) 371 lb 14.4 oz (168.7 kg), last menstrual period 05/05/2011, SpO2 97 %.  General: Pleasant, morbidly obese female, NAD Psych: Normal affect. Neuro: Alert and oriented X 3. Moves all extremities spontaneously. HEENT:  Normal  Neck: Supple without bruits or JVD. Lungs:  Resp regular and unlabored, expiratory wheezes throughout Heart: RRR no s3, s4, or murmurs. Abdomen: Soft, non-tender, non-distended, BS + x 4.  Extremities: No clubbing, cyanosis. 1-2+ lower extremity edema.  DP/PT/Radials 2+ and equal bilaterally.  Labs    Troponin [facility] Test)  Recent Labs  08/19/16 1537  TROPIPOC 0.00    Recent Labs  08/19/16 2128 08/20/16 0021 08/20/16 0527  TROPONINI <0.03 <0.03 <0.03   Lab Results  Component Value Date   WBC 14.2 (H) 08/19/2016   HGB 12.1 08/19/2016   HCT 36.3 08/19/2016   MCV 82.5 08/19/2016   PLT 409 (H) 08/19/2016     Recent Labs Lab 08/19/16 1541 08/20/16 0527  NA  --  138  K  --  4.4  CL  --  100*  CO2  --  29  BUN  --  13  CREATININE  --  0.73  CALCIUM  --  9.1  PROT 7.5  --   BILITOT 0.4  --   ALKPHOS 124  --   ALT 27  --   AST 29  --   GLUCOSE  --  273*   Lab Results  Component Value Date   CHOL 180 08/06/2011   HDL 44 08/06/2011   LDLCALC 97 08/06/2011   TRIG 195 (H) 08/06/2011   Lab Results  Component Value Date   DDIMER <0.22 08/04/2011     Radiology Studies    Dg Chest 2 View  Result Date: 08/19/2016 CLINICAL DATA:  Shortness of breath. EXAM: CHEST  2 VIEW COMPARISON:  08/10/2016 .  03/10/2015. FINDINGS: Mediastinum and hilar structures normal. Cardiomegaly with mild pulmonary venous congestion. Very mild interstitial prominence noted. No pleural effusion or pneumothorax. No acute bony abnormality . IMPRESSION: Cardiomegaly with mild pulmonary venous congestion. Very mild pulmonary interstitial prominence. Mild interstitial edema cannot be excluded. Electronically Signed   By: Maisie Fus  Register   On: 08/19/2016 15:33   Dg Chest 2 View  Result Date: 08/10/2016 CLINICAL DATA:  Hypoxia EXAM: CHEST  2 VIEW COMPARISON:  03/11/2015 FINDINGS: Cardiomediastinal silhouette is stable. Cardiomediastinal silhouette is unremarkable. No infiltrate or  pleural effusion. No pulmonary edema. Mild degenerative changes lower thoracic spine. IMPRESSION: No active cardiopulmonary disease. Mild degenerative changes lower thoracic spine. Electronically Signed   By: Natasha Mead M.D.   On: 08/10/2016 16:52   Ct Angio Chest Pe W Or Wo Contrast  Result Date: 08/19/2016 CLINICAL DATA:  Shortness of breath cough EXAM: CT ANGIOGRAPHY CHEST WITH CONTRAST TECHNIQUE: Multidetector CT imaging of the chest was performed using the standard protocol during bolus administration of intravenous contrast. Multiplanar CT image reconstructions and MIPs were obtained to evaluate the vascular anatomy. CONTRAST:  100 mL Isovue 370 intravenous COMPARISON:  Chest x-ray 08/19/2016, CT 03/11/2015 FINDINGS: Cardiovascular: Suboptimal opacification of the pulmonary arteries. Examination also limited by large body habitus with resultant grainy images. No evidence of central pulmonary embolism. Non aneurysmal aorta. No obvious dissection. Mild atherosclerotic calcification. Coronary artery calcification. Mild cardiomegaly. No large pericardial effusion. Mediastinum/Nodes: Midline trachea. Thyroid within normal limits. No grossly enlarged mediastinal lymph nodes. Esophagus within normal limits. Lungs/Pleura: Lungs are clear. No pleural effusion or pneumothorax. Upper Abdomen: Spleen slightly enlarged at 14.5 cm. No acute abnormality. Musculoskeletal: Degenerative changes. No acute or suspicious bone lesion. Review of the MIP images confirms the above findings. IMPRESSION: 1. The examination is limited by habitus and sub optimal opacification of the pulmonary arterial system. No gross acute pulmonary embolus or aortic dissection is seen. 2. Mild cardiomegaly 3. No focal consolidations or pleural effusions. 4. Slightly enlarged spleen Electronically Signed   By: Jasmine Pang M.D.   On: 08/19/2016 18:51   Ct Abdomen Pelvis W Contrast  Result Date: 08/10/2016 CLINICAL DATA:  Progressive RT sided  abdominal pain today with nausea. EXAM: CT ABDOMEN AND PELVIS WITH CONTRAST TECHNIQUE: Multidetector CT imaging of the abdomen and pelvis was performed using the standard protocol following bolus administration of intravenous contrast. CONTRAST:  1 ISOVUE-300 IOPAMIDOL (ISOVUE-300) INJECTION 61% COMPARISON:  06/28/2016 FINDINGS: Lower chest: Lung bases are clear. Heart size is normal. No imaged pericardial effusion or significant coronary artery calcifications. Study quality is degraded by patient motion artifact. Hepatobiliary: No focal liver abnormality is seen. No gallstones, gallbladder wall thickening, or biliary dilatation. Pancreas: Unremarkable. No pancreatic ductal  dilatation or surrounding inflammatory changes. Spleen: Normal in size without focal abnormality. Adrenals/Urinary Tract: Adrenal glands are unremarkable. Kidneys are normal, without renal calculi, focal lesion, or hydronephrosis. Bladder is unremarkable. Stomach/Bowel: The stomach and small bowel loops are normal in appearance. Colon is normal in appearance. Average stool burden. The appendix is well seen and has a normal appearance. Vascular/Lymphatic: No significant vascular findings are present. No enlarged abdominal or pelvic lymph nodes. Reproductive: Uterus is present and is anteflexed. Left ovary has a normal appearance. Status post right salpingo-oophorectomy. Other: No abdominal wall hernia or abnormality. No abdominopelvic ascites. Musculoskeletal: There is facet hypertrophy and degenerative change in the lower lumbar spine. Bilateral SI joint sclerosis. No suspicious lytic or blastic lesions are identified. IMPRESSION: 1.  No evidence for acute  abnormality. 2. Normal appendix. 3. Average stool burden. 4. Status post right salpingo-oophorectomy. 5. Degenerative changes in the lower lumbar spine and SI joints. Electronically Signed   By: Norva Pavlov M.D.   On: 08/10/2016 16:21    EKG & Cardiac Imaging   EKG: NSR 85 bpm, late  r wave progression, no change from 2016  Echocardiogram: Pending  Assessment & Plan    Chest pain -Hx non-obstructive coronary atherosclerosis of the mid LAD in 2013. Has 2 days of DOE, wheezing, and mild chest tightness assoiciated with the dyspnea. No prior exertional dyspnea or chest discomfort.  -Negative troponin X 3, no acute ischemic changes on EKG -Current therapy included aspirin 81 mg, atenolol 50 mg, losartan 25 mg, continue -She has lower extremity edema for while she is prescribed lasix, but is unable to take it while she is working.  -Echo is pending -Trial of IV lasix  given X 1. Pt reports increased UOP, not all was caught and recorded.  -Her symptoms are more consistent with respiratory illness and I do not feel that this is ACS.  -Advise continued gentle diuresis. No further cardiac workup. Aggressive risk factor modification including weight loss, heart healthy low sodium diet.  Acute bronchitis -IM managing with steroids, mucinex, duonebs  HTN  -BP well controlled on atenolol and losartan  HLD -No lipid panel in EPIC since 2013. LDL was 116, Not on statin due to leg cramps with atorvastatin. She is willing to rechallenge at a low dose. Consider low dose Crestor.  History of GERD -PPI  DM type 2 on insulin long term -Managed pre IM  Morbid Obesity -BMI 63.84 -Advise heart healthy diet with lots of fresh fruits and vegetable, water intake, decrease processed and calorie dense foods.    Oletta Cohn, NP-C 08/20/2016, 9:48 AM Pager: [phone]  Personally seen and examined. Agree with above.  56 year old female with super morbid obesity, wheezing, acute bronchitis and atypical chest pain area physical exam reveals a morbidly obese female, chronic lower extremity edema, lungs with bilateral mild wheezing heard, minimally increased respiratory effort with movement. Heart regular rate and rhythm, distant heart sounds.  Chest pain atypical  -  Prior cardiac catheterization demonstrated nonobstructive disease.  - Troponins have been normal, EKG shows no evidence of ischemia.  - Continue to have a cane preventive efforts, weight loss is of utmost importance.  - No further cardiac testing.  Super morbid obesity  - It would be optimal for her to have a 200 pound weight loss.  - Continue to encourage decreased caloric intake.  - This will help with her diabetes, shortness of breath.  Bronchitis  - Wheezing heard on exam.  - Per primary team.  -  Could also continue with gentle diuresis as she is definitely going to have some fluid overload given her body weight.  Please let us know if we can be of further assistance. We will sign off. We'll follow-up with echocardiogram.  Donato Schultz, MD

## 2016-08-20 NOTE — Progress Notes (Signed)
  Echocardiogram 2D Echocardiogram with Definity has been performed.  Leta Jungling M 08/20/2016, 1:40 PM

## 2016-08-20 NOTE — Progress Notes (Signed)
[facility] PROGRESS NOTE  Elizabeth Huber ZOX:[id number] DOB: Jul 21, 1960 DOA: 08/19/2016 PCP: Lolita Patella, MD Interim summary and HPI 56 y.o. woman with a history of single vessel CAD diagnosed in 2013, HTN, HLD, GERD (plus hiatal hernia, controlled with Prilosec), Type 2 DM (with long term insulin use), and morbid obesity who presented to her PCP's office today for evaluation of chest pain and dyspnea on exertion.  The patient reports progressive symptoms for one week.  She has had cough and chest congestion as well as chest wall soreness.  However, she also describes new dyspnea on exertion, reporting that she could not walk from her car to her doctor's office today without feeling breathless, which is associated with substernal chest pressure.  She has had chills but no fever or sweats.  No light-headedness or LOC.  Assessment/Plan: 1-SOB: per patient with DOE -most likely secondary to OSA/OHS -there is concerns for also pulmonary HTN and diastolic HF component; even not seen on echo. -received IV lasix, with increase in urine output -per discussion with cardiology will add lasix daily -no further work indicated as inpatient -needs to drastically lose weight -patient encouraged to follow low sodium diet   2-chest pain: -neg troponin -no ischemic changes on EKG or telemetry -echo reassuring  -will continue risk factors modification -adjust PPI dosage for better GERD protection   3-obesity  -needs aggressive weight loss -will benefit of bariatric evaluation -low calorie diet and exercise discussed with patient  -Body mass index is 63.84 kg/m.   4-HTN -will continue atenolol and cozaar  5-allergic rhinitis and asthma exacerbation -will continue steroids tapering  -continue claritin and mucinex -will also continue duoneb  6-diabetes will continue SSI and 70/30  Code Status: Full Family Communication: no family at bedside Disposition Plan: will start PO lasix;  discharge in am. Per discussion with cardiology no further inpatient work up needed. Kept overnight to follow on echo results.   Consultants:  Cardiology   Procedures:  2-D echo - Left ventricle: The cavity size was normal. There was mild focal   basal hypertrophy of the septum. Systolic function was normal.   The estimated ejection fraction was in the range of 60% to 65%.   Wall motion was normal; there were no regional wall motion   abnormalities. Left ventricular diastolic function parameters   were normal. - Aortic valve: Trileaflet; normal thickness leaflets. There was no   regurgitation. - Aortic root: The aortic root was normal in size. - Mitral valve: Structurally normal valve. There was no   regurgitation. - Right ventricle: The cavity size was normal. Wall thickness was   normal. Systolic function was normal. - Right atrium: The atrium was normal in size. - Tricuspid valve: There was no regurgitation. - Pulmonary arteries: Systolic pressure could not be accurately   estimated. - Inferior vena cava: The vessel was normal in size. - Pericardium, extracardiac: There was no pericardial effusion.  Antibiotics:  None   HPI/Subjective: Afebrile, no CP, no nausea, no vomiting; still feeling SOB. Using [place] supplementation for comfort.  Objective: Vitals:   08/20/16 1706 08/20/16 2108  BP:  (!) 121/59  Pulse: 76 78  Resp: 18 18  Temp:  97.7 F (36.5 C)    Intake/Output Summary (Last 24 hours) at 08/20/16 2307 Last data filed at 08/20/16 1200  Gross per 24 hour  Intake              720 ml  Output  600 ml  Net              120 ml   Filed Weights   08/19/16 1513 08/19/16 2304  Weight: (!) 170.1 kg (375 lb) (!) 168.7 kg (371 lb 14.4 oz)    Exam:   General:  Afebrile, slightly SOB and wearing oxygen supplementation. Patient is extremely obese and denies CP at this moment.  Cardiovascular: S1 and S2, no rubs or gallops  Respiratory: decrease BS  at bases bilaterally, no wheezing   Abdomen: obese, soft, NT, ND, positive BS  Musculoskeletal: 1-2 ++ edema bilaterally; no cyanosis   Data Reviewed: Basic Metabolic Panel:  Recent Labs Lab 08/19/16 1529 08/20/16 0527  NA 136 138  K 4.0 4.4  CL 102 100*  CO2 26 29  GLUCOSE 122* 273*  BUN 11 13  CREATININE 0.62 0.73  CALCIUM 8.9 9.1   Liver Function Tests:  Recent Labs Lab 08/19/16 1541  AST 29  ALT 27  ALKPHOS 124  BILITOT 0.4  PROT 7.5  ALBUMIN 3.5    Recent Labs Lab 08/19/16 1541  LIPASE 17   CBC:  Recent Labs Lab 08/19/16 1529  WBC 14.2*  HGB 12.1  HCT 36.3  MCV 82.5  PLT 409*   Cardiac Enzymes:  Recent Labs Lab 08/19/16 2128 08/20/16 0021 08/20/16 0527  TROPONINI <0.03 <0.03 <0.03   BNP (last 3 results)  Recent Labs  08/19/16 1529  BNP 155.6*    ProBNP (last 3 results) No results for input(s): PROBNP in the last 8760 hours.  CBG:  Recent Labs Lab 08/19/16 2309 08/20/16 0757 08/20/16 1143 08/20/16 1728 08/20/16 2152  GLUCAP 166* 230* 175* 197* 187*    Studies: Dg Chest 2 View  Result Date: 08/19/2016 CLINICAL DATA:  Shortness of breath. EXAM: CHEST  2 VIEW COMPARISON:  08/10/2016 .  03/10/2015. FINDINGS: Mediastinum and hilar structures normal. Cardiomegaly with mild pulmonary venous congestion. Very mild interstitial prominence noted. No pleural effusion or pneumothorax. No acute bony abnormality . IMPRESSION: Cardiomegaly with mild pulmonary venous congestion. Very mild pulmonary interstitial prominence. Mild interstitial edema cannot be excluded. Electronically Signed   By: Maisie Fus  Register   On: 08/19/2016 15:33   Ct Angio Chest Pe W Or Wo Contrast  Result Date: 08/19/2016 CLINICAL DATA:  Shortness of breath cough EXAM: CT ANGIOGRAPHY CHEST WITH CONTRAST TECHNIQUE: Multidetector CT imaging of the chest was performed using the standard protocol during bolus administration of intravenous contrast. Multiplanar CT image  reconstructions and MIPs were obtained to evaluate the vascular anatomy. CONTRAST:  100 mL Isovue 370 intravenous COMPARISON:  Chest x-ray 08/19/2016, CT 03/11/2015 FINDINGS: Cardiovascular: Suboptimal opacification of the pulmonary arteries. Examination also limited by large body habitus with resultant grainy images. No evidence of central pulmonary embolism. Non aneurysmal aorta. No obvious dissection. Mild atherosclerotic calcification. Coronary artery calcification. Mild cardiomegaly. No large pericardial effusion. Mediastinum/Nodes: Midline trachea. Thyroid within normal limits. No grossly enlarged mediastinal lymph nodes. Esophagus within normal limits. Lungs/Pleura: Lungs are clear. No pleural effusion or pneumothorax. Upper Abdomen: Spleen slightly enlarged at 14.5 cm. No acute abnormality. Musculoskeletal: Degenerative changes. No acute or suspicious bone lesion. Review of the MIP images confirms the above findings. IMPRESSION: 1. The examination is limited by habitus and sub optimal opacification of the pulmonary arterial system. No gross acute pulmonary embolus or aortic dissection is seen. 2. Mild cardiomegaly 3. No focal consolidations or pleural effusions. 4. Slightly enlarged spleen Electronically Signed   By: Adrian Prows.D.  On: 08/19/2016 18:51    Scheduled Meds: . aspirin EC  325 mg Oral Daily  . atenolol  50 mg Oral Daily  . enoxaparin (LOVENOX) injection  40 mg Subcutaneous QHS  . guaiFENesin  600 mg Oral BID  . insulin aspart  0-20 Units Subcutaneous TID WC  . insulin aspart  0-5 Units Subcutaneous QHS  . insulin aspart protamine- aspart  40 Units Subcutaneous BID WC  . ipratropium-albuterol  3 mL Nebulization TID  . loratadine  10 mg Oral Daily  . losartan  25 mg Oral Daily  . pantoprazole  40 mg Oral Daily  . predniSONE  40 mg Oral Q breakfast   Continuous Infusions:  Principal Problem:   Chest pain Active Problems:   Diabetes mellitus (HCC)   Hyperlipidemia,     Obesity   HTN (hypertension)   CAD (coronary artery disease) 40% LAD afte Dx1 08/05/11   Dyspnea on exertion   Acute bronchitis   GERD (gastroesophageal reflux disease)    Time spent: 25 minutes    Vassie Loll  [facility] Pager [phone]. If 7PM-7AM, please contact night-coverage at [email or web address], password [facility] 08/20/2016, 11:07 PM  LOS: 0 days

## 2016-08-21 DIAGNOSIS — E119 Type 2 diabetes mellitus without complications: Secondary | ICD-10-CM | POA: Diagnosis not present

## 2016-08-21 DIAGNOSIS — I5032 Chronic diastolic (congestive) heart failure: Secondary | ICD-10-CM

## 2016-08-21 DIAGNOSIS — E6609 Other obesity due to excess calories: Secondary | ICD-10-CM | POA: Diagnosis not present

## 2016-08-21 DIAGNOSIS — E785 Hyperlipidemia, unspecified: Secondary | ICD-10-CM

## 2016-08-21 DIAGNOSIS — J209 Acute bronchitis, unspecified: Secondary | ICD-10-CM | POA: Diagnosis not present

## 2016-08-21 DIAGNOSIS — R071 Chest pain on breathing: Secondary | ICD-10-CM | POA: Diagnosis not present

## 2016-08-21 DIAGNOSIS — R0602 Shortness of breath: Secondary | ICD-10-CM

## 2016-08-21 DIAGNOSIS — Z6841 Body Mass Index (BMI) 40.0 and over, adult: Secondary | ICD-10-CM | POA: Diagnosis not present

## 2016-08-21 DIAGNOSIS — I10 Essential (primary) hypertension: Secondary | ICD-10-CM | POA: Diagnosis not present

## 2016-08-21 DIAGNOSIS — Z794 Long term (current) use of insulin: Secondary | ICD-10-CM | POA: Diagnosis not present

## 2016-08-21 DIAGNOSIS — R0609 Other forms of dyspnea: Secondary | ICD-10-CM | POA: Diagnosis not present

## 2016-08-21 DIAGNOSIS — K219 Gastro-esophageal reflux disease without esophagitis: Secondary | ICD-10-CM | POA: Diagnosis not present

## 2016-08-21 LAB — GLUCOSE, CAPILLARY
Glucose-Capillary: 119 mg/dL — ABNORMAL HIGH (ref 65–99)
Glucose-Capillary: 200 mg/dL — ABNORMAL HIGH (ref 65–99)
Glucose-Capillary: 213 mg/dL — ABNORMAL HIGH (ref 65–99)

## 2016-08-21 LAB — BASIC METABOLIC PANEL
Anion gap: 7 (ref 5–15)
BUN: 17 mg/dL (ref 6–20)
CO2: 32 mmol/L (ref 22–32)
Calcium: 9.1 mg/dL (ref 8.9–10.3)
Chloride: 100 mmol/L — ABNORMAL LOW (ref 101–111)
Creatinine, Ser: 0.7 mg/dL (ref 0.44–1.00)
GFR calc Af Amer: >60 mL/min (ref >60)
GFR calc non Af Amer: >60 mL/min (ref >60)
Glucose, Bld: 123 mg/dL — ABNORMAL HIGH (ref 65–99)
Potassium: 4 mmol/L (ref 3.5–5.1)
Sodium: 139 mmol/L (ref 135–145)

## 2016-08-21 MED ORDER — PREDNISONE 20 MG PO TABS: ORAL_TABLET | ORAL | 0 refills | Status: DC

## 2016-08-21 MED ORDER — LORATADINE 10 MG PO TABS
10.0000 mg | ORAL_TABLET | Freq: Every day | ORAL | 0 refills | Status: AC
Start: 2016-08-22 — End: ?

## 2016-08-21 MED ORDER — GUAIFENESIN ER 600 MG PO TB12: 600.0000 mg | ORAL_TABLET | Freq: Two times a day (BID) | ORAL | 0 refills | Status: DC

## 2016-08-21 MED ORDER — DOXYCYCLINE HYCLATE 100 MG PO TABS: 100.0000 mg | ORAL_TABLET | Freq: Two times a day (BID) | ORAL | 0 refills | Status: AC

## 2016-08-21 MED ORDER — FUROSEMIDE 40 MG PO TABS: 40.0000 mg | ORAL_TABLET | Freq: Every day | ORAL | 1 refills | Status: AC

## 2016-08-21 NOTE — Progress Notes (Signed)
SATURATION QUALIFICATIONS: (This note is used to comply with regulatory documentation for home oxygen)  Patient Saturations on Room Air at Rest 90-94%  Patient Saturations on Room Air while Ambulating  88-92%  Patient Saturations on 2 Liters of oxygen while Ambulating  94-96%  Please briefly explain why patient needs home oxygen:  Pt ambulated without oxygen and maintain a sat of an average of 90%. She took rest periods b/t ambulation. Walked total of 100-125 feet with some shortness of breathes noted and rest period. Able to complete the lap. SRP, RN

## 2016-08-21 NOTE — Progress Notes (Signed)
Pt d/c to home.. Instructions given acknowleged understanding. SRP, RN

## 2016-08-21 NOTE — Discharge Summary (Signed)
Physician Discharge Summary  Elizabeth SCHLOTZHAUER Huber:[id number] DOB: 04-26-1961 DOA: 08/19/2016  PCP: Lolita Patella, MD  Admit date: 08/19/2016 Discharge date: 08/21/2016  Time spent: 35 minutes  Recommendations for Outpatient Follow-up:  1. Repeat BMET to follow electrolytes and renal function  2. Reassess BP and adjust medications as needed  3. Please arrange for sleep study as an outpatient  4. Assist with weight loss and referral to bariatric clinic    Discharge Diagnoses:  Principal Problem:   Chest pain Active Problems:   Diabetes mellitus (HCC)   Hyperlipidemia,    Obesity   HTN (hypertension)   CAD (coronary artery disease) 40% LAD afte Dx1 08/05/11   SOB (shortness of breath)   Acute bronchitis   GERD (gastroesophageal reflux disease)   Presumed Chronic diastolic HF (heart failure) (HCC)   Discharge Condition: stable and improved. Advise to follow up with PCP in 10 days.  Diet recommendation: low calorie and heart healthy diet   Filed Weights   08/19/16 1513 08/19/16 2304 08/21/16 0558  Weight: (!) 170.1 kg (375 lb) (!) 168.7 kg (371 lb 14.4 oz) (!) 167.2 kg (368 lb 9.8 oz)    History of present illness:  56 y.o.woman with a history of single vessel CAD diagnosed in 2013, HTN, HLD, GERD (plus hiatal hernia, controlled with Prilosec), Type 2 DM (with long term insulin use), and morbid obesity who presented to her PCP's office today for evaluation of chest pain and dyspnea on exertion. The patient reports progressive symptoms for one week. She has had cough and chest congestion as well as chest wall soreness. However, she also describes new dyspnea on exertion, reporting that she could not walk from her car to her doctor's office today without feeling breathless, which is associated with substernal chest pressure. She has had chills but no fever or sweats. No light-headedness or LOC.  Hospital Course:  1-SOB: per patient with DOE -most likely secondary to OSA/OHS  and acute bronchitis (see below for last item) -there is concerns for also pulmonary HTN and diastolic HF component; even not seen on echo. -received IV lasix, with increase in urine output and improvement in her breathing -per discussion with cardiology will add lasix daily at discharge; no further work up indicated as inpatient. -needs to drastically lose weight -patient encouraged to follow low sodium diet and be compliant with medications  -as mentioned below will benefit of sleep study as an outpatient   2-chest pain/DOE: -neg troponin -no ischemic changes on EKG or telemetry -echo reassuring  -will continue risk factors modification -adjust PPI dosage for better GERD protection (changed to twice a day) -advise to follow low sodium diet and will discharge on lasix    3-obesity  -needs aggressive weight loss -will benefit of bariatric clinic evaluation -low calorie diet and exercise discussed with patient  -Body mass index is 63.84 kg/m. -will benefit of outpatient sleep study (high concerns for sleep apnea)  4-HTN -will continue atenolol and cozaar -now also on lasix -encouraged to follow low sodium diet   5-allergic rhinitis, acute bronchitis and asthma exacerbation -will continue steroids tapering  -continue claritin and mucinex -will also discharge on course of doxycycline for bronchitis (patient endorses increase in expectoration and greenish sputum)  6-diabetes type 2 -will continue Novolin -patient advised to follow low carb diet  Procedures:  2-D echo - Left ventricle: The cavity size was normal. There was mild focal basal hypertrophy of the septum. Systolic function was normal. The estimated ejection fraction was  in the range of 60% to 65%. Wall motion was normal; there were no regional wall motion abnormalities. Left ventricular diastolic function parameters were normal. - Aortic valve: Trileaflet; normal thickness leaflets. There was  no regurgitation. - Aortic root: The aortic root was normal in size. - Mitral valve: Structurally normal valve. There was no regurgitation. - Right ventricle: The cavity size was normal. Wall thickness was normal. Systolic function was normal. - Right atrium: The atrium was normal in size. - Tricuspid valve: There was no regurgitation. - Pulmonary arteries: Systolic pressure could not be accurately estimated. - Inferior vena cava: The vessel was normal in size. - Pericardium, extracardiac: There was no pericardial effusion.  Consultations:  Cardiology   Discharge Exam: Vitals:   08/21/16 1125 08/21/16 1413  BP:  (!) 126/52  Pulse: 74 71  Resp:  18  Temp:  98.1 F (36.7 C)    General:  Afebrile, slightly SOB especially on continue exertion; not requiring O2 supplementation and with increased urine output after starting lasix. Patient is extremely obese and denies CP at this moment.  Cardiovascular: S1 and S2, no rubs or gallops; unable to properly assess for JVD due to body habitus   Respiratory: decrease BS at bases bilaterally, no wheezing and no frank crackles.  Abdomen: obese, soft, NT, ND, positive BS  Musculoskeletal: 1-2 ++ edema bilaterally; no cyanosis    Discharge Instructions   Discharge Instructions    Diet - low sodium heart healthy    Complete by:  As directed    Discharge instructions    Complete by:  As directed    Maintain adequate hydration Follow low sodium diet (less than 2 gram daily) Aggressive weight loss program  Arrange follow up with PCP in 10 days Take medications as prescribed     Current Discharge Medication List    START taking these medications   Details  doxycycline (VIBRA-TABS) 100 MG tablet Take 1 tablet (100 mg total) by mouth 2 (two) times daily. Qty: 14 tablet, Refills: 0    furosemide (LASIX) 40 MG tablet Take 1 tablet (40 mg total) by mouth daily. Qty: 30 tablet, Refills: 1    guaiFENesin (MUCINEX) 600 MG 12  hr tablet Take 1 tablet (600 mg total) by mouth 2 (two) times daily. Qty: 30 tablet, Refills: 0    loratadine (CLARITIN) 10 MG tablet Take 1 tablet (10 mg total) by mouth daily. Qty: 30 tablet, Refills: 0    predniSONE (DELTASONE) 20 MG tablet Take 2 tablets by mouth daily X 1 day; then 1 tablet by mouth daily X 3 days; then 1/2 tablet daily X 3 days and stop prednisone. Qty: 10 tablet, Refills: 0      CONTINUE these medications which have NOT CHANGED   Details  acetaminophen (TYLENOL) 500 MG tablet Take 1,000 mg by mouth every 6 (six) hours as needed for moderate pain or headache.     aspirin EC 81 MG tablet Take 162 mg by mouth 2 (two) times daily as needed for mild pain.    atenolol (TENORMIN) 50 MG tablet Take 50 mg by mouth daily.    insulin NPH-regular (NOVOLIN 70/30) (70-30) 100 UNIT/ML injection Inject 80 Units into the skin 2 (two) times daily with a meal.     losartan (COZAAR) 25 MG tablet Take 25 mg by mouth daily.    Multiple Vitamins-Minerals (CENTRUM PO) Take 1 tablet by mouth daily.     omeprazole (PRILOSEC) 20 MG capsule Take 20 mg by mouth  2 (two) times daily before a meal.      STOP taking these medications     Camphor-Eucalyptus-Menthol (VICKS VAPORUB) 4.7-1.2-2.6 % OINT      cephALEXin (KEFLEX) 500 MG capsule      guaifenesin (ROBITUSSIN) 100 MG/5ML syrup        Allergies  Allergen Reactions  . Hydrocodone Other (See Comments)    Makes heart race  . Oxycodone Anxiety  . Morphine And Related Other (See Comments)    Unknown   . Tradjenta [Linagliptin] Anxiety  . Vicodin [Hydrocodone-Acetaminophen] Palpitations   Follow-up Information    Lolita Patella, MD. Schedule an appointment as soon as possible for a visit in 10 day(s).   Specialty:  Family Medicine Contact information: [phone] [place] [address] [phone]        Talmage Coin, MD .   Specialty:  Endocrinology Contact information: [address] [place] [address] [phone]           The results of significant diagnostics from this hospitalization (including imaging, microbiology, ancillary and laboratory) are listed below for reference.    Significant Diagnostic Studies: Dg Chest 2 View  Result Date: 08/19/2016 CLINICAL DATA:  Shortness of breath. EXAM: CHEST  2 VIEW COMPARISON:  08/10/2016 .  03/10/2015. FINDINGS: Mediastinum and hilar structures normal. Cardiomegaly with mild pulmonary venous congestion. Very mild interstitial prominence noted. No pleural effusion or pneumothorax. No acute bony abnormality . IMPRESSION: Cardiomegaly with mild pulmonary venous congestion. Very mild pulmonary interstitial prominence. Mild interstitial edema cannot be excluded. Electronically Signed   By: Maisie Fus  Register   On: 08/19/2016 15:33   Dg Chest 2 View  Result Date: 08/10/2016 CLINICAL DATA:  Hypoxia EXAM: CHEST  2 VIEW COMPARISON:  03/11/2015 FINDINGS: Cardiomediastinal silhouette is stable. Cardiomediastinal silhouette is unremarkable. No infiltrate or pleural effusion. No pulmonary edema. Mild degenerative changes lower thoracic spine. IMPRESSION: No active cardiopulmonary disease. Mild degenerative changes lower thoracic spine. Electronically Signed   By: Natasha Mead M.D.   On: 08/10/2016 16:52   Ct Angio Chest Pe W Or Wo Contrast  Result Date: 08/19/2016 CLINICAL DATA:  Shortness of breath cough EXAM: CT ANGIOGRAPHY CHEST WITH CONTRAST TECHNIQUE: Multidetector CT imaging of the chest was performed using the standard protocol during bolus administration of intravenous contrast. Multiplanar CT image reconstructions and MIPs were obtained to evaluate the vascular anatomy. CONTRAST:  100 mL Isovue 370 intravenous COMPARISON:  Chest x-ray 08/19/2016, CT 03/11/2015 FINDINGS: Cardiovascular: Suboptimal opacification of the pulmonary arteries. Examination also limited by large body habitus with resultant grainy images. No  evidence of central pulmonary embolism. Non aneurysmal aorta. No obvious dissection. Mild atherosclerotic calcification. Coronary artery calcification. Mild cardiomegaly. No large pericardial effusion. Mediastinum/Nodes: Midline trachea. Thyroid within normal limits. No grossly enlarged mediastinal lymph nodes. Esophagus within normal limits. Lungs/Pleura: Lungs are clear. No pleural effusion or pneumothorax. Upper Abdomen: Spleen slightly enlarged at 14.5 cm. No acute abnormality. Musculoskeletal: Degenerative changes. No acute or suspicious bone lesion. Review of the MIP images confirms the above findings. IMPRESSION: 1. The examination is limited by habitus and sub optimal opacification of the pulmonary arterial system. No gross acute pulmonary embolus or aortic dissection is seen. 2. Mild cardiomegaly 3. No focal consolidations or pleural effusions. 4. Slightly enlarged spleen Electronically Signed   By: Jasmine Pang M.D.   On: 08/19/2016 18:51   Ct Abdomen Pelvis W Contrast  Result Date: 08/10/2016 CLINICAL DATA:  Progressive RT sided abdominal pain today  with nausea. EXAM: CT ABDOMEN AND PELVIS WITH CONTRAST TECHNIQUE: Multidetector CT imaging of the abdomen and pelvis was performed using the standard protocol following bolus administration of intravenous contrast. CONTRAST:  1 ISOVUE-300 IOPAMIDOL (ISOVUE-300) INJECTION 61% COMPARISON:  06/28/2016 FINDINGS: Lower chest: Lung bases are clear. Heart size is normal. No imaged pericardial effusion or significant coronary artery calcifications. Study quality is degraded by patient motion artifact. Hepatobiliary: No focal liver abnormality is seen. No gallstones, gallbladder wall thickening, or biliary dilatation. Pancreas: Unremarkable. No pancreatic ductal dilatation or surrounding inflammatory changes. Spleen: Normal in size without focal abnormality. Adrenals/Urinary Tract: Adrenal glands are unremarkable. Kidneys are normal, without renal calculi, focal  lesion, or hydronephrosis. Bladder is unremarkable. Stomach/Bowel: The stomach and small bowel loops are normal in appearance. Colon is normal in appearance. Average stool burden. The appendix is well seen and has a normal appearance. Vascular/Lymphatic: No significant vascular findings are present. No enlarged abdominal or pelvic lymph nodes. Reproductive: Uterus is present and is anteflexed. Left ovary has a normal appearance. Status post right salpingo-oophorectomy. Other: No abdominal wall hernia or abnormality. No abdominopelvic ascites. Musculoskeletal: There is facet hypertrophy and degenerative change in the lower lumbar spine. Bilateral SI joint sclerosis. No suspicious lytic or blastic lesions are identified. IMPRESSION: 1.  No evidence for acute  abnormality. 2. Normal appendix. 3. Average stool burden. 4. Status post right salpingo-oophorectomy. 5. Degenerative changes in the lower lumbar spine and SI joints. Electronically Signed   By: Norva Pavlov M.D.   On: 08/10/2016 16:21    Microbiology: No results found for this or any previous visit (from the past 240 hour(s)).   Labs: Basic Metabolic Panel:  Recent Labs Lab 08/19/16 1529 08/20/16 0527 08/21/16 0504  NA 136 138 139  K 4.0 4.4 4.0  CL 102 100* 100*  CO2 26 29 32  GLUCOSE 122* 273* 123*  BUN CREATININE 0.62 0.73 0.70  CALCIUM 8.9 9.1 9.1   Liver Function Tests:  Recent Labs Lab 08/19/16 1541  AST 29  ALT 27  ALKPHOS 124  BILITOT 0.4  PROT 7.5  ALBUMIN 3.5    Recent Labs Lab 08/19/16 1541  LIPASE 17   CBC:  Recent Labs Lab 08/19/16 1529  WBC 14.2*  HGB 12.1  HCT 36.3  MCV 82.5  PLT 409*   Cardiac Enzymes:  Recent Labs Lab 08/19/16 2128 08/20/16 0021 08/20/16 0527  TROPONINI <0.03 <0.03 <0.03   BNP: BNP (last 3 results)  Recent Labs  08/19/16 1529  BNP 155.6*   CBG:  Recent Labs Lab 08/20/16 1143 08/20/16 1728 08/20/16 2152 08/21/16 0746 08/21/16 1146  GLUCAP  175* 197* 187* 119* 200*    Signed:  Vassie Loll MD.  [facility] 08/21/2016, 4:29 PM

## 2016-09-16 ENCOUNTER — Other Ambulatory Visit: Payer: Self-pay | Admitting: Physician Assistant

## 2016-09-16 DIAGNOSIS — R229 Localized swelling, mass and lump, unspecified: Secondary | ICD-10-CM

## 2017-02-19 ENCOUNTER — Emergency Department (HOSPITAL_COMMUNITY): Payer: BLUE CROSS/BLUE SHIELD

## 2017-02-19 ENCOUNTER — Emergency Department (HOSPITAL_COMMUNITY)
Admission: EM | Admit: 2017-02-19 | Discharge: 2017-02-19 | Disposition: A | Discharge status: Home / Self Care | Payer: BLUE CROSS/BLUE SHIELD | Attending: Emergency Medicine | Admitting: Emergency Medicine

## 2017-02-19 ENCOUNTER — Encounter (HOSPITAL_COMMUNITY): Payer: Self-pay | Admitting: Emergency Medicine

## 2017-02-19 DIAGNOSIS — I509 Heart failure, unspecified: Secondary | ICD-10-CM | POA: Insufficient documentation

## 2017-02-19 DIAGNOSIS — M545 Low back pain, unspecified: Secondary | ICD-10-CM

## 2017-02-19 DIAGNOSIS — E785 Hyperlipidemia, unspecified: Secondary | ICD-10-CM | POA: Insufficient documentation

## 2017-02-19 DIAGNOSIS — E119 Type 2 diabetes mellitus without complications: Secondary | ICD-10-CM | POA: Insufficient documentation

## 2017-02-19 DIAGNOSIS — I110 Hypertensive heart disease with heart failure: Secondary | ICD-10-CM | POA: Diagnosis not present

## 2017-02-19 DIAGNOSIS — Z7982 Long term (current) use of aspirin: Secondary | ICD-10-CM | POA: Diagnosis not present

## 2017-02-19 DIAGNOSIS — R0789 Other chest pain: Secondary | ICD-10-CM | POA: Insufficient documentation

## 2017-02-19 DIAGNOSIS — J45909 Unspecified asthma, uncomplicated: Secondary | ICD-10-CM | POA: Diagnosis not present

## 2017-02-19 DIAGNOSIS — R05 Cough: Secondary | ICD-10-CM | POA: Diagnosis present

## 2017-02-19 DIAGNOSIS — Z79899 Other long term (current) drug therapy: Secondary | ICD-10-CM | POA: Diagnosis not present

## 2017-02-19 DIAGNOSIS — J069 Acute upper respiratory infection, unspecified: Secondary | ICD-10-CM | POA: Insufficient documentation

## 2017-02-19 LAB — CBC
HCT: 37 % (ref 36.0–46.0)
Hemoglobin: 12.6 g/dL (ref 12.0–15.0)
MCH: 27.6 pg (ref 26.0–34.0)
MCHC: 34.1 g/dL (ref 30.0–36.0)
MCV: 81.1 fL (ref 78.0–100.0)
Platelets: 357 10*3/uL (ref 150–400)
RBC: 4.56 MIL/uL (ref 3.87–5.11)
RDW: 12.9 % (ref 11.5–15.5)
WBC: 10.6 10*3/uL — ABNORMAL HIGH (ref 4.0–10.5)

## 2017-02-19 LAB — COMPREHENSIVE METABOLIC PANEL
ALT: 16 U/L (ref 14–54)
AST: 16 U/L (ref 15–41)
Albumin: 3.6 g/dL (ref 3.5–5.0)
Alkaline Phosphatase: 137 U/L — ABNORMAL HIGH (ref 38–126)
Anion gap: 9 (ref 5–15)
BUN: 17 mg/dL (ref 6–20)
CO2: 29 mmol/L (ref 22–32)
Calcium: 9.1 mg/dL (ref 8.9–10.3)
Chloride: 101 mmol/L (ref 101–111)
Creatinine, Ser: 0.76 mg/dL (ref 0.44–1.00)
GFR calc Af Amer: >60 mL/min (ref >60)
GFR calc non Af Amer: >60 mL/min (ref >60)
Glucose, Bld: 109 mg/dL — ABNORMAL HIGH (ref 65–99)
Potassium: 4.4 mmol/L (ref 3.5–5.1)
Sodium: 139 mmol/L (ref 135–145)
Total Bilirubin: 0.7 mg/dL (ref 0.3–1.2)
Total Protein: 7.6 g/dL (ref 6.5–8.1)

## 2017-02-19 LAB — URINALYSIS, ROUTINE W REFLEX MICROSCOPIC
Bilirubin Urine: NEGATIVE
Glucose, UA: NEGATIVE mg/dL
Hgb urine dipstick: NEGATIVE
Ketones, ur: NEGATIVE mg/dL
Leukocytes, UA: NEGATIVE
Nitrite: NEGATIVE
Protein, ur: NEGATIVE mg/dL
Specific Gravity, Urine: 1.013 (ref 1.005–1.030)
pH: 7 (ref 5.0–8.0)

## 2017-02-19 LAB — LIPASE, BLOOD: Lipase: 19 U/L (ref 11–51)

## 2017-02-19 NOTE — ED Provider Notes (Signed)
[facility] DEPT Provider Note   CSN: [id number] Arrival date & time: 02/19/17  0932     History   Chief Complaint Chief Complaint  Patient presents with  . Cough  . Hip Pain    L  . Abdominal Pain  . Back Pain    R lower    HPI Elizabeth Huber is a 56 y.o. female.  Patient is 56 year old female with a history of diabetes, hypertension, hyperlipidemia and morbid obesity who presents with multiple complaints. She states she mostly came in because she's having some pain in her lower back going around her hips. She's had a history of back pain in the past and says this is little bit worse. She denies any radiation down her legs. She denies any numbness or weakness or legs. No loss of bowel or bladder control. She also has a cough that's been going on for about a week which is nonproductive. She denies any shortness of breath. She has some pain to her left chest which she feels is related to the coughing. It's worse with coughing. It's not pleuritic in nature. She also has some diarrhea and complaints of lower abdominal cramping. She states her diarrhea as watery and one on about 2 days. There is no associated blood. No nausea or vomiting. No known fevers. She does have some urinary frequency and burning on urination as well. She has not been taking anything at home for the symptoms.      Past Medical History:  Diagnosis Date  . Angina   . Asthma   . Chronic bronchitis    "have it whenever I have a cold"  . Diabetes mellitus type 2 in obese ([place])   . Dysrhythmia 08/03/11   "palpitations"  . GERD (gastroesophageal reflux disease)   . Headache(784.0)   . Herpes simplex    "type 1 & 2"  . High cholesterol   . Hypertension   . Phlebitis   . Shortness of breath on exertion     Patient Active Problem List   Diagnosis Date Noted  . Chronic diastolic HF (heart failure) ([place])   . Chest pain 08/19/2016  . SOB (shortness of breath) 08/19/2016  . Acute bronchitis 08/19/2016  .  GERD (gastroesophageal reflux disease) 08/19/2016  . CAD (coronary artery disease) 40% LAD afte Dx1 08/05/11 08/06/2011  . PSVT (paroxysmal supraventricular tachycardia) ([place]) 08/06/2011  . Chest pain 08/04/2011  . Diabetes mellitus ([facility]) 08/04/2011  . Hyperlipidemia,  08/04/2011  . Tachycardia, sinus tach on admission 08/04/2011  . Obesity 08/04/2011  . HTN (hypertension) 08/04/2011    Past Surgical History:  Procedure Laterality Date  . CORONARY ANGIOGRAM N/A 08/05/2011   Procedure: CORONARY ANGIOGRAM;  Surgeon: Sanda Klein, MD;  Location: [facility] CATH LAB;  Service: Cardiovascular;  Laterality: N/A;  . SALPINGECTOMY     w/ovary removal; "right"  . SALPINGOOPHORECTOMY    . TOENAIL EXCISION      OB History    No data available       Home Medications    Prior to Admission medications   Medication Sig Start Date End Date Taking? Authorizing Provider  acetaminophen (TYLENOL) 500 MG tablet Take 500 mg by mouth every 6 (six) hours as needed for moderate pain or headache.    Yes [provider]  aspirin EC 81 MG tablet Take 162 mg by mouth 2 (two) times daily as needed for mild pain.   Yes [provider]  atenolol (TENORMIN) 50 MG tablet Take 50 mg  by mouth daily.   Yes [provider]  atorvastatin (LIPITOR) 10 MG tablet Take 10 mg by mouth every Monday, Wednesday, and Friday.  01/30/17  Yes [provider]  furosemide (LASIX) 40 MG tablet Take 1 tablet (40 mg total) by mouth daily. 08/22/16  Yes Barton Dubois, MD  guaifenesin (ROBITUSSIN) 100 MG/5ML syrup Take 400 mg by mouth 3 (three) times daily as needed for cough or congestion.   Yes [provider]  insulin NPH-regular (NOVOLIN 70/30) (70-30) 100 UNIT/ML injection Inject 80 Units into the skin 2 (two) times daily with a meal.    Yes [provider]  loratadine (CLARITIN) 10 MG tablet Take 1 tablet (10 mg total) by mouth daily. 08/22/16  Yes Barton Dubois, MD  losartan (COZAAR)  25 MG tablet Take 25 mg by mouth daily.   Yes [provider]  Multiple Vitamins-Minerals (CENTRUM PO) Take 1 tablet by mouth daily.    Yes [provider]  omeprazole (PRILOSEC) 20 MG capsule Take 20 mg by mouth 2 (two) times daily before a meal.   Yes [provider]    Family History Family History  Problem Relation Age of Onset  . Diabetes Mother   . Hypertension Mother   . CAD Father   . Diabetes Sister   . Healthy Brother     Social History Social History  Substance Use Topics  . Smoking status: Never Smoker  . Smokeless tobacco: Never Used  . Alcohol use No     Allergies   Hydrocodone; Oxycodone; Morphine and related; Tradjenta [linagliptin]; and Vicodin [hydrocodone-acetaminophen]   Review of Systems Review of Systems  Constitutional: Positive for fatigue. Negative for chills, diaphoresis and fever.  HENT: Negative for congestion, rhinorrhea and sneezing.   Eyes: Negative.   Respiratory: Positive for cough. Negative for chest tightness and shortness of breath.   Cardiovascular: Positive for chest pain. Negative for leg swelling.  Gastrointestinal: Positive for abdominal pain and diarrhea. Negative for blood in stool, nausea and vomiting.  Genitourinary: Negative for difficulty urinating, flank pain, frequency and hematuria.  Musculoskeletal: Positive for back pain. Negative for arthralgias.  Skin: Negative for rash.  Neurological: Negative for dizziness, speech difficulty, weakness, numbness and headaches.     Physical Exam Updated Vital Signs BP (!) 128/58   Pulse 82   Temp 98.7 F (37.1 C) (Oral)   Resp 19   LMP 05/05/2011   SpO2 94%   Physical Exam  Constitutional: She is oriented to person, place, and time. She appears well-developed and well-nourished.  Morbidly obese  HENT:  Head: Normocephalic and atraumatic.  Eyes: Pupils are equal, round, and reactive to light.  Neck: Normal range of motion. Neck supple.    Cardiovascular: Normal rate, regular rhythm and normal heart sounds.   Pulmonary/Chest: Effort normal and breath sounds normal. No respiratory distress. She has no wheezes. She has no rales. She exhibits tenderness (positive tenderness across the left chest wall).  Abdominal: Soft. Bowel sounds are normal. There is tenderness (mild tenderness across the lower abdomen). There is no rebound and no guarding.  Musculoskeletal: Normal range of motion. She exhibits no edema.  Positive tenderness across the lower lumbar region. She has normal sensation and motor function in the legs. Pedal pulses are intact. Negative straight leg raise bilaterally. She has no pain on range of motion of the hips.  Lymphadenopathy:    She has no cervical adenopathy.  Neurological: She is alert and oriented to person, place, and time.  Skin: Skin is warm and dry. No rash noted.  Psychiatric: She has a normal mood and affect.     ED Treatments / Results  Labs (all labs ordered are listed, but only abnormal results are displayed) Labs Reviewed  COMPREHENSIVE METABOLIC PANEL - Abnormal; Notable for the following:       Result Value   Glucose, Bld 109 (*)    Alkaline Phosphatase 137 (*)    All other components within normal limits  CBC - Abnormal; Notable for the following:    WBC 10.6 (*)    All other components within normal limits  LIPASE, BLOOD  URINALYSIS, ROUTINE W REFLEX MICROSCOPIC    EKG  EKG Interpretation  Date/Time:  Thursday February 19 2017 09:42:04 EDT Ventricular Rate:  76 PR Interval:    QRS Duration: 97 QT Interval:  408 QTC Calculation: 459 R Axis:   43 Text Interpretation:  Sinus rhythm Low voltage, precordial leads Baseline wander in lead(s) V1 since last tracing no significant change Confirmed by Malvin Johns [id number]) on 02/19/2017 9:49:48 AM       Radiology Dg Chest 2 View  Result Date: 02/19/2017 CLINICAL DATA:  Cough and congestion. Shortness of breath over the last 3-4  days. EXAM: CHEST  2 VIEW COMPARISON:  08/19/2016 chest CT FINDINGS: Normal heart size and mediastinal contours. No acute infiltrate or edema. No effusion or pneumothorax. Spondylosis. There are surgical clips in the left back. No acute osseous findings. IMPRESSION: No evidence of active disease. Electronically Signed   By: Monte Fantasia M.D.   On: 02/19/2017 10:38    Procedures Procedures (including critical care time)  Medications Ordered in ED Medications - No data to display   Initial Impression / Assessment and Plan / ED Course  I have reviewed the triage vital signs and the nursing notes.  Pertinent labs & imaging results that were available during my care of the patient were reviewed by me and considered in my medical decision making (see chart for details).     Patient is a 56 year old female who presents with a variety of symptoms. Her main symptoms is cough and congestion as well as lower back pain.  She has no fever. She is neurologically intact. No signs of cauda equina.No evidence of pneumonia. Her labs are non-concerning.  Her alk phos was mildly elevated, but she has no pain around her gallbladder.  Other LFTs are normal.  She has no significant abdominal tenderness on exam. Her urinalysis does not show signs of infection. She's asking for something to eat. She's otherwise well-appearing. I discussed the need for a CT scan of her abdomen at this point it was decided that she was weak for 24 hours to see how her pain improves. If she has any worsening pain she is to return here about point awaken rethinking the idea of CT imaging. This point I have a low suspicion of intra-abdominal pathology. She was discharged home in good condition. I feel her symptoms are likely resulting from a viral URI. She will continue taking Mucinex at home for the congestion and cough. She will follow-up with her primary care provider or return here as needed for any worsening symptoms.  Final Clinical  Impressions(s) / ED Diagnoses   Final diagnoses:  Acute midline low back pain without sciatica  Viral upper respiratory tract infection    New Prescriptions New Prescriptions   No medications on file     Malvin Johns, MD 02/19/17 1318

## 2017-02-19 NOTE — ED Notes (Signed)
Pt ambulatory and independent at discharge.  Verbalized understanding of discharge instructions 

## 2017-02-19 NOTE — ED Triage Notes (Signed)
Pt with productive cough x 3-4 days,  C/o L hip pain and bilateral lower abdominal pain. Pt also c/o R lower back pain. Pt states she had diarrhea since yesterday.

## 2017-06-04 ENCOUNTER — Other Ambulatory Visit: Payer: Self-pay | Admitting: Specialist

## 2017-06-04 DIAGNOSIS — Z139 Encounter for screening, unspecified: Secondary | ICD-10-CM

## 2017-06-23 ENCOUNTER — Other Ambulatory Visit: Payer: Self-pay | Admitting: Specialist

## 2017-06-23 DIAGNOSIS — N644 Mastodynia: Secondary | ICD-10-CM

## 2017-06-29 ENCOUNTER — Other Ambulatory Visit: Payer: No Typology Code available for payment source

## 2017-06-30 ENCOUNTER — Ambulatory Visit
Admission: RE | Admit: 2017-06-30 | Discharge: 2017-06-30 | Disposition: A | Discharge status: Home / Self Care | Payer: BLUE CROSS/BLUE SHIELD | Source: Ambulatory Visit | Attending: Specialist | Admitting: Specialist

## 2017-06-30 ENCOUNTER — Ambulatory Visit
Admission: RE | Admit: 2017-06-30 | Discharge: 2017-06-30 | Disposition: A | Discharge status: Home / Self Care | Payer: No Typology Code available for payment source | Source: Ambulatory Visit | Attending: Specialist | Admitting: Specialist

## 2017-06-30 DIAGNOSIS — N644 Mastodynia: Secondary | ICD-10-CM

## 2017-07-06 ENCOUNTER — Ambulatory Visit: Payer: No Typology Code available for payment source

## 2018-09-02 ENCOUNTER — Other Ambulatory Visit: Payer: Self-pay | Admitting: Obstetrics

## 2018-11-04 ENCOUNTER — Ambulatory Visit
Admission: RE | Admit: 2018-11-04 | Discharge: 2018-11-04 | Disposition: A | Discharge status: Home / Self Care | Payer: BC Managed Care – PPO | Source: Ambulatory Visit | Attending: Family Medicine | Admitting: Family Medicine

## 2018-11-04 ENCOUNTER — Other Ambulatory Visit: Payer: Self-pay | Admitting: Family Medicine

## 2018-11-04 ENCOUNTER — Other Ambulatory Visit: Payer: Self-pay

## 2018-11-04 DIAGNOSIS — R1032 Left lower quadrant pain: Secondary | ICD-10-CM

## 2018-11-04 MED ORDER — IOPAMIDOL (ISOVUE-300) INJECTION 61%
125.0000 mL | Freq: Once | INTRAVENOUS | Status: AC | PRN
  Administered 2018-11-04: 14:00:00 125 mL via INTRAVENOUS

## 2020-10-17 ENCOUNTER — Other Ambulatory Visit: Payer: Self-pay

## 2020-10-17 ENCOUNTER — Emergency Department (HOSPITAL_BASED_OUTPATIENT_CLINIC_OR_DEPARTMENT_OTHER)
Admission: EM | Admit: 2020-10-17 | Discharge: 2020-10-18 | Disposition: A | Discharge status: Home / Self Care | Payer: BC Managed Care – PPO | Attending: Emergency Medicine | Admitting: Emergency Medicine

## 2020-10-17 DIAGNOSIS — M25511 Pain in right shoulder: Secondary | ICD-10-CM | POA: Diagnosis present

## 2020-10-17 DIAGNOSIS — M75101 Unspecified rotator cuff tear or rupture of right shoulder, not specified as traumatic: Secondary | ICD-10-CM | POA: Insufficient documentation

## 2020-10-17 DIAGNOSIS — Z79899 Other long term (current) drug therapy: Secondary | ICD-10-CM | POA: Insufficient documentation

## 2020-10-17 DIAGNOSIS — J45909 Unspecified asthma, uncomplicated: Secondary | ICD-10-CM | POA: Diagnosis not present

## 2020-10-17 DIAGNOSIS — I10 Essential (primary) hypertension: Secondary | ICD-10-CM | POA: Diagnosis not present

## 2020-10-17 DIAGNOSIS — U071 COVID-19: Secondary | ICD-10-CM | POA: Insufficient documentation

## 2020-10-17 DIAGNOSIS — E119 Type 2 diabetes mellitus without complications: Secondary | ICD-10-CM | POA: Insufficient documentation

## 2020-10-17 DIAGNOSIS — Z794 Long term (current) use of insulin: Secondary | ICD-10-CM | POA: Insufficient documentation

## 2020-10-18 ENCOUNTER — Emergency Department (HOSPITAL_BASED_OUTPATIENT_CLINIC_OR_DEPARTMENT_OTHER): Payer: BC Managed Care – PPO

## 2020-10-18 ENCOUNTER — Other Ambulatory Visit: Payer: Self-pay

## 2020-10-18 ENCOUNTER — Encounter (HOSPITAL_BASED_OUTPATIENT_CLINIC_OR_DEPARTMENT_OTHER): Payer: Self-pay | Admitting: *Deleted

## 2020-10-18 MED ORDER — MELOXICAM 15 MG PO TABS: ORAL_TABLET | ORAL | 0 refills | Status: DC

## 2020-10-18 MED ORDER — NAPROXEN 250 MG PO TABS
500.0000 mg | ORAL_TABLET | Freq: Once | ORAL | Status: AC
  Administered 2020-10-18: 500 mg via ORAL
  Filled 2020-10-18: qty 2

## 2020-10-18 NOTE — ED Provider Notes (Signed)
[facility] Provider Note: Lowella Dell, MD, FACEP  CSN: [id number] MRN: [id number] ARRIVAL: 10/17/20 at 2356 ROOM: MH11/MH11   CHIEF COMPLAINT  Shoulder Pain   HISTORY OF PRESENT ILLNESS  10/18/20 12:07 AM Elizabeth Huber is a 60 y.o. female with right shoulder pain for the past 2 days.  She did not injure her shoulder or lift anything heavy and does not know what triggered the pain.  The pain is located in her right shoulder radiating to her right pectoralis region and her right upper arm.  The pain is worse with movement of her right shoulder, notably with abduction or internal rotation.  There is also tenderness to palpation of the right upper chest.  She rates her pain is a 10 out of 10.  She has been taking ibuprofen without adequate relief.  She tested positive for COVID about a week ago and had fever for several days but that has resolved.  She has some lingering dyspnea on exertion but otherwise has no respiratory complaints.   Past Medical History:  Diagnosis Date  . Angina   . Asthma   . Chronic bronchitis    "have it whenever I have a cold"  . Diabetes mellitus type 2 in obese (HCC)   . Dysrhythmia 08/03/11   "palpitations"  . GERD (gastroesophageal reflux disease)   . Headache(784.0)   . Herpes simplex    "type 1 & 2"  . High cholesterol   . Hypertension   . Phlebitis   . Shortness of breath on exertion     Past Surgical History:  Procedure Laterality Date  . CORONARY ANGIOGRAM N/A 08/05/2011   Procedure: CORONARY ANGIOGRAM;  Surgeon: Thurmon Fair, MD;  [facility];  Service: Cardiovascular;  Laterality: N/A;  . SALPINGECTOMY     w/ovary removal; "right"  . SALPINGOOPHORECTOMY    . TOENAIL EXCISION      Family History  Problem Relation Age of Onset  . Diabetes Mother   . Hypertension Mother   . CAD Father   . Diabetes Sister   . Healthy Brother   . Breast cancer Neg Hx     Social History   Tobacco Use  . Smoking status:  Never Smoker  . Smokeless tobacco: Never Used  Substance Use Topics  . Alcohol use: No  . Drug use: No    Prior to Admission medications   Medication Sig Start Date End Date Taking? Authorizing Provider  meloxicam (MOBIC) 15 MG tablet Take 1 tablet daily for shoulder pain. 10/18/20  Yes Guy Toney, MD  acetaminophen (TYLENOL) 500 MG tablet Take 500 mg by mouth every 6 (six) hours as needed for moderate pain or headache.     [provider]  aspirin EC 81 MG tablet Take 162 mg by mouth 2 (two) times daily as needed for mild pain.    [provider]  atenolol (TENORMIN) 50 MG tablet Take 50 mg by mouth daily.    [provider]  atorvastatin (LIPITOR) 10 MG tablet Take 10 mg by mouth every Monday, Wednesday, and Friday.  01/30/17   [provider]  furosemide (LASIX) 40 MG tablet Take 1 tablet (40 mg total) by mouth daily. 08/22/16   Vassie Loll, MD  guaifenesin (ROBITUSSIN) 100 MG/5ML syrup Take 400 mg by mouth 3 (three) times daily as needed for cough or congestion.    [provider]  insulin NPH-regular (NOVOLIN 70/30) (70-30) 100 UNIT/ML injection Inject 80 Units into the  skin 2 (two) times daily with a meal.     [provider]  loratadine (CLARITIN) 10 MG tablet Take 1 tablet (10 mg total) by mouth daily. 08/22/16   Vassie Loll, MD  losartan (COZAAR) 25 MG tablet Take 25 mg by mouth daily.    [provider]  Multiple Vitamins-Minerals (CENTRUM PO) Take 1 tablet by mouth daily.     [provider]  omeprazole (PRILOSEC) 20 MG capsule Take 20 mg by mouth 2 (two) times daily before a meal.    [provider]    Allergies Hydrocodone, Oxycodone, Morphine and related, Tradjenta [linagliptin], and Vicodin [hydrocodone-acetaminophen]   REVIEW OF SYSTEMS  Negative except as noted here or in the History of Present Illness.   PHYSICAL EXAMINATION  Initial Vital Signs Blood pressure (!) 175/65, pulse 83,  temperature 97.6 F (36.4 C), temperature source Oral, resp. rate 18, height 5\' 4"  (1.626 m), weight (!) 170.1 kg, last menstrual period 05/05/2011, SpO2 94 %.  Examination General: Well-developed, high BMI female in no acute distress; appearance consistent with age of record HENT: normocephalic; atraumatic Eyes: pupils equal, round and reactive to light; extraocular muscles intact Neck: supple Heart: regular rate and rhythm Lungs: clear to auscultation bilaterally Chest: Tenderness of right pectoralis muscles Abdomen: soft; nondistended; nontender; bowel sounds present Extremities: No deformity; pain on passive or active movement of right shoulder, notably abduction and internal rotation Neurologic: Awake, alert and oriented; motor function intact in all extremities and symmetric; no facial droop Skin: Warm and dry Psychiatric: Normal mood and affect   RESULTS  Summary of this visit's results, reviewed and interpreted by myself:   EKG Interpretation  Date/Time:  Thursday October 18 2020 00:18:19 EDT Ventricular Rate:  76 PR Interval:  137 QRS Duration: 99 QT Interval:  408 QTC Calculation: 459 R Axis:   83 Text Interpretation: Sinus rhythm Normal ECG No significant change was found Confirmed by Adell Koval (12-09-1997) on 10/18/2020 12:28:06 AM      Laboratory Studies: No results found for this or any previous visit (from the past 24 hour(s)). Imaging Studies: DG Chest Port 1 View  Result Date: 10/18/2020 CLINICAL DATA:  COVID EXAM: PORTABLE CHEST 1 VIEW COMPARISON:  02/19/2017 FINDINGS: The heart size and mediastinal contours are within normal limits. Both lungs are clear. The visualized skeletal structures are unremarkable. IMPRESSION: No active disease. Electronically Signed   By: 04/21/2017 M.D.   On: 10/18/2020 00:42   DG Shoulder Right Portable  Result Date: 10/18/2020 CLINICAL DATA:  Pain on movement EXAM: PORTABLE RIGHT SHOULDER COMPARISON:  None. FINDINGS: No fracture or  malalignment. Calcification along the inferior glenohumeral joint. AC joint is intact IMPRESSION: 1. No acute osseous abnormality 2. Faint calcification along the inferior glenohumeral joint, questionable for spur or possible loose body. Electronically Signed   By: 12/18/2020 M.D.   On: 10/18/2020 00:42    ED COURSE and MDM  Nursing notes, initial and subsequent vitals signs, including pulse oximetry, reviewed and interpreted by myself.  Vitals:   10/18/20 0002 10/18/20 0004  BP: (!) 175/65   Pulse: 83   Resp: 18   Temp: 97.6 F (36.4 C)   TempSrc: Oral   SpO2: 94%   Weight:  (!) 170.1 kg  Height: 5\' 4"  (1.626 m)    Medications - No data to display  Presentation is consistent with rotator cuff syndrome.  We will treat with an NSAID and refer to orthopedics.  PROCEDURES  Procedures  ED DIAGNOSES     ICD-10-CM   1. Rotator cuff syndrome of right shoulder  M75.101        Alyshia Kernan, Jonny Ruiz, MD 10/18/20 [id number]

## 2020-10-18 NOTE — ED Triage Notes (Signed)
C/o covid + x 1 week  with right arm pain x 2 days , diarrhea x 3 days

## 2020-10-18 NOTE — ED Notes (Signed)
amb to room with 02 sat 91% , RT to bedside for eval  , after 2 min of sitting 0s sat 94, RT placed pt on 2 liters [employer], MD made aware

## 2020-10-19 ENCOUNTER — Ambulatory Visit (INDEPENDENT_AMBULATORY_CARE_PROVIDER_SITE_OTHER): Payer: BC Managed Care – PPO | Admitting: Orthopaedic Surgery

## 2020-10-19 ENCOUNTER — Encounter: Payer: Self-pay | Admitting: Orthopaedic Surgery

## 2020-10-19 ENCOUNTER — Telehealth: Payer: Self-pay | Admitting: Orthopaedic Surgery

## 2020-10-19 VITALS — BP 129/71 | HR 76 | Ht 64.0 in | Wt 375.0 lb

## 2020-10-19 DIAGNOSIS — M7541 Impingement syndrome of right shoulder: Secondary | ICD-10-CM | POA: Diagnosis not present

## 2020-10-19 MED ORDER — BUPIVACAINE HCL 0.25 % IJ SOLN
4.0000 mL | INTRAMUSCULAR | Status: AC | PRN
  Administered 2020-10-19: 4 mL via INTRA_ARTICULAR

## 2020-10-19 MED ORDER — LIDOCAINE HCL 1 % IJ SOLN
0.5000 mL | INTRAMUSCULAR | Status: AC | PRN
Start: 2020-10-19 — End: 2020-10-19
  Administered 2020-10-19: .5 mL

## 2020-10-19 MED ORDER — METHYLPREDNISOLONE ACETATE 40 MG/ML IJ SUSP
40.0000 mg | INTRAMUSCULAR | Status: AC | PRN
  Administered 2020-10-19: 40 mg via INTRA_ARTICULAR

## 2020-10-19 NOTE — Progress Notes (Addendum)
Office Visit Note   Patient: Elizabeth Huber           Date of Birth: 05/16/1961           MRN: [id number] Visit Date: 10/19/2020              Requested by: Elias Else, MD [phone] [address] [place],  [place] [address] PCP: Elias Else, MD   Assessment & Plan: Visit Diagnoses:  1. Impingement syndrome of right shoulder     Plan: Post subacromial injection she had some relief of pain but still could not abduct her arm more than 40 degrees.  She likely has rotator cuff tear.  Work slip given no work x1 week I will recheck her in 1 week.  If she is not better in 1 week then she will need an MRI scan of her right shoulder rule out rotator cuff tear since she is not able to abduct her arm.  If her shoulder is better moving better she feels like she can resume work at [employer] that we can give her work slip for work resumption.  We will also consider cervical spine x-rays when she turns if she continues to have neck symptoms.  Follow-Up Instructions: Return in about 1 week (around 10/26/2020).   Orders:  Orders Placed This Encounter  Procedures  . Large Joint Inj: R subacromial bursa   No orders of the defined types were placed in this encounter.     Procedures: Large Joint Inj: R subacromial bursa on 10/19/2020 2:38 PM Indications: pain Details: 22 G 1.5 in needle  Arthrogram: No  Medications: 4 mL bupivacaine 0.25 %; 40 mg methylPREDNISolone acetate 40 MG/ML; 0.5 mL lidocaine 1 % Outcome: tolerated well, no immediate complications Procedure, treatment alternatives, risks and benefits explained, specific risks discussed. Consent was given by the patient. Immediately prior to procedure a time out was called to verify the correct patient, procedure, equipment, support staff and site/side marked as required. Patient was prepped and draped in the usual sterile fashion.       Clinical Data: No additional findings.   Subjective: Chief Complaint  Patient presents with   . Right Shoulder - Pain    HPI 60 year old female here for right shoulder pain that began about 3 days ago.  She was seen at [facility]/17/2020 with inability to lift her arm sharp pain.  She tested positive for COVID 1 week ago she states she is feeling better not really having any residual from the COVID.  She was given meloxicam which did not help her shoulder.  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  She states prior to few days ago should get her arm up overhead had no pain and her shoulder was doing fine.  She has some pain in her neck occasionally she noted little tingling in her fingers but not consistent.  No history of rheumatologic conditions.  She does have increased BMI.  Review of Systems Negative for fever chills she does have diabetes type II on insulin and states her A1c is less than 7.  All other systems noncontributory.  Objective: Vital Signs: BP 129/71   Pulse 76   Ht 5\' 4"  (1.626 m)   Wt (!) 375 lb (170.1 kg)   LMP 05/05/2011   BMI 64.37 kg/m   Physical Exam Constitutional:  Appearance: She is well-developed.  HENT:     Head: Normocephalic.     Right Ear: External ear normal.     Left Ear: External ear normal.  Eyes:     Pupils: Pupils are equal, round, and reactive to light.  Neck:     Thyroid: No thyromegaly.     Trachea: No tracheal deviation.  Cardiovascular:     Rate and Rhythm: Normal rate.  Pulmonary:     Effort: Pulmonary effort is normal.  Abdominal:     Palpations: Abdomen is soft.  Skin:    General: Skin is warm and dry.  Neurological:     Mental Status: She is alert and oriented to person, place, and time.  Psychiatric:        Behavior: Behavior normal.     Ortho Exam patient only has 30 degrees abduction with sharp pain.  Positive drop arm test.  No internal rotation weakness.  She has some brachial plexus tenderness right  and left more tenderness in the right supraspinatus fossa.  Upper extremity reflexes are 2+ and symmetrical.  Interossei grip wrist extension is normal.  Specialty Comments:  No specialty comments available.  Imaging: No results found.   PMFS History: Patient Active Problem List   Diagnosis Date Noted  . Impingement syndrome of right shoulder 10/19/2020  . Chronic diastolic HF (heart failure) (HCC)   . Chest pain 08/19/2016  . SOB (shortness of breath) 08/19/2016  . Acute bronchitis 08/19/2016  . GERD (gastroesophageal reflux disease) 08/19/2016  . CAD (coronary artery disease) 40% LAD afte Dx1 08/05/11 08/06/2011  . PSVT (paroxysmal supraventricular tachycardia) (HCC) 08/06/2011  . Chest pain 08/04/2011  . Diabetes mellitus (HCC) 08/04/2011  . Hyperlipidemia,  08/04/2011  . Tachycardia, sinus tach on admission 08/04/2011  . Obesity 08/04/2011  . HTN (hypertension) 08/04/2011   Past Medical History:  Diagnosis Date  . Angina   . Asthma   . Chronic bronchitis    "have it whenever I have a cold"  . Diabetes mellitus type 2 in obese (HCC)   . Dysrhythmia 08/03/11   "palpitations"  . GERD (gastroesophageal reflux disease)   . Headache(784.0)   . Herpes simplex    "type 1 & 2"  . High cholesterol   . Hypertension   . Phlebitis   . Shortness of breath on exertion     Family History  Problem Relation Age of Onset  . Diabetes Mother   . Hypertension Mother   . CAD Father   . Diabetes Sister   . Healthy Brother   . Breast cancer Neg Hx     Past Surgical History:  Procedure Laterality Date  . CORONARY ANGIOGRAM N/A 08/05/2011   Procedure: CORONARY ANGIOGRAM;  Surgeon: Thurmon Fair, MD;  Location: [facility];  Service: Cardiovascular;  Laterality: N/A;  . SALPINGECTOMY     w/ovary removal; "right"  . SALPINGOOPHORECTOMY    . TOENAIL EXCISION     Social History   Occupational History  . Not on file  Tobacco Use  . Smoking status: Never Smoker  . Smokeless  tobacco: Never Used  Substance and Sexual Activity  . Alcohol use: No  . Drug use: No  . Sexual activity: Not Currently

## 2020-10-19 NOTE — Telephone Encounter (Signed)
Patient called wanting to know  the name of the injection she had today? The number to contact patient is [phone]

## 2020-10-19 NOTE — Telephone Encounter (Signed)
I called patient and advised. 

## 2020-10-22 ENCOUNTER — Telehealth: Payer: Self-pay | Admitting: Surgery

## 2020-10-22 NOTE — Telephone Encounter (Signed)
Spoke with patient advised she needed to pay $25.00 and complete the medical records release form    Patient said she will do this on Friday

## 2020-10-26 ENCOUNTER — Ambulatory Visit (INDEPENDENT_AMBULATORY_CARE_PROVIDER_SITE_OTHER): Payer: BC Managed Care – PPO | Admitting: Surgery

## 2020-10-26 ENCOUNTER — Encounter: Payer: Self-pay | Admitting: Surgery

## 2020-10-26 ENCOUNTER — Other Ambulatory Visit: Payer: Self-pay

## 2020-10-26 ENCOUNTER — Ambulatory Visit: Payer: BC Managed Care – PPO | Admitting: Surgery

## 2020-10-26 VITALS — Ht 64.0 in | Wt 375.0 lb

## 2020-10-26 DIAGNOSIS — M7541 Impingement syndrome of right shoulder: Secondary | ICD-10-CM

## 2020-10-26 NOTE — Progress Notes (Signed)
Office Visit Note   Patient: Elizabeth Huber           Date of Birth: [birth year]           MRN: [id number] Visit Date: 10/26/2020              Requested by: Elias Else, MD [phone] [address] [place],  [place] [address] PCP: Elias Else, MD   Assessment & Plan: Visit Diagnoses:  1. Impingement syndrome of right shoulder     Plan: Since patient continues have ongoing pain that is failed conservative treatment I will schedule MRI right shoulder to rule out rotator cuff tear and other shoulder pathology.  Follow-up with Dr. Ophelia Charter in 2 weeks for recheck to discuss results and treatment options.  Follow-Up Instructions: Return in about 2 weeks (around 11/09/2020) for with dr yates to review right shoulder mri.   Orders:  Orders Placed This Encounter  Procedures   MR SHOULDER RIGHT WO CONTRAST   No orders of the defined types were placed in this encounter.     Procedures: No procedures performed   Clinical Data: No additional findings.   Subjective: Chief Complaint  Patient presents with   Right Shoulder - Pain, Follow-up    HPI 60 year old white female with history of right shoulder pain returns.  States that previous subacromial injection that was given gave some temporary improvement.  She continues to have pain with overhead activity and reaching behind her back.  Pain when she lays on her right shoulder at night.  Not describing a cervical radicular component.    Objective: Vital Signs: Ht 5\' 4"  (1.626 m)   Wt (!) 375 lb (170.1 kg)   LMP 05/05/2011   BMI 64.37 kg/m   Physical Exam Exam right shoulder has some limitation in range of motion due to pain.  Markedly positive impingement test.  Negative drop arm.  Pain with resistance. Ortho Exam  Specialty Comments:  No specialty comments available.  Imaging: No results found.   PMFS History: Patient Active Problem List   Diagnosis Date Noted   Impingement syndrome of right shoulder 10/19/2020    Chronic diastolic HF (heart failure) (HCC)    Chest pain 08/19/2016   SOB (shortness of breath) 08/19/2016   Acute bronchitis 08/19/2016   GERD (gastroesophageal reflux disease) 08/19/2016   CAD (coronary artery disease) 40% LAD afte Dx1 08/05/11 08/06/2011   PSVT (paroxysmal supraventricular tachycardia) (HCC) 08/06/2011   Chest pain 08/04/2011   Diabetes mellitus (HCC) 08/04/2011   Hyperlipidemia,  08/04/2011   Tachycardia, sinus tach on admission 08/04/2011   Obesity 08/04/2011   HTN (hypertension) 08/04/2011   Past Medical History:  Diagnosis Date   Angina    Asthma    Chronic bronchitis    "have it whenever I have a cold"   Diabetes mellitus type 2 in obese [facility])    Dysrhythmia 08/03/11   "palpitations"   GERD (gastroesophageal reflux disease)    Headache(784.0)    Herpes simplex    "type 1 & 2"   High cholesterol    Hypertension    Phlebitis    Shortness of breath on exertion     Family History  Problem Relation Age of Onset   Diabetes Mother    Hypertension Mother    CAD Father    Diabetes Sister    Healthy Brother    Breast cancer Neg Hx     Past Surgical History:  Procedure Laterality Date   CORONARY ANGIOGRAM  N/A 08/05/2011   Procedure: CORONARY ANGIOGRAM;  Surgeon: Thurmon Fair, MD;  Location: [facility];  Service: Cardiovascular;  Laterality: N/A;   SALPINGECTOMY     w/ovary removal; "right"   SALPINGOOPHORECTOMY     TOENAIL EXCISION     Social History   Occupational History   Not on file  Tobacco Use   Smoking status: Never   Smokeless tobacco: Never  Substance and Sexual Activity   Alcohol use: No   Drug use: No   Sexual activity: Not Currently

## 2020-10-29 ENCOUNTER — Telehealth: Payer: Self-pay | Admitting: Radiology

## 2020-10-29 NOTE — Telephone Encounter (Signed)
Note entered keeping patient out of work until follow up in the office for MRI review per Dr. Ophelia Charter.

## 2020-10-29 NOTE — Telephone Encounter (Signed)
Patient came back in to see Fayrene Fearing for one week follow up. She continues to have difficulty with overhead motion and reaching behind her, as well as pain. Fayrene Fearing has entered order for MRI per your last office note. OK to continue out of work until follow up in the office to review MRI scan?

## 2020-11-03 ENCOUNTER — Ambulatory Visit
Admission: RE | Admit: 2020-11-03 | Discharge: 2020-11-03 | Disposition: A | Discharge status: Home / Self Care | Payer: BC Managed Care – PPO | Source: Ambulatory Visit | Attending: Surgery | Admitting: Surgery

## 2020-11-03 DIAGNOSIS — M7541 Impingement syndrome of right shoulder: Secondary | ICD-10-CM

## 2020-11-05 ENCOUNTER — Telehealth: Payer: Self-pay

## 2020-11-05 NOTE — Telephone Encounter (Signed)
Patient went to [facility] and was unable to do MRI. States she is claust and would like an Open Unit.    CB [phone]

## 2020-11-06 ENCOUNTER — Telehealth: Payer: Self-pay | Admitting: Orthopaedic Surgery

## 2020-11-06 NOTE — Telephone Encounter (Signed)
Pt called stating someone was supposed to be looking for a facility with an open MRI and she hasn't heard anything so she wanted a CB to be updated. Pt also mentioned that [facility] is needing an estimate of when she'll return to work.   [phone]

## 2020-11-07 NOTE — Telephone Encounter (Signed)
Can you advise on open MRI?

## 2020-11-08 ENCOUNTER — Telehealth: Payer: Self-pay

## 2020-11-08 NOTE — Telephone Encounter (Signed)
Patient called regarding the status of her mri and wanted to speak to betsy I advised the patient betsy is out of the office today but I can leave her message for when she returns patient is requesting a call back:[phone]

## 2020-11-08 NOTE — Telephone Encounter (Signed)
Pt called stating she has been trying to figure out what to do about having an MRI done and her being claustrophobic. Pt would like a CB just to get an estimate of how much longer she may have to wait; and even discuss any other options. Pt would like a CB as soon as possible since she's been waiting since Monday.  [phone]

## 2020-11-09 ENCOUNTER — Telehealth: Payer: Self-pay | Admitting: Orthopaedic Surgery

## 2020-11-09 DIAGNOSIS — M7541 Impingement syndrome of right shoulder: Secondary | ICD-10-CM

## 2020-11-09 NOTE — Telephone Encounter (Signed)
I called patient, phone rings and then rings busy. Per message in chart from earlier today, Elizabeth Huber has reached out to patient and sent order to [facility] to have scheduled.

## 2020-11-09 NOTE — Telephone Encounter (Signed)
Pt is aware sending to [facility], order was faxed to Lennox Laity and [place] at [place] scheduling, they will contact pt to schedule appt

## 2020-11-09 NOTE — Telephone Encounter (Signed)
PT called and she went to her MRI appt but couldn't get it done because she wasn't comfortable. She would like a nurse to call her.   CB [phone]

## 2020-11-09 NOTE — Telephone Encounter (Signed)
Order sent to [facility]

## 2020-11-12 ENCOUNTER — Telehealth: Payer: Self-pay | Admitting: Orthopaedic Surgery

## 2020-11-12 NOTE — Telephone Encounter (Signed)
I called patient. Duplicate message in chart. 

## 2020-11-12 NOTE — Telephone Encounter (Signed)
I called patient. She has been to [facility] and states that she cannot get comfortable in the magnet to get it done. She does not feel that medication will help her as her mind will still know that she has to get into the magnet. Is there something else that you recommend?

## 2020-11-12 NOTE — Telephone Encounter (Signed)
PT called about her her being too closterphobic for the MRI and wondering what you guys could od for her?   CB [phone]

## 2020-11-13 NOTE — Telephone Encounter (Signed)
I called patient and advised. She states that she cannot do the large, more open magnet at [facility] with medication and this one was better than the [facility] open magnet. She does not feel the medication will make a difference. Is there a different test that you would recommend?

## 2020-11-16 NOTE — Telephone Encounter (Signed)
CT arthrogram can be done. Order entered. I left detailed message for patient advising. Asked for her to call and make follow up appt for review once she knows date of scan.

## 2020-11-16 NOTE — Addendum Note (Signed)
Addended by: Rogers Seeds on: 11/16/2020 08:47 AM   Modules accepted: Orders

## 2020-11-20 ENCOUNTER — Telehealth: Payer: Self-pay

## 2020-11-20 NOTE — Telephone Encounter (Signed)
I called patient and advised order has been entered, however, it was over a holiday weekend and sometimes there is back log from that. I explained you are working diligently to get authorization for all studies ordered in the office. Could you let me know when you have update on patient and I will be glad to call and let her know. She has been out of work since before the MRI was ordered and was concerned for how long this may take.  Thank you.

## 2020-11-20 NOTE — Telephone Encounter (Signed)
Patient called regarding a ct scan she is requesting a call back today call back:[phone]

## 2020-11-27 ENCOUNTER — Telehealth: Payer: Self-pay | Admitting: Orthopaedic Surgery

## 2020-11-27 NOTE — Telephone Encounter (Signed)
Pt called to set an appt for CT scan review but Dr. Ophelia Charter appt is not available until 7/26 and pt doesn't want to wait until then. She is asking for Dr. Ophelia Charter to call with results after pt CT Scan which is schedule for tomorrow. Please call pt at [phone].

## 2020-11-28 ENCOUNTER — Ambulatory Visit
Admission: RE | Admit: 2020-11-28 | Discharge: 2020-11-28 | Disposition: A | Discharge status: Home / Self Care | Payer: BC Managed Care – PPO | Source: Ambulatory Visit | Attending: Orthopaedic Surgery | Admitting: Orthopaedic Surgery

## 2020-11-28 ENCOUNTER — Other Ambulatory Visit: Payer: BC Managed Care – PPO

## 2020-11-28 DIAGNOSIS — M7541 Impingement syndrome of right shoulder: Secondary | ICD-10-CM

## 2020-11-28 MED ORDER — IOPAMIDOL (ISOVUE-M 200) INJECTION 41%
15.0000 mL | Freq: Once | INTRAMUSCULAR | Status: AC
  Administered 2020-11-28: 15 mL via INTRA_ARTICULAR

## 2020-11-28 NOTE — Telephone Encounter (Signed)
Patient has CT arthrogram scheduled for 1420 today.  She is requesting return call with results. Please advise.

## 2020-11-29 ENCOUNTER — Telehealth: Payer: Self-pay | Admitting: Orthopaedic Surgery

## 2020-11-29 NOTE — Telephone Encounter (Signed)
Pt just called and said she just got off the phone with yates but would like him to call her back   CB [phone]

## 2020-12-04 ENCOUNTER — Ambulatory Visit (INDEPENDENT_AMBULATORY_CARE_PROVIDER_SITE_OTHER): Payer: BC Managed Care – PPO | Admitting: Orthopaedic Surgery

## 2020-12-04 ENCOUNTER — Encounter: Payer: Self-pay | Admitting: Orthopaedic Surgery

## 2020-12-04 ENCOUNTER — Other Ambulatory Visit: Payer: Self-pay

## 2020-12-04 VITALS — Ht 64.0 in | Wt 375.0 lb

## 2020-12-04 DIAGNOSIS — M7541 Impingement syndrome of right shoulder: Secondary | ICD-10-CM

## 2020-12-04 NOTE — Progress Notes (Signed)
Office Visit Note   Patient: Elizabeth Huber           Date of Birth: 02-21-1961           MRN: [id number] Visit Date: 12/04/2020              Requested by: Elias Else, MD [phone] [place],  [place] [address] PCP: Elias Else, MD   Assessment & Plan: Visit Diagnoses:  1. Impingement syndrome of right shoulder     Plan: She can check with work to see if they have some modified duty available.  We discussed shoulder arthroscopy and biceps tendon debridement and release as well as rotator cuff debridement.  She would need to have medical and cardiac clearance with her extensive medical history.  We discussed surgical option treatments available.  Follow-Up Instructions: Return in about 6 weeks (around 01/15/2021).   Orders:  No orders of the defined types were placed in this encounter.  No orders of the defined types were placed in this encounter.     Procedures: No procedures performed   Clinical Data: No additional findings.   Subjective: Chief Complaint  Patient presents with   Right Shoulder - Follow-up, Pain    CT Arthrogram right shoulder review    HPI 60 year old female returns for follow-up with chronic ongoing problems with right shoulder pain.  Arthrogram CT scan shoulder demonstrated attenuation of the long head of the biceps tendon and undersurface tearing of the lateral aspect of the supraspinatus.  Patient continues to have pain difficulty with outstretched overhead reaching.  When she rolls over on her shoulder it wakes her up at night.  She states she has pain if her arm hangs down.  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  Review of Systems positive for obesity hypertension hyperlipidemia diabetes chronic heart failure.  Coronary artery disease.   Objective: Vital Signs: Ht 5\' 4"  (1.626 m)   Wt (!) 375 lb (170.1 kg)   LMP 05/05/2011    BMI 64.37 kg/m   Physical Exam Constitutional:      Appearance: She is well-developed.  HENT:     Head: Normocephalic.     Right Ear: External ear normal.     Left Ear: External ear normal. There is no impacted cerumen.  Eyes:     Pupils: Pupils are equal, round, and reactive to light.  Neck:     Thyroid: No thyromegaly.     Trachea: No tracheal deviation.  Cardiovascular:     Rate and Rhythm: Normal rate.  Pulmonary:     Effort: Pulmonary effort is normal.  Abdominal:     Palpations: Abdomen is soft.  Musculoskeletal:     Cervical back: No rigidity.  Skin:    General: Skin is warm and dry.  Neurological:     Mental Status: She is alert and oriented to person, place, and time.  Psychiatric:        Behavior: Behavior normal.    Ortho Exam Limited abduction 45 degrees positive drop arm test.  No internal or external rotation weakness.  Long head of biceps is tender.  Reflexes are intact good cervical range of motion. Specialty Comments:  No specialty comments available.  Imaging: No results found.   PMFS History: Patient Active Problem List   Diagnosis Date Noted   Impingement syndrome of right shoulder  10/19/2020   Chronic diastolic HF (heart failure) (HCC)    Chest pain 08/19/2016   SOB (shortness of breath) 08/19/2016   Acute bronchitis 08/19/2016   GERD (gastroesophageal reflux disease) 08/19/2016   CAD (coronary artery disease) 40% LAD afte Dx1 08/05/11 08/06/2011   PSVT (paroxysmal supraventricular tachycardia) (HCC) 08/06/2011   Chest pain 08/04/2011   Diabetes mellitus (HCC) 08/04/2011   Hyperlipidemia,  08/04/2011   Tachycardia, sinus tach on admission 08/04/2011   Obesity 08/04/2011   HTN (hypertension) 08/04/2011   Past Medical History:  Diagnosis Date   Angina    Asthma    Chronic bronchitis    "have it whenever I have a cold"   Diabetes mellitus type 2 in obese [facility])    Dysrhythmia 08/03/11   "palpitations"   GERD (gastroesophageal reflux  disease)    Headache(784.0)    Herpes simplex    "type 1 & 2"   High cholesterol    Hypertension    Phlebitis    Shortness of breath on exertion     Family History  Problem Relation Age of Onset   Diabetes Mother    Hypertension Mother    CAD Father    Diabetes Sister    Healthy Brother    Breast cancer Neg Hx     Past Surgical History:  Procedure Laterality Date   CORONARY ANGIOGRAM N/A 08/05/2011   Procedure: CORONARY ANGIOGRAM;  Surgeon: Thurmon Fair, MD;  Location: [facility] CATH LAB;  Service: Cardiovascular;  Laterality: N/A;   SALPINGECTOMY     w/ovary removal; "right"   SALPINGOOPHORECTOMY     TOENAIL EXCISION     Social History   Occupational History   Not on file  Tobacco Use   Smoking status: Never   Smokeless tobacco: Never  Substance and Sexual Activity   Alcohol use: No   Drug use: No   Sexual activity: Not Currently

## 2020-12-13 ENCOUNTER — Encounter: Payer: Self-pay | Admitting: Orthopaedic Surgery

## 2021-07-06 ENCOUNTER — Emergency Department (HOSPITAL_COMMUNITY): Payer: BC Managed Care – PPO

## 2021-07-06 ENCOUNTER — Encounter (HOSPITAL_COMMUNITY): Payer: Self-pay | Admitting: Emergency Medicine

## 2021-07-06 ENCOUNTER — Emergency Department (HOSPITAL_COMMUNITY)
Admission: EM | Admit: 2021-07-06 | Discharge: 2021-07-06 | Disposition: A | Discharge status: Home / Self Care | Payer: BC Managed Care – PPO | Attending: Emergency Medicine | Admitting: Emergency Medicine

## 2021-07-06 DIAGNOSIS — R112 Nausea with vomiting, unspecified: Secondary | ICD-10-CM | POA: Diagnosis present

## 2021-07-06 DIAGNOSIS — Z7982 Long term (current) use of aspirin: Secondary | ICD-10-CM | POA: Insufficient documentation

## 2021-07-06 DIAGNOSIS — Z794 Long term (current) use of insulin: Secondary | ICD-10-CM | POA: Insufficient documentation

## 2021-07-06 DIAGNOSIS — E1165 Type 2 diabetes mellitus with hyperglycemia: Secondary | ICD-10-CM | POA: Insufficient documentation

## 2021-07-06 DIAGNOSIS — J45909 Unspecified asthma, uncomplicated: Secondary | ICD-10-CM | POA: Diagnosis not present

## 2021-07-06 DIAGNOSIS — D72829 Elevated white blood cell count, unspecified: Secondary | ICD-10-CM | POA: Diagnosis not present

## 2021-07-06 DIAGNOSIS — R059 Cough, unspecified: Secondary | ICD-10-CM | POA: Diagnosis not present

## 2021-07-06 DIAGNOSIS — N611 Abscess of the breast and nipple: Secondary | ICD-10-CM | POA: Diagnosis not present

## 2021-07-06 DIAGNOSIS — R197 Diarrhea, unspecified: Secondary | ICD-10-CM | POA: Insufficient documentation

## 2021-07-06 DIAGNOSIS — I110 Hypertensive heart disease with heart failure: Secondary | ICD-10-CM | POA: Diagnosis not present

## 2021-07-06 DIAGNOSIS — Z79899 Other long term (current) drug therapy: Secondary | ICD-10-CM | POA: Insufficient documentation

## 2021-07-06 DIAGNOSIS — Z20822 Contact with and (suspected) exposure to covid-19: Secondary | ICD-10-CM | POA: Insufficient documentation

## 2021-07-06 DIAGNOSIS — R748 Abnormal levels of other serum enzymes: Secondary | ICD-10-CM | POA: Insufficient documentation

## 2021-07-06 DIAGNOSIS — I5032 Chronic diastolic (congestive) heart failure: Secondary | ICD-10-CM | POA: Diagnosis not present

## 2021-07-06 DIAGNOSIS — R1084 Generalized abdominal pain: Secondary | ICD-10-CM | POA: Diagnosis not present

## 2021-07-06 LAB — CBC
HCT: 43.7 % (ref 36.0–46.0)
Hemoglobin: 14.2 g/dL (ref 12.0–15.0)
MCH: 27.8 pg (ref 26.0–34.0)
MCHC: 32.5 g/dL (ref 30.0–36.0)
MCV: 85.7 fL (ref 80.0–100.0)
Platelets: 328 10*3/uL (ref 150–400)
RBC: 5.1 MIL/uL (ref 3.87–5.11)
RDW: 13.4 % (ref 11.5–15.5)
WBC: 12.7 10*3/uL — ABNORMAL HIGH (ref 4.0–10.5)
nRBC: 0 % (ref 0.0–0.2)

## 2021-07-06 LAB — COMPREHENSIVE METABOLIC PANEL
ALT: 17 U/L (ref 0–44)
AST: 20 U/L (ref 15–41)
Albumin: 3.6 g/dL (ref 3.5–5.0)
Alkaline Phosphatase: 140 U/L — ABNORMAL HIGH (ref 38–126)
Anion gap: 10 (ref 5–15)
BUN: 21 mg/dL — ABNORMAL HIGH (ref 6–20)
CO2: 26 mmol/L (ref 22–32)
Calcium: 8.8 mg/dL — ABNORMAL LOW (ref 8.9–10.3)
Chloride: 99 mmol/L (ref 98–111)
Creatinine, Ser: 0.71 mg/dL (ref 0.44–1.00)
GFR, Estimated: >60 mL/min (ref >60)
Glucose, Bld: 178 mg/dL — ABNORMAL HIGH (ref 70–99)
Potassium: 4.3 mmol/L (ref 3.5–5.1)
Sodium: 135 mmol/L (ref 135–145)
Total Bilirubin: 0.7 mg/dL (ref 0.3–1.2)
Total Protein: 7.5 g/dL (ref 6.5–8.1)

## 2021-07-06 LAB — RESP PANEL BY RT-PCR (FLU A&B, COVID) ARPGX2
Influenza A by PCR: NEGATIVE
Influenza B by PCR: NEGATIVE
SARS Coronavirus 2 by RT PCR: NEGATIVE

## 2021-07-06 LAB — URINALYSIS, ROUTINE W REFLEX MICROSCOPIC
Bilirubin Urine: NEGATIVE
Glucose, UA: NEGATIVE mg/dL
Hgb urine dipstick: NEGATIVE
Ketones, ur: NEGATIVE mg/dL
Leukocytes,Ua: NEGATIVE
Nitrite: NEGATIVE
Protein, ur: NEGATIVE mg/dL
Specific Gravity, Urine: >1.046 — ABNORMAL HIGH (ref 1.005–1.030)
pH: 5 (ref 5.0–8.0)

## 2021-07-06 LAB — LIPASE, BLOOD: Lipase: 22 U/L (ref 11–51)

## 2021-07-06 MED ORDER — MORPHINE SULFATE (PF) 4 MG/ML IV SOLN
4.0000 mg | Freq: Once | INTRAVENOUS | Status: AC
  Administered 2021-07-06: 4 mg via INTRAVENOUS
  Filled 2021-07-06: qty 1

## 2021-07-06 MED ORDER — LACTATED RINGERS IV BOLUS
500.0000 mL | Freq: Once | INTRAVENOUS | Status: AC
  Administered 2021-07-06: 500 mL via INTRAVENOUS

## 2021-07-06 MED ORDER — IOHEXOL 300 MG/ML  SOLN
100.0000 mL | Freq: Once | INTRAMUSCULAR | Status: AC | PRN
  Administered 2021-07-06: 100 mL via INTRAVENOUS

## 2021-07-06 MED ORDER — LORAZEPAM 2 MG/ML IJ SOLN
1.0000 mg | Freq: Once | INTRAMUSCULAR | Status: AC
  Administered 2021-07-06: 1 mg via INTRAVENOUS
  Filled 2021-07-06: qty 1

## 2021-07-06 MED ORDER — ONDANSETRON HCL 4 MG/2ML IJ SOLN
4.0000 mg | Freq: Once | INTRAMUSCULAR | Status: AC
  Administered 2021-07-06: 4 mg via INTRAVENOUS
  Filled 2021-07-06: qty 2

## 2021-07-06 MED ORDER — ONDANSETRON 4 MG PO TBDP: 4.0000 mg | ORAL_TABLET | Freq: Three times a day (TID) | ORAL | 0 refills | Status: DC | PRN

## 2021-07-06 MED ORDER — SODIUM CHLORIDE (PF) 0.9 % IJ SOLN
INTRAMUSCULAR | Status: AC
  Filled 2021-07-06: qty 50

## 2021-07-06 NOTE — ED Triage Notes (Addendum)
Pt reports chills, nausea, and body aches that started around 9p. EMS states that they were out there for the same about a week ago. Patient has only tried pepto and states that that makes it worse. Also endorses generalized abdominal pain. Asked patient how many episodes of diarrhea she has had, she states "I have filled up a trash can with diarrhea" and pointed to the trash can in the room.

## 2021-07-06 NOTE — ED Notes (Signed)
Pt noted to be having decreased O2 sats when falling asleep. Pt denies hx of sleep apnea. Placed on 2L [place], when pt is awake O2 sats remain 99%. When she falls asleep they drop in the the 70s  Pt denies feeling short of breath and denies feeling lightheaded.

## 2021-07-06 NOTE — Discharge Instructions (Signed)
You came to the emergency department today to be evaluated for your nausea, vomiting, and diarrhea.  The CT scan of your abdomen pelvis showed no acute abnormalities.  Your chest x-ray showed no signs of pneumonia.  Your lab work was reassuring.  Your symptoms improved after receiving medications in the emergency department.  I have given you prescription for antinausea medicine, you may take 1 pill every 8 hours as needed for nausea and vomiting.  Please follow up with your primary care provider for repeat evaluation.  Your COVID-19 and flu test is pending at this time.  Please logon to your [facility] MyChart to review your test results.  If positive for COVID-19 or influenza you need to self isolate at home for 5 days.  Due to your breast abscess I have given you an ambulatory referral to the breast clinic.  They will call you to schedule a follow-up appointment in the outpatient setting.  Get help right away if: You have pain in your chest, neck, arm, or jaw. You feel extremely weak or you faint. You have persistent vomiting. You have vomit that is bright red or looks like black coffee grounds. You have bloody or black stools (feces) or stools that look like tar. You have a severe headache, a stiff neck, or both. You have severe pain, cramping, or bloating in your abdomen. You have difficulty breathing, or you are breathing very quickly. Your heart is beating very quickly. Your skin feels cold and clammy. You feel confused. You have signs of dehydration, such as: Dark urine, very little urine, or no urine. Cracked lips. Dry mouth. Sunken eyes. Sleepiness. Weakness.

## 2021-07-06 NOTE — ED Notes (Signed)
Father Cristy Friedlander with an update on the pt states and plan of care.

## 2021-07-06 NOTE — ED Provider Notes (Addendum)
[facility] DEPT Provider Note   CSN: [id number] Arrival date & time: 07/06/21  0516     History  Chief Complaint  Patient presents with   Nausea   Emesis    Elizabeth BROKAW is a 61 y.o. female with past medical history of type 2 diabetes, hypertension, chronic diastolic heart failure, asthma, GERD, constipation, genital herpes.  Presents to the emergency department with a chief complaint of abdominal pain and diarrhea.  Patient reports that her diarrhea started yesterday.  States that she has had 10 bowel movements since then.  Patient describes stool is light brown with no bleeding or melena.  Patient also endorses nausea and vomiting.  States she has vomited 5 times in the last 24 hours.  Describes emesis as stomach contents.  Emesis is nonbloody and nonbilious.  Patient has generalized abdominal pain.  Patient describes pain as cramping.  Rates pain 10/10 on the pain scale.  Patient states that pain is worse when standing states that "feels like something heavy is pulling my stomach down."  Patient also endorses bloating.  Patient has had minimally productive cough over the last "couple days."  Describes mucus is white.  Denies any recent recent antibiotic use, travel, or camping.  Denies any fevers, chills, dysuria, hematuria, urinary urgency, vaginal pain, vaginal bleeding, vaginal discharge.   Emesis Associated symptoms: abdominal pain, cough and diarrhea   Associated symptoms: no chills, no fever and no headaches       Home Medications Prior to Admission medications   Medication Sig Start Date End Date Taking? Authorizing Provider  acetaminophen (TYLENOL) 500 MG tablet Take 500 mg by mouth every 6 (six) hours as needed for moderate pain or headache.     [provider]  aspirin EC 81 MG tablet Take 162 mg by mouth 2 (two) times daily as needed for mild pain.    [provider]  atenolol (TENORMIN) 50 MG tablet Take 50 mg by  mouth daily.    [provider]  atorvastatin (LIPITOR) 10 MG tablet Take 10 mg by mouth every Monday, Wednesday, and Friday.  01/30/17   [provider]  furosemide (LASIX) 40 MG tablet Take 1 tablet (40 mg total) by mouth daily. 08/22/16   Barton Dubois, MD  guaifenesin (ROBITUSSIN) 100 MG/5ML syrup Take 400 mg by mouth 3 (three) times daily as needed for cough or congestion.    [provider]  ibuprofen (ADVIL) 800 MG tablet Take 800 mg by mouth every 8 (eight) hours as needed. 10/26/20   [provider]  insulin NPH-regular (NOVOLIN 70/30) (70-30) 100 UNIT/ML injection Inject 80 Units into the skin 2 (two) times daily with a meal.     [provider]  loratadine (CLARITIN) 10 MG tablet Take 1 tablet (10 mg total) by mouth daily. 08/22/16   Barton Dubois, MD  losartan (COZAAR) 25 MG tablet Take 25 mg by mouth daily.    [provider]  meloxicam (MOBIC) 15 MG tablet Take 1 tablet daily for shoulder pain. 10/18/20   Molpus, John, MD  Multiple Vitamins-Minerals (CENTRUM PO) Take 1 tablet by mouth daily.     [provider]  omeprazole (PRILOSEC) 20 MG capsule Take 20 mg by mouth 2 (two) times daily before a meal.    [provider]      Allergies    Hydrocodone, Oxycodone, Morphine and related, Tradjenta [linagliptin], and Vicodin [hydrocodone-acetaminophen]    Review of Systems   Review of Systems  Constitutional:  Negative for chills and fever.  Eyes:  Negative for visual disturbance.  Respiratory:  Positive for cough. Negative for shortness of breath.   Cardiovascular:  Negative for chest pain.  Gastrointestinal:  Positive for abdominal distention, abdominal pain, diarrhea, nausea and vomiting. Negative for anal bleeding, blood in stool, constipation and rectal pain.  Genitourinary:  Negative for difficulty urinating, dysuria, frequency, hematuria, urgency, vaginal bleeding, vaginal discharge and vaginal pain.   Musculoskeletal:  Negative for back pain and neck pain.  Skin:  Negative for color change and rash.  Neurological:  Negative for dizziness, syncope, light-headedness and headaches.  Psychiatric/Behavioral:  Negative for confusion.    Physical Exam Updated Vital Signs BP (!) 177/75 (BP Location: Right Arm)    Pulse 87    Temp 97.7 F (36.5 C) (Oral) Comment: $RemoveBeforeD'900mg'EIXevbufVmNyit$  ibprofen & $RemoveBe'500mg'RGIlbaVAN$  tylenol @ 23:00   Resp 18    Ht $R'5\' 4"'oy$  (1.626 m)    Wt (!) 170.1 kg    LMP 05/05/2011    SpO2 95%    BMI 64.37 kg/m  Physical Exam Vitals and nursing note reviewed.  Constitutional:      General: She is not in acute distress.    Appearance: She is morbidly obese. She is not ill-appearing, toxic-appearing or diaphoretic.  HENT:     Head: Normocephalic.  Eyes:     General: No scleral icterus.       Right eye: No discharge.        Left eye: No discharge.  Cardiovascular:     Rate and Rhythm: Normal rate.  Pulmonary:     Effort: Pulmonary effort is normal. No tachypnea or bradypnea.     Breath sounds: Normal breath sounds. No stridor.  Abdominal:     General: Abdomen is protuberant. Bowel sounds are normal. There is no distension. There are no signs of injury.     Palpations: Abdomen is soft. There is no mass or pulsatile mass.     Tenderness: There is generalized abdominal tenderness. There is no guarding or rebound.     Comments: Exam is limited by patient's body habitus  Skin:    General: Skin is warm and dry.  Neurological:     General: No focal deficit present.     Mental Status: She is alert.  Psychiatric:        Behavior: Behavior is cooperative.    ED Results / Procedures / Treatments   Labs (all labs ordered are listed, but only abnormal results are displayed) Labs Reviewed  COMPREHENSIVE METABOLIC PANEL - Abnormal; Notable for the following components:      Result Value   Glucose, Bld 178 (*)    BUN 21 (*)    Calcium 8.8 (*)    Alkaline Phosphatase 140 (*)    All other components  within normal limits  CBC - Abnormal; Notable for the following components:   WBC 12.7 (*)    All other components within normal limits  URINALYSIS, ROUTINE W REFLEX MICROSCOPIC - Abnormal; Notable for the following components:   Specific Gravity, Urine >1.046 (*)    All other components within normal limits  RESP PANEL BY RT-PCR (FLU A&B, COVID) ARPGX2  C DIFFICILE QUICK SCREEN W PCR REFLEX    GASTROINTESTINAL PANEL BY PCR, STOOL (REPLACES STOOL CULTURE)  LIPASE, BLOOD    EKG None  Radiology CT ABDOMEN PELVIS W CONTRAST  Result Date: 07/06/2021 CLINICAL DATA:  Abdominal pain EXAM: CT ABDOMEN AND PELVIS WITH CONTRAST TECHNIQUE: Multidetector CT imaging  of the abdomen and pelvis was performed using the standard protocol following bolus administration of intravenous contrast. RADIATION DOSE REDUCTION: This exam was performed according to the departmental dose-optimization program which includes automated exposure control, adjustment of the mA and/or kV according to patient size and/or use of iterative reconstruction technique. CONTRAST:  142mL OMNIPAQUE IOHEXOL 300 MG/ML  SOLN COMPARISON:  11/04/2018 FINDINGS: Lower chest: No acute abnormality. Hepatobiliary: No focal liver abnormality is seen. No gallstones, gallbladder wall thickening, or biliary dilatation. Pancreas: Unremarkable. No pancreatic ductal dilatation or surrounding inflammatory changes. Spleen: Normal in size without focal abnormality. Adrenals/Urinary Tract: Normal adrenal glands. No nephrolithiasis, hydronephrosis, or mass. No hydroureter or ureteral lithiasis identified. The urinary bladder appears normal. Stomach/Bowel: Stomach appears normal. There is no bowel wall thickening, inflammation, or distension. The appendix is visualized and appears normal. Vascular/Lymphatic: Aortic atherosclerosis. No aneurysm. No abdominopelvic adenopathy. Reproductive: IUD is noted within the endometrial cavity. No adnexal mass. Other: No free  fluid or fluid collections. Musculoskeletal: Bilateral facet arthropathy noted within the lower lumbar spine. Vertebral body heights and disc spaces are well preserved. IMPRESSION: 1. No acute findings within the abdomen or pelvis. 2. Aortic Atherosclerosis (ICD10-I70.0). Electronically Signed   By: Kerby Moors M.D.   On: 07/06/2021 09:56   DG Chest Portable 1 View  Result Date: 07/06/2021 CLINICAL DATA:  61 year old female with cough. Chills and body aches. Abdominal pain and shortness of breath. EXAM: PORTABLE CHEST 1 VIEW COMPARISON:  Portable chest 10/18/2020 and earlier. FINDINGS: Portable AP semi upright view at 0721 hours. Lung volumes and mediastinal contours are within normal limits. Visualized tracheal air column is within normal limits. Allowing for portable technique the lungs are clear. No pneumothorax. No acute osseous abnormality identified. IMPRESSION: Negative portable chest. Electronically Signed   By: Genevie Ann M.D.   On: 07/06/2021 07:39    Procedures Procedures    Medications Ordered in ED Medications  sodium chloride (PF) 0.9 % injection (has no administration in time range)  morphine (PF) 4 MG/ML injection 4 mg (4 mg Intravenous Given 07/06/21 0734)  ondansetron (ZOFRAN) injection 4 mg (4 mg Intravenous Given 07/06/21 0733)  iohexol (OMNIPAQUE) 300 MG/ML solution 100 mL (100 mLs Intravenous Contrast Given 07/06/21 0931)  lactated ringers bolus 500 mL (0 mLs Intravenous Stopped 07/06/21 1352)  LORazepam (ATIVAN) injection 1 mg (1 mg Intravenous Given 07/06/21 9163)    ED Course/ Medical Decision Making/ A&P                           Medical Decision Making Amount and/or Complexity of Data Reviewed Labs: ordered. Radiology: ordered.  Risk Prescription drug management.   Alert 61 year old female in no acute distress, nontoxic-appearing.  Presents to the emergency department with a complaint of diarrhea, nausea, vomiting, and abdominal pain.  Information was obtained  from patient.  Past medical records were reviewed including previous provider notes, labs, and imaging.  Patient has past medical history as noted in HPI which complicates care.  Patient has not had any recent antibiotics low suspicion for C. difficile however will obtain C. difficile testing as well as GI stool panel.  Due to reports of diarrhea with nausea and vomiting with previous history of abdominal surgery concern for possible small bowel obstruction.  Will obtain CT abdomen pelvis for further evaluation.  Patient ordered pain medication and Zofran to help with her pain and nausea.  Labs were independently reviewed myself.  Pertinent findings include: -Leukocytosis 12.7 -  Glucose 170, no signs of DKA -Alk phos 140, appears to be patient's baseline -Lipase within normal limits -UA shows no signs of infection  Chest x-ray was ordered and independently reviewed myself, shows no signs of acute cardiopulmonary abnormality.  CT abdomen pelvis shows no acute findings within the abdomen or pelvis.  After receiving morphine patient reports resolution of her pain.  Patient has resolution of nausea and vomiting after receiving Zofran; no episodes of vomiting in the emergency department.  Able to handle p.o. intake without difficulty.  Patient is unable to give a stool sample while in the emergency department.  Low suspicion for C. difficile at this time.  Suspect that patient's symptoms are due to viral gastroenteritis.  Patient hemodynamically stable.  Will prescribe patient with Zofran to help with her nausea and vomiting.  Patient to follow-up with her primary care provider.  Respiratory panel pending at this time.  Patient declines any antiviral therapy.  Will discharge to follow results [place].  Isolation precautions discussed with patient.  While in the emergency department patient told nurse that she had an abscess to her right spontaneously ruptured while in the emergency  department.  I evaluated the patient's breast with female nurse tech present in the room.  Patient has small wound to right breast at 7 o'clock position.  No surrounding erythema, induration, or fluctuance.  No purulent discharge noted around wound.  Antibiotics were considered however with no erythema suspicion for cellulitis at this time.  We will have patient follow-up with breast clinic for further evaluation.  Discussed results, findings, treatment and follow up. Patient advised of return precautions. Patient verbalized understanding and agreed with plan.           Final Clinical Impression(s) / ED Diagnoses Final diagnoses:  None    Rx / DC Orders ED Discharge Orders     None         Loni Beckwith, PA-C 07/06/21 1749    Loni Beckwith, PA-C 07/07/21 5427    Regan Lemming, MD 07/07/21 [id number]

## 2023-01-29 ENCOUNTER — Encounter: Payer: Self-pay | Admitting: Internal Medicine

## 2023-01-30 ENCOUNTER — Emergency Department (HOSPITAL_BASED_OUTPATIENT_CLINIC_OR_DEPARTMENT_OTHER): Payer: Commercial Managed Care - HMO

## 2023-01-30 ENCOUNTER — Other Ambulatory Visit: Payer: Self-pay

## 2023-01-30 ENCOUNTER — Emergency Department (HOSPITAL_BASED_OUTPATIENT_CLINIC_OR_DEPARTMENT_OTHER)
Admission: EM | Admit: 2023-01-30 | Discharge: 2023-01-30 | Disposition: A | Discharge status: Home / Self Care | Payer: Commercial Managed Care - HMO | Attending: Emergency Medicine | Admitting: Emergency Medicine

## 2023-01-30 ENCOUNTER — Encounter (HOSPITAL_BASED_OUTPATIENT_CLINIC_OR_DEPARTMENT_OTHER): Payer: Self-pay | Admitting: Emergency Medicine

## 2023-01-30 DIAGNOSIS — M545 Low back pain, unspecified: Secondary | ICD-10-CM | POA: Diagnosis present

## 2023-01-30 DIAGNOSIS — Z7982 Long term (current) use of aspirin: Secondary | ICD-10-CM | POA: Insufficient documentation

## 2023-01-30 DIAGNOSIS — Z794 Long term (current) use of insulin: Secondary | ICD-10-CM | POA: Insufficient documentation

## 2023-01-30 DIAGNOSIS — I509 Heart failure, unspecified: Secondary | ICD-10-CM | POA: Insufficient documentation

## 2023-01-30 DIAGNOSIS — M5440 Lumbago with sciatica, unspecified side: Secondary | ICD-10-CM | POA: Diagnosis not present

## 2023-01-30 DIAGNOSIS — E119 Type 2 diabetes mellitus without complications: Secondary | ICD-10-CM | POA: Insufficient documentation

## 2023-01-30 DIAGNOSIS — Z79899 Other long term (current) drug therapy: Secondary | ICD-10-CM | POA: Diagnosis not present

## 2023-01-30 DIAGNOSIS — I110 Hypertensive heart disease with heart failure: Secondary | ICD-10-CM | POA: Insufficient documentation

## 2023-01-30 LAB — URINALYSIS, ROUTINE W REFLEX MICROSCOPIC
Bilirubin Urine: NEGATIVE
Glucose, UA: NEGATIVE mg/dL
Hgb urine dipstick: NEGATIVE
Ketones, ur: NEGATIVE mg/dL
Leukocytes,Ua: NEGATIVE
Nitrite: NEGATIVE
Protein, ur: NEGATIVE mg/dL
Specific Gravity, Urine: 1.02 (ref 1.005–1.030)
pH: 5.5 (ref 5.0–8.0)

## 2023-01-30 LAB — CBC WITH DIFFERENTIAL/PLATELET
Abs Immature Granulocytes: 0.03 10*3/uL (ref 0.00–0.07)
Basophils Absolute: 0.1 10*3/uL (ref 0.0–0.1)
Basophils Relative: 0 %
Eosinophils Absolute: 0.2 10*3/uL (ref 0.0–0.5)
Eosinophils Relative: 1 %
HCT: 39.8 % (ref 36.0–46.0)
Hemoglobin: 13.4 g/dL (ref 12.0–15.0)
Immature Granulocytes: 0 %
Lymphocytes Relative: 20 %
Lymphs Abs: 2.3 10*3/uL (ref 0.7–4.0)
MCH: 28.5 pg (ref 26.0–34.0)
MCHC: 33.7 g/dL (ref 30.0–36.0)
MCV: 84.7 fL (ref 80.0–100.0)
Monocytes Absolute: 0.8 10*3/uL (ref 0.1–1.0)
Monocytes Relative: 7 %
Neutro Abs: 8.3 10*3/uL — ABNORMAL HIGH (ref 1.7–7.7)
Neutrophils Relative %: 72 %
Platelets: 353 10*3/uL (ref 150–400)
RBC: 4.7 MIL/uL (ref 3.87–5.11)
RDW: 12.4 % (ref 11.5–15.5)
WBC: 11.6 10*3/uL — ABNORMAL HIGH (ref 4.0–10.5)
nRBC: 0 % (ref 0.0–0.2)

## 2023-01-30 LAB — COMPREHENSIVE METABOLIC PANEL
ALT: 15 U/L (ref 0–44)
AST: 18 U/L (ref 15–41)
Albumin: 3.5 g/dL (ref 3.5–5.0)
Alkaline Phosphatase: 121 U/L (ref 38–126)
Anion gap: 12 (ref 5–15)
BUN: 26 mg/dL — ABNORMAL HIGH (ref 8–23)
CO2: 27 mmol/L (ref 22–32)
Calcium: 9 mg/dL (ref 8.9–10.3)
Chloride: 97 mmol/L — ABNORMAL LOW (ref 98–111)
Creatinine, Ser: 0.93 mg/dL (ref 0.44–1.00)
GFR, Estimated: >60 mL/min (ref >60)
Glucose, Bld: 136 mg/dL — ABNORMAL HIGH (ref 70–99)
Potassium: 4 mmol/L (ref 3.5–5.1)
Sodium: 136 mmol/L (ref 135–145)
Total Bilirubin: 0.9 mg/dL (ref 0.3–1.2)
Total Protein: 7.5 g/dL (ref 6.5–8.1)

## 2023-01-30 LAB — TROPONIN I (HIGH SENSITIVITY): Troponin I (High Sensitivity): 2 ng/L (ref <18)

## 2023-01-30 LAB — LIPASE, BLOOD: Lipase: 27 U/L (ref 11–51)

## 2023-01-30 LAB — D-DIMER, QUANTITATIVE: D-Dimer, Quant: 0.31 ug{FEU}/mL (ref 0.00–0.50)

## 2023-01-30 LAB — CBG MONITORING, ED: Glucose-Capillary: 142 mg/dL — ABNORMAL HIGH (ref 70–99)

## 2023-01-30 MED ORDER — METHYLPREDNISOLONE 4 MG PO TBPK: ORAL_TABLET | ORAL | 0 refills | Status: DC

## 2023-01-30 MED ORDER — METHOCARBAMOL 500 MG PO TABS: 500.0000 mg | ORAL_TABLET | Freq: Three times a day (TID) | ORAL | 0 refills | Status: DC | PRN

## 2023-01-30 MED ORDER — FENTANYL CITRATE PF 50 MCG/ML IJ SOSY
50.0000 ug | PREFILLED_SYRINGE | Freq: Once | INTRAMUSCULAR | Status: AC
  Administered 2023-01-30: 50 ug via INTRAVENOUS
  Filled 2023-01-30: qty 1

## 2023-01-30 MED ORDER — KETOROLAC TROMETHAMINE 30 MG/ML IJ SOLN
15.0000 mg | Freq: Once | INTRAMUSCULAR | Status: AC
  Administered 2023-01-30: 15 mg via INTRAVENOUS
  Filled 2023-01-30: qty 1

## 2023-01-30 MED ORDER — ONDANSETRON HCL 4 MG/2ML IJ SOLN
4.0000 mg | Freq: Once | INTRAMUSCULAR | Status: AC
  Administered 2023-01-30: 4 mg via INTRAVENOUS
  Filled 2023-01-30: qty 2

## 2023-01-30 NOTE — Discharge Instructions (Signed)
Your testing is reassuring.  You may have passed a kidney stone.  Your urine is negative for infection.  Take the steroids and muscle relaxers as prescribed.  Watch your blood sugars closely while you are taking the steroids as they may elevate. Suspect we likely have a pinched nerve in your back.  Return to the ED with worsening pain, weakness in your leg, numbness, tingling, bowel or bladder incontinence or other concerns.

## 2023-01-30 NOTE — ED Provider Notes (Signed)
Handoff received from prior provider.  Plan for follow-up chest x-ray.  Patient reportedly presenting for sciatica, had some brief left-sided chest pain, troponins EKG reassuring.  Anticipate discharge if chest x-ray okay.  Chest x-ray obtained and reviewed, reassuring.  No signs of acute abnormality.  On reevaluation she has no chest pain, shortness of breath or other concerns.  Discharged with return precautions.  Recommend PCP follow-up.   Laurence Spates, MD 01/30/23 1535

## 2023-01-30 NOTE — ED Notes (Signed)
Patient oxygen saturation noted to be 77% while sleeping. Patient recovered to 92% on room air when woken up. Patient placed on 2L [place] while sleeping.

## 2023-01-30 NOTE — ED Notes (Signed)
With CT

## 2023-01-30 NOTE — ED Provider Notes (Signed)
[place] [facility] AT [facility] Provider Note   CSN: [id number] Arrival date & time: 01/30/23  0219     History  Chief Complaint  Patient presents with   Back Pain   Urinary Frequency    Elizabeth Huber is a 62 y.o. female.  Patient with a history of hypertension, diabetes, CHF, high cholesterol here with left-sided back pain that spreads diffusely to her abdomen and left leg.  States no fall or trauma.  Pain started yesterday afternoon that did improve briefly with ibuprofen but woke her up again from sleep tonight.  Associated with some pain with urination and frequency but no hematuria.  Pain in left flank and left abdomen is present in abdomen diffusely.  No history of chronic back problems or chronic back surgeries.  Has had spasms in her back previously but this feels different.  No pain with urination but does feel bloated.  No fever.  No blood in the urine.  No vomiting.  No chest pain or shortness of breath, no cough or fever.  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 No bowel or bladder incontinence. No history of IV drug use or cancer.   The history is provided by the patient.  Back Pain Associated symptoms: abdominal pain and dysuria   Associated symptoms: no chest pain, no fever, no headaches and no weakness   Urinary Frequency Associated symptoms include abdominal pain. Pertinent negatives include no chest pain, no headaches and no shortness of breath.       Home Medications Prior to Admission medications   Medication Sig Start Date End Date Taking? Authorizing Provider  acetaminophen (TYLENOL) 500 MG tablet Take 500 mg by mouth every 6 (six) hours as needed for moderate pain or headache.     [provider]  aspirin EC 81 MG tablet Take 162 mg by mouth 2 (two) times daily as needed for mild pain.    [provider]  atenolol (TENORMIN) 50 MG tablet Take 50 mg by mouth daily.    [provider]  atorvastatin  (LIPITOR) 10 MG tablet Take 10 mg by mouth every Monday, Wednesday, and Friday.  01/30/17   [provider]  furosemide (LASIX) 40 MG tablet Take 1 tablet (40 mg total) by mouth daily. 08/22/16   Vassie Loll, MD  guaifenesin (ROBITUSSIN) 100 MG/5ML syrup Take 400 mg by mouth 3 (three) times daily as needed for cough or congestion.    [provider]  ibuprofen (ADVIL) 800 MG tablet Take 800 mg by mouth every 8 (eight) hours as needed. 10/26/20   [provider]  insulin NPH-regular (NOVOLIN 70/30) (70-30) 100 UNIT/ML injection Inject 80 Units into the skin 2 (two) times daily with a meal.     [provider]  loratadine (CLARITIN) 10 MG tablet Take 1 tablet (10 mg total) by mouth daily. 08/22/16   Vassie Loll, MD  losartan (COZAAR) 25 MG tablet Take 25 mg by mouth daily.    [provider]  meloxicam (MOBIC) 15 MG tablet Take 1 tablet daily for shoulder pain. 10/18/20   Molpus, John, MD  Multiple Vitamins-Minerals (CENTRUM PO) Take 1 tablet by mouth daily.     [provider]  omeprazole (PRILOSEC) 20 MG capsule Take 20 mg by mouth 2 (two) times daily before a meal.    [provider]  ondansetron (ZOFRAN-ODT) 4 MG disintegrating tablet Take 1 tablet (4 mg total) by mouth every 8 (eight) hours as  needed for nausea or vomiting. 07/06/21   Haskel Schroeder, PA-C      Allergies    Hydrocodone, Oxycodone, Amoxicillin-pot clavulanate, Exenatide, Metformin hcl, Morphine and codeine, Tradjenta [linagliptin], and Vicodin [hydrocodone-acetaminophen]    Review of Systems   Review of Systems  Constitutional:  Negative for activity change, appetite change and fever.  HENT:  Negative for congestion.   Respiratory:  Negative for cough, chest tightness and shortness of breath.   Cardiovascular:  Negative for chest pain.  Gastrointestinal:  Positive for abdominal pain and nausea. Negative for vomiting.  Genitourinary:  Positive for dysuria and  frequency.  Musculoskeletal:  Positive for arthralgias, back pain and myalgias.  Skin:  Negative for rash.  Neurological:  Negative for dizziness, weakness and headaches.    all other systems are negative except as noted in the HPI and PMH.   Physical Exam Updated Vital Signs BP (!) 157/75 (BP Location: Left Wrist)   Pulse 84   Temp (!) 97.3 F (36.3 C) (Oral)   Resp 20   Ht 5\' 4"  (1.626 m)   Wt (!) 163.3 kg   LMP 04/22/2011   SpO2 97%   BMI 61.79 kg/m  Physical Exam Vitals and nursing note reviewed.  Constitutional:      General: She is not in acute distress.    Appearance: She is well-developed.  HENT:     Head: Normocephalic and atraumatic.     Mouth/Throat:     Pharynx: No oropharyngeal exudate.  Eyes:     Conjunctiva/sclera: Conjunctivae normal.     Pupils: Pupils are equal, round, and reactive to light.  Neck:     Comments: No meningismus. Cardiovascular:     Rate and Rhythm: Normal rate and regular rhythm.     Heart sounds: Normal heart sounds. No murmur heard. Pulmonary:     Effort: Pulmonary effort is normal. No respiratory distress.     Breath sounds: Normal breath sounds.  Abdominal:     Palpations: Abdomen is soft.     Tenderness: There is abdominal tenderness. There is no guarding or rebound.     Comments: Diffuse abdominal tenderness. No guarding or rebound  Musculoskeletal:        General: Tenderness present. Normal range of motion.     Cervical back: Normal range of motion and neck supple.     Comments: Left CVA tenderness 5/5 strength in lower extremities. Intact DP and PT pulses  Skin:    General: Skin is warm.  Neurological:     Mental Status: She is alert and oriented to person, place, and time.     Cranial Nerves: No cranial nerve deficit.     Motor: No abnormal muscle tone.     Coordination: Coordination normal.     Comments:  5/5 strength throughout. CN 2-12 intact.Equal grip strength.   Psychiatric:        Behavior: Behavior normal.      ED Results / Procedures / Treatments   Labs (all labs ordered are listed, but only abnormal results are displayed) Labs Reviewed  URINALYSIS, ROUTINE W REFLEX MICROSCOPIC - Abnormal; Notable for the following components:      Result Value   APPearance HAZY (*)    All other components within normal limits  CBC WITH DIFFERENTIAL/PLATELET - Abnormal; Notable for the following components:   WBC 11.6 (*)    Neutro Abs 8.3 (*)    All other components within normal limits  COMPREHENSIVE METABOLIC PANEL - Abnormal; Notable for the  following components:   Chloride 97 (*)    Glucose, Bld 136 (*)    BUN 26 (*)    All other components within normal limits  CBG MONITORING, ED - Abnormal; Notable for the following components:   Glucose-Capillary 142 (*)    All other components within normal limits  LIPASE, BLOOD  D-DIMER, QUANTITATIVE  TROPONIN I (HIGH SENSITIVITY)  TROPONIN I (HIGH SENSITIVITY)    EKG EKG Interpretation Date/Time:  Friday January 30 2023 06:50:41 EDT Ventricular Rate:  71 PR Interval:  135 QRS Duration:  104 QT Interval:  415 QTC Calculation: 451 R Axis:   61  Text Interpretation: Sinus rhythm Low voltage, precordial leads No significant change was found Confirmed by Glynn Octave [phone]) on 01/30/2023 7:09:49 AM  Radiology CT Renal Stone Study  Result Date: 01/30/2023 CLINICAL DATA:  Abdominal/flank pain with stone suspected EXAM: CT ABDOMEN AND PELVIS WITHOUT CONTRAST TECHNIQUE: Multidetector CT imaging of the abdomen and pelvis was performed following the standard protocol without IV contrast. RADIATION DOSE REDUCTION: This exam was performed according to the departmental dose-optimization program which includes automated exposure control, adjustment of the mA and/or kV according to patient size and/or use of iterative reconstruction technique. COMPARISON:  07/06/2021 FINDINGS: Lower chest:  No contributory findings. Hepatobiliary: No focal liver  abnormality.No evidence of biliary obstruction or stone. Pancreas: Unremarkable. Spleen: Unremarkable. Adrenals/Urinary Tract: Negative adrenals. No hydronephrosis or stone. Unremarkable bladder. Stomach/Bowel:  No obstruction. No appendicitis. Vascular/Lymphatic: No acute vascular abnormality. Scattered atheromatous calcifications. No mass or adenopathy. Reproductive:No pathologic findings.Located IUD. Other: No ascites or pneumoperitoneum. Musculoskeletal: No acute abnormalities. Degenerative endplate and facet spurring diffusely, reference reformats. IMPRESSION: No acute finding.  No hydronephrosis or ureteral calculus. Electronically Signed   By: Tiburcio Pea M.D.   On: 01/30/2023 06:23   CT L-SPINE NO CHARGE  Result Date: 01/30/2023 CLINICAL DATA:  Back pain and urinary symptoms EXAM: CT Lumbar Spine without contrast TECHNIQUE: Technique: Multiplanar CT images of the lumbar spine were reconstructed from contemporary CT of the Abdomen and Pelvis. RADIATION DOSE REDUCTION: This exam was performed according to the departmental dose-optimization program which includes automated exposure control, adjustment of the mA and/or kV according to patient size and/or use of iterative reconstruction technique. CONTRAST:  None COMPARISON:  Lumbar MRI 10/26/2011 FINDINGS: Segmentation: 5 lumbar type vertebrae. Alignment: Normal Vertebrae: No acute fracture or focal pathologic process. Paraspinal and other soft tissues: Reported separately Disc levels: T12- L1: Unremarkable. L1-L2: Unremarkable. L2-L3: Partially calcified right foraminal herniation and impingement. L3-L4: Prominent degenerative facet spurring. Disc bulging with partially calcified right foraminal herniation. Moderate right foraminal stenosis. L4-L5: Prominent degenerative facet spurring. Mild disc bulging with moderate left foraminal stenosis. L5-S1:Degenerative facet spurring on both sides. IMPRESSION: 1. No acute osseous finding. 2. Lumbar spine  degeneration especially affecting facets at L3-4 and below. There is also a notable right foraminal herniation at L2-3 causing impingement. Electronically Signed   By: Tiburcio Pea M.D.   On: 01/30/2023 06:11    Procedures Procedures    Medications Ordered in ED Medications  fentaNYL (SUBLIMAZE) injection 50 mcg (has no administration in time range)  ondansetron (ZOFRAN) injection 4 mg (has no administration in time range)    ED Course/ Medical Decision Making/ A&P Clinical Course as of 01/30/23 0723  Fri Jan 30, 2023  0721 Sciatica, possible L chest pain, labs okay, follow up CXR, then DC [JD]    Clinical Course User Index [JD] Laurence Spates, MD  Medical Decision Making Amount and/or Complexity of Data Reviewed Labs: ordered. Decision-making details documented in ED Course. Radiology: ordered and independent interpretation performed. Decision-making details documented in ED Course. ECG/medicine tests: ordered and independent interpretation performed. Decision-making details documented in ED Course.  Risk Prescription drug management.   Left flank tenderness that spreads to her abdomen diffusely.  Some urinary symptoms.  Stable vital signs. Intact distal strength, pulses, sensation.  Urinalysis is negative for infection or hematuria.  D-dimer is negative with low suspicion for atypical PE.  Denies chest pain or shortness of breath.  Left-sided flank and abdominal pain is concerning for possible kidney stone.  Will obtain imaging.  CT negative for AAA. CT negative for obstructive uropathy or other explanation for flank pain.  Urinalysis is negative.  She does have multilevel level degenerative disc disease on CT scan with nerve root impingement right greater than left.  This may be contributing to her back pain though she is more symptomatic on the left side.  Low suspicion for cord compression or cauda equina.  Will give course of  steroids and muscle relaxers.  Patient cautioned to watch blood sugars carefully while taking steroids.  Her pain does radiate to her left lower ribs and upper abdomen.  D-dimer was negative with low suspicion for pulmonary embolism or aortic dissection.  Troponin and EKG are reassuring without acute ischemia.  Chest x-ray pending at shift change..  Anticipate discharge home with symptomatic control for her sciatica back pain.        Final Clinical Impression(s) / ED Diagnoses Final diagnoses:  Acute left-sided low back pain with sciatica, sciatica laterality unspecified    Rx / DC Orders ED Discharge Orders     None         Bettie Capistran, Jeannett Senior, MD 01/30/23 2034

## 2023-01-30 NOTE — ED Notes (Signed)
Elizabeth Huber ambulated around the nurses' station on room air.  Her O2 sat dropped to 89% and she recovered into the 90s after stopping for about 30 seconds.

## 2023-01-30 NOTE — ED Notes (Signed)
Reviewed discharge instructions with pt. Pt states understanding. Denies dizziness or SOB. Pt discharged via [facility] for home

## 2023-01-30 NOTE — ED Triage Notes (Signed)
Back pain with urinary symptoms started yesterday.

## 2023-02-02 ENCOUNTER — Emergency Department (HOSPITAL_COMMUNITY): Payer: Commercial Managed Care - HMO

## 2023-02-02 ENCOUNTER — Emergency Department (HOSPITAL_COMMUNITY)
Admission: EM | Admit: 2023-02-02 | Discharge: 2023-02-02 | Disposition: A | Discharge status: Home / Self Care | Payer: Commercial Managed Care - HMO | Attending: Emergency Medicine | Admitting: Emergency Medicine

## 2023-02-02 ENCOUNTER — Other Ambulatory Visit: Payer: Self-pay

## 2023-02-02 DIAGNOSIS — K219 Gastro-esophageal reflux disease without esophagitis: Secondary | ICD-10-CM

## 2023-02-02 DIAGNOSIS — I2510 Atherosclerotic heart disease of native coronary artery without angina pectoris: Secondary | ICD-10-CM | POA: Insufficient documentation

## 2023-02-02 DIAGNOSIS — I509 Heart failure, unspecified: Secondary | ICD-10-CM | POA: Insufficient documentation

## 2023-02-02 DIAGNOSIS — R0789 Other chest pain: Secondary | ICD-10-CM | POA: Insufficient documentation

## 2023-02-02 DIAGNOSIS — Z79899 Other long term (current) drug therapy: Secondary | ICD-10-CM | POA: Insufficient documentation

## 2023-02-02 DIAGNOSIS — E119 Type 2 diabetes mellitus without complications: Secondary | ICD-10-CM | POA: Diagnosis not present

## 2023-02-02 DIAGNOSIS — R1013 Epigastric pain: Secondary | ICD-10-CM | POA: Diagnosis present

## 2023-02-02 DIAGNOSIS — I110 Hypertensive heart disease with heart failure: Secondary | ICD-10-CM | POA: Diagnosis not present

## 2023-02-02 DIAGNOSIS — Z794 Long term (current) use of insulin: Secondary | ICD-10-CM | POA: Diagnosis not present

## 2023-02-02 DIAGNOSIS — D72829 Elevated white blood cell count, unspecified: Secondary | ICD-10-CM | POA: Insufficient documentation

## 2023-02-02 DIAGNOSIS — Z7982 Long term (current) use of aspirin: Secondary | ICD-10-CM | POA: Insufficient documentation

## 2023-02-02 LAB — CBC
HCT: 39.3 % (ref 36.0–46.0)
Hemoglobin: 12.7 g/dL (ref 12.0–15.0)
MCH: 29.1 pg (ref 26.0–34.0)
MCHC: 32.3 g/dL (ref 30.0–36.0)
MCV: 89.9 fL (ref 80.0–100.0)
Platelets: 312 10*3/uL (ref 150–400)
RBC: 4.37 MIL/uL (ref 3.87–5.11)
RDW: 12.3 % (ref 11.5–15.5)
WBC: 11.6 10*3/uL — ABNORMAL HIGH (ref 4.0–10.5)
nRBC: 0 % (ref 0.0–0.2)

## 2023-02-02 LAB — HEPATIC FUNCTION PANEL
ALT: 15 U/L (ref 0–44)
AST: 14 U/L — ABNORMAL LOW (ref 15–41)
Albumin: 3.1 g/dL — ABNORMAL LOW (ref 3.5–5.0)
Alkaline Phosphatase: 106 U/L (ref 38–126)
Bilirubin, Direct: <0.1 mg/dL (ref 0.0–0.2)
Total Bilirubin: 0.3 mg/dL (ref 0.3–1.2)
Total Protein: 6.8 g/dL (ref 6.5–8.1)

## 2023-02-02 LAB — TROPONIN I (HIGH SENSITIVITY)
Troponin I (High Sensitivity): 3 ng/L (ref <18)
Troponin I (High Sensitivity): 4 ng/L (ref <18)

## 2023-02-02 LAB — BASIC METABOLIC PANEL
Anion gap: 14 (ref 5–15)
BUN: 18 mg/dL (ref 8–23)
CO2: 22 mmol/L (ref 22–32)
Calcium: 8.6 mg/dL — ABNORMAL LOW (ref 8.9–10.3)
Chloride: 100 mmol/L (ref 98–111)
Creatinine, Ser: 0.71 mg/dL (ref 0.44–1.00)
GFR, Estimated: >60 mL/min (ref >60)
Glucose, Bld: 82 mg/dL (ref 70–99)
Potassium: 3.6 mmol/L (ref 3.5–5.1)
Sodium: 136 mmol/L (ref 135–145)

## 2023-02-02 LAB — LIPASE, BLOOD: Lipase: 22 U/L (ref 11–51)

## 2023-02-02 MED ORDER — ALUM & MAG HYDROXIDE-SIMETH 200-200-20 MG/5ML PO SUSP
30.0000 mL | Freq: Once | ORAL | Status: AC
  Administered 2023-02-02: 30 mL via ORAL
  Filled 2023-02-02: qty 30

## 2023-02-02 MED ORDER — DICYCLOMINE HCL 10 MG/5ML PO SOLN
10.0000 mg | Freq: Once | ORAL | Status: AC
  Administered 2023-02-02: 10 mg via ORAL
  Filled 2023-02-02: qty 5

## 2023-02-02 NOTE — ED Provider Notes (Signed)
[place] EMERGENCY DEPARTMENT AT [facility] Provider Note   CSN: [id number] Arrival date & time: 02/02/23  0217     History  Chief Complaint  Patient presents with   Chest Pain    Elizabeth Huber is a 62 y.o. female who presents with concern for epigastric pain and lower central chest pain that woke he from her sleep at midnight.  Describes pain as a burning type pain, does not radiate across the chest but was simultaneously having pain in the left arm.  Shortness of breath with peak severity of pain.  No palpitations, syncope, pain is not relieved with rest, nonexertional.  Was improved with nitroglycerin with EMS.  I reviewed her medical records with history of type 2 diabetes insulin-dependent, hyperlipidemia, CAD, hypertension, SVT, CHF.  She is not anticoagulated. HPI     Home Medications Prior to Admission medications   Medication Sig Start Date End Date Taking? Authorizing Provider  acetaminophen (TYLENOL) 500 MG tablet Take 500 mg by mouth every 6 (six) hours as needed for moderate pain or headache.     [provider]  aspirin EC 81 MG tablet Take 162 mg by mouth 2 (two) times daily as needed for mild pain.    [provider]  atenolol (TENORMIN) 50 MG tablet Take 50 mg by mouth daily.    [provider]  atorvastatin (LIPITOR) 10 MG tablet Take 10 mg by mouth every Monday, Wednesday, and Friday.  01/30/17   [provider]  furosemide (LASIX) 40 MG tablet Take 1 tablet (40 mg total) by mouth daily. 08/22/16   Vassie Loll, MD  guaifenesin (ROBITUSSIN) 100 MG/5ML syrup Take 400 mg by mouth 3 (three) times daily as needed for cough or congestion.    [provider]  ibuprofen (ADVIL) 800 MG tablet Take 800 mg by mouth every 8 (eight) hours as needed. 10/26/20   [provider]  insulin NPH-regular (NOVOLIN 70/30) (70-30) 100 UNIT/ML injection Inject 80 Units into the skin 2 (two) times daily with a meal.      [provider]  loratadine (CLARITIN) 10 MG tablet Take 1 tablet (10 mg total) by mouth daily. 08/22/16   Vassie Loll, MD  losartan (COZAAR) 25 MG tablet Take 25 mg by mouth daily.    [provider]  meloxicam (MOBIC) 15 MG tablet Take 1 tablet daily for shoulder pain. 10/18/20   Molpus, Jonny Ruiz, MD  methocarbamol (ROBAXIN) 500 MG tablet Take 1 tablet (500 mg total) by mouth every 8 (eight) hours as needed for muscle spasms. 01/30/23   Rancour, Jeannett Senior, MD  methylPREDNISolone (MEDROL DOSEPAK) 4 MG TBPK tablet As directed 01/30/23   Rancour, Jeannett Senior, MD  Multiple Vitamins-Minerals (CENTRUM PO) Take 1 tablet by mouth daily.     [provider]  omeprazole (PRILOSEC) 20 MG capsule Take 20 mg by mouth 2 (two) times daily before a meal.    [provider]  ondansetron (ZOFRAN-ODT) 4 MG disintegrating tablet Take 1 tablet (4 mg total) by mouth every 8 (eight) hours as needed for nausea or vomiting. 07/06/21   Haskel Schroeder, PA-C      Allergies    Hydrocodone, Oxycodone, Amoxicillin-pot clavulanate, Exenatide, Metformin hcl, Morphine and codeine, Tradjenta [linagliptin], and Vicodin [hydrocodone-acetaminophen]    Review of Systems   Review of Systems  Constitutional: Negative.   HENT: Negative.    Respiratory:  Positive for shortness of breath.   Cardiovascular:  Positive for chest pain.  Gastrointestinal: Negative.  Genitourinary: Negative.   Neurological: Negative.     Physical Exam Updated Vital Signs BP (!) 108/47   Pulse 69   Temp 97.9 F (36.6 C)   Resp 18   Ht 5\' 4"  (1.626 m)   Wt (!) 163.3 kg   LMP 04/22/2011   SpO2 95%   BMI 61.79 kg/m  Physical Exam Vitals and nursing note reviewed.  Constitutional:      Appearance: She is morbidly obese. She is not ill-appearing or toxic-appearing.  HENT:     Head: Normocephalic and atraumatic.     Mouth/Throat:     Mouth: Mucous membranes are moist.     Pharynx: No oropharyngeal exudate or  posterior oropharyngeal erythema.  Eyes:     General:        Right eye: No discharge.        Left eye: No discharge.     Conjunctiva/sclera: Conjunctivae normal.  Cardiovascular:     Rate and Rhythm: Normal rate and regular rhythm.     Pulses: Normal pulses.     Heart sounds: Normal heart sounds.  Pulmonary:     Effort: Pulmonary effort is normal. No respiratory distress.     Breath sounds: Normal breath sounds. No wheezing or rales.  Chest:     Chest wall: No mass, tenderness or edema.  Abdominal:     General: Bowel sounds are normal. There is no distension.     Palpations: Abdomen is soft.     Tenderness: There is abdominal tenderness in the epigastric area. There is no right CVA tenderness, left CVA tenderness, guarding or rebound.  Musculoskeletal:        General: No deformity.     Cervical back: Neck supple.     Right lower leg: No tenderness. No edema.     Left lower leg: No tenderness. No edema.  Skin:    General: Skin is warm and dry.     Capillary Refill: Capillary refill takes less than 2 seconds.  Neurological:     General: No focal deficit present.     Mental Status: She is alert and oriented to person, place, and time. Mental status is at baseline.  Psychiatric:        Mood and Affect: Mood normal.     ED Results / Procedures / Treatments   Labs (all labs ordered are listed, but only abnormal results are displayed) Labs Reviewed  CBC - Abnormal; Notable for the following components:      Result Value   WBC 11.6 (*)    All other components within normal limits  BASIC METABOLIC PANEL  TROPONIN I (HIGH SENSITIVITY)    EKG None  Radiology No results found.  Procedures Procedures    Medications Ordered in ED Medications - No data to display  ED Course/ Medical Decision Making/ A&P                                 Medical Decision Making 62 year old female who presents with concern for chest pain shortness of breath.  Reassuring vital signs on  intake.  Cardiopulmonary exam is unremarkable, abdominal exam with exquisite epigastric tenderness palpation.  No lower extremity edema.  Differential diagnosis of epigastric pain includes but is not limited to: Functional or nonulcer dyspepsia (MCC), PUD, GERD, Gastritis, (NSAIDs, alcohol, stress, H. pylori, pernicious anemia), pancreatitis / pancreatic cancer, overeating indigestion (high-fat foods, coffee), drugs (aspirin, antibiotics (eg, macrolides, metronidazole), corticosteroids,  digoxin, narcotics, theophylline), gastroparesis, gastric volvulus, gastric cancer, lactose intolerance, malabsorption, parasitic infection (Giardia, Strongyloides, Ascaris), abdominal hernia, intestinal ischemia, esophageal rupture,  cholelithiasis /choledocholithiasis / cholangitis, hepatitis, ACS, pericarditis, pneumonia, pregnancy.  Amount and/or Complexity of Data Reviewed Labs: ordered.    Details: CBC with leukocytosis of 11.6, BMP unremarkable, troponin negative, 3.  Hepatic function panel and lipase are normal. Radiology: ordered.    Details: Chest x-ray negative for acute cardiopulmonary disease, visualized by this provider. ECG/medicine tests:     Details: EKG with normal sinus rhythm, no ischemic changes or interval prolongation  Risk OTC drugs. Prescription drug management.   Workup inconsistent with ACS.  Patient's symptoms completely resolved after GI cocktail.  She does also endorse that she has been having reflux flare recently and been belching a lot the last couple of days.  Is on omeprazole for reflux at home.  Follow-up with PCP scheduled for today at 2:30 PM.  Clinical concern for emergent underlying etiology of this patient's symptoms that would warrant further ED workup or inpatient management is exceedingly low.  Elizabeth Huber voiced understanding of her medical evaluation and treatment plan. Each of their questions answered to their expressed satisfaction.  Return precautions were given.  Patient  is well-appearing, stable, and was discharged in good condition.  This chart was dictated using voice recognition software, Dragon. Despite the best efforts of this provider to proofread and correct errors, errors may still occur which can change documentation meaning.    Final Clinical Impression(s) / ED Diagnoses Final diagnoses:  None    Rx / DC Orders ED Discharge Orders     None         Sherrilee Gilles 02/02/23 0631    Nira Conn, MD 02/02/23 [phone]

## 2023-02-02 NOTE — ED Triage Notes (Signed)
BIBA from home for c/o chest pressure starting at 0000, central chest and radiating to left arm, 7/10 currently, received 1 nitroglycerin, 324 mg ASA and 4 mg Zofran, hx anxiety, seen Friday at [facility]

## 2023-02-02 NOTE — Discharge Instructions (Signed)
You were seen in the ER today for your belching and chest pressure. Your workup was reassuring. This does not appear to be related to your heart. It is likely related to reflux. Please continue your omeprazole and follow up with your PCP as scheduled. Return to the ER with any new severe symptoms.

## 2023-05-21 IMAGING — CT CT ABD-PELV W/ CM
2 of 5 series · 16 of 46 positions shown, 18 images · IV contrast (agent unspecified)
Comparison: 11/04/2018

CLINICAL DATA: Abdominal pain

EXAM:
CT ABDOMEN AND PELVIS WITH CONTRAST
TECHNIQUE: Multidetector CT imaging of the abdomen and pelvis was performed
using the standard protocol following bolus administration of
intravenous contrast.

[Series 2: axial st · axial · 0.84mm/px · z∈[+898,+1313]mm · 13 of 97 slices shown, 15 images]
[im 7/97  soft-tissue]
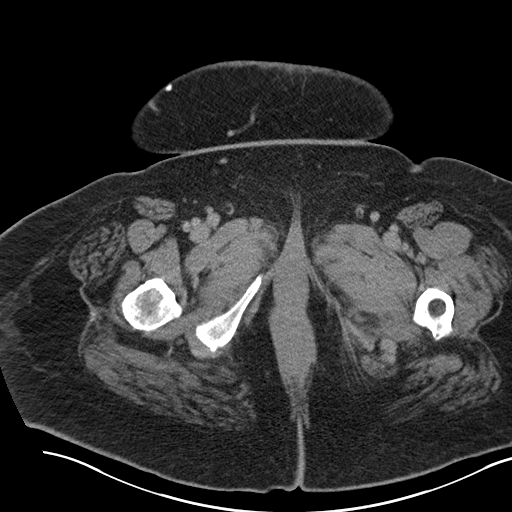
[im 7/97  bone]
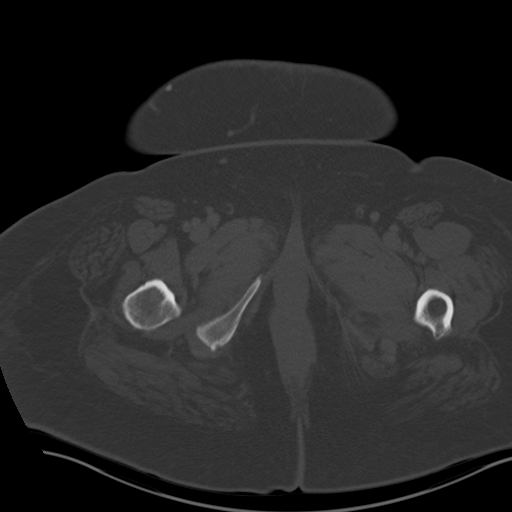
[im 13/97  soft-tissue]
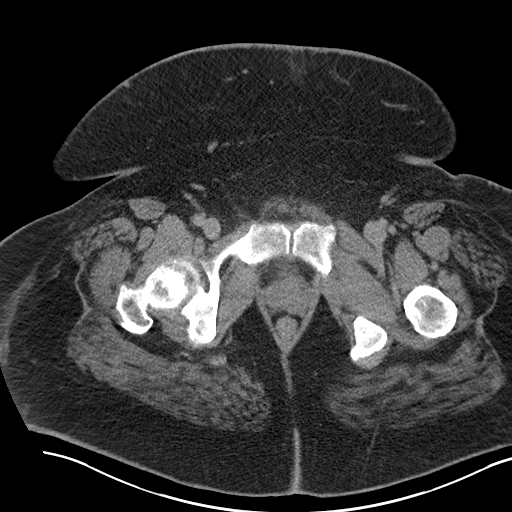
[im 20/97  soft-tissue]
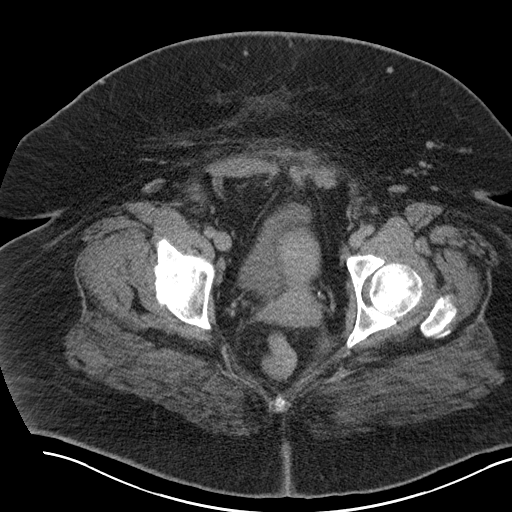
[im 26/97  soft-tissue]
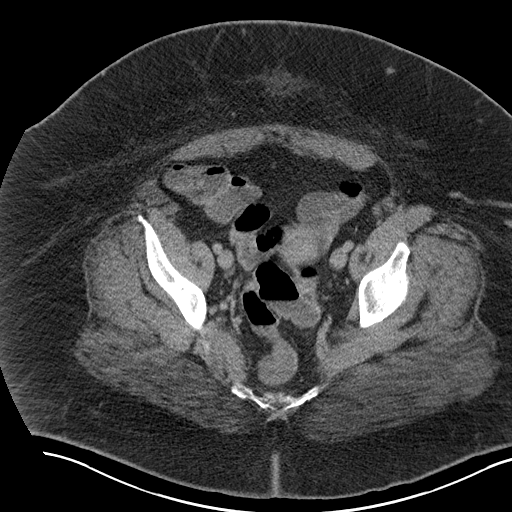
[im 33/97  soft-tissue]
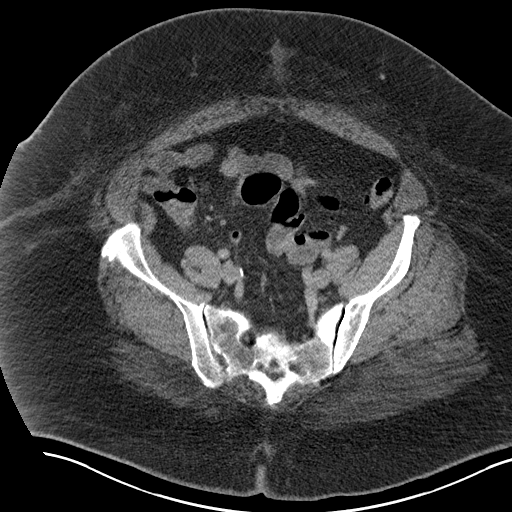
[im 39/97  soft-tissue]
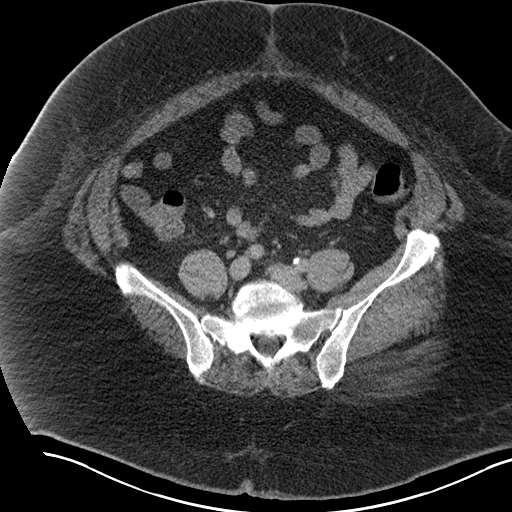
[im 52/97  soft-tissue]
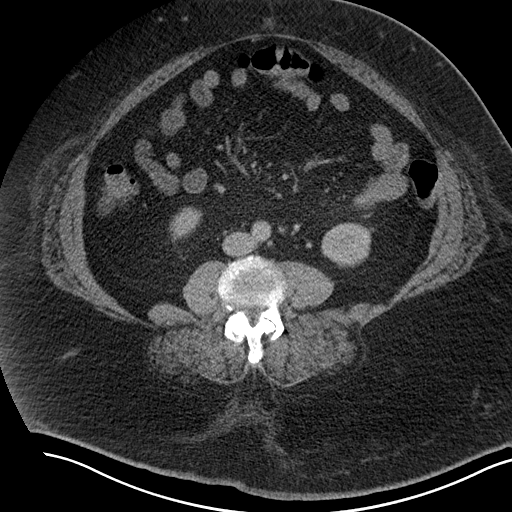
[im 58/97  soft-tissue]
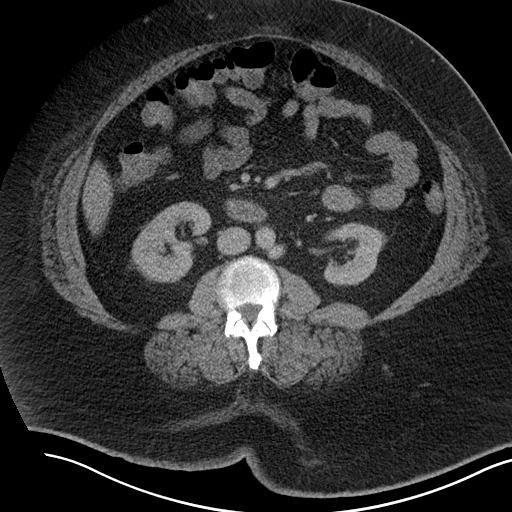
[im 65/97  soft-tissue]
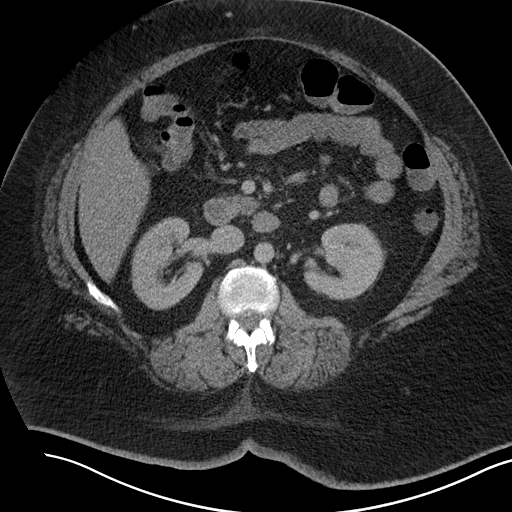
[im 65/97  bone]
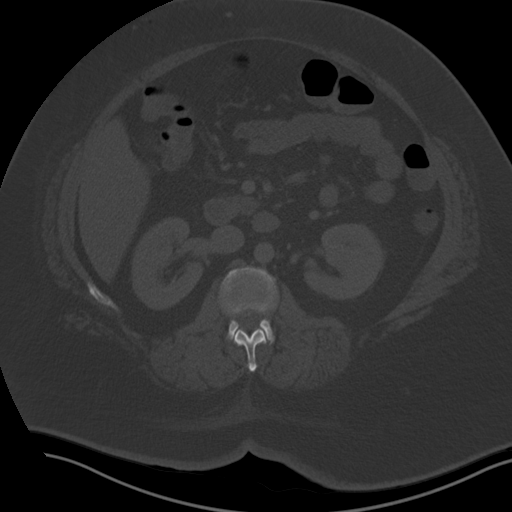
[im 71/97  soft-tissue]
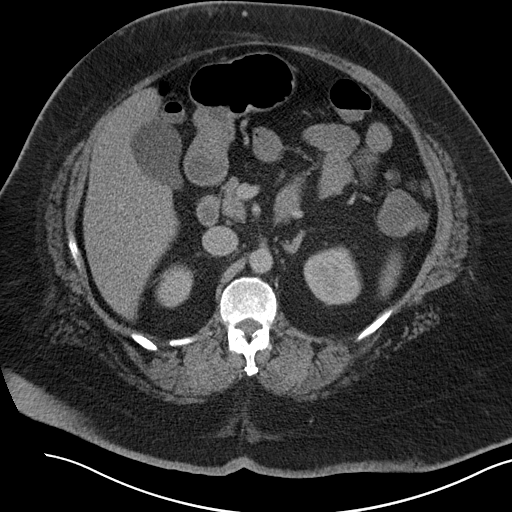
[im 77/97  soft-tissue]
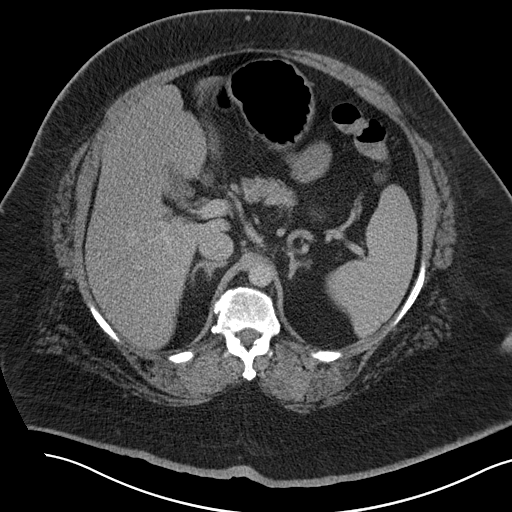
[im 84/97  soft-tissue]
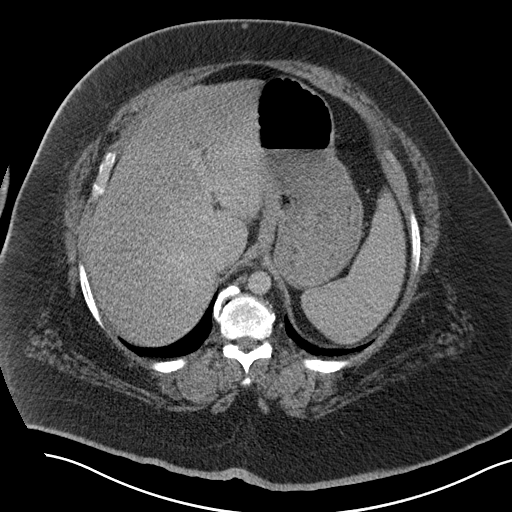
[im 90/97  soft-tissue]
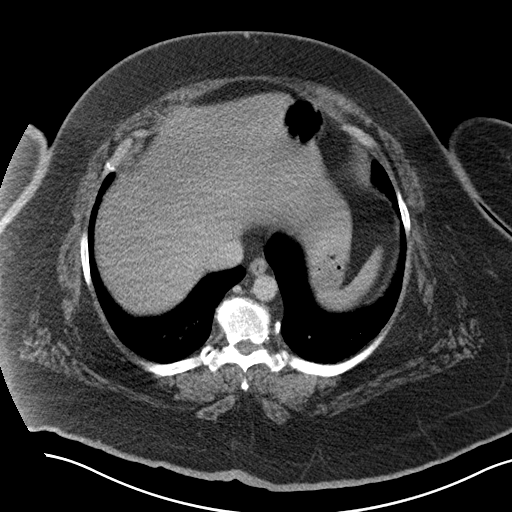

[Series 5: coronal st · coronal · 0.96mm/px · 3 of 206 slices shown]
[im 69/206  soft-tissue]
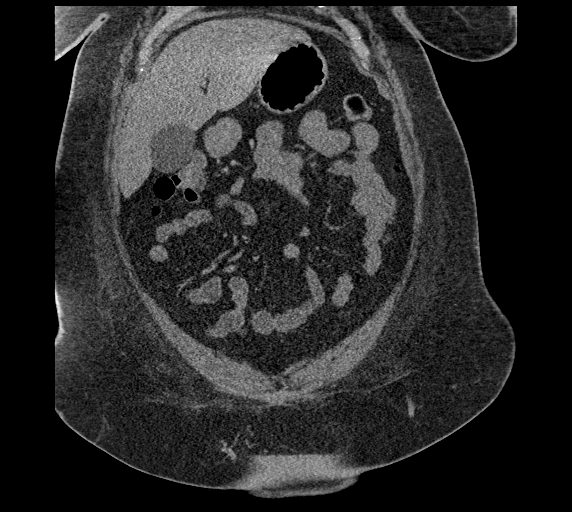
[im 92/206  soft-tissue]
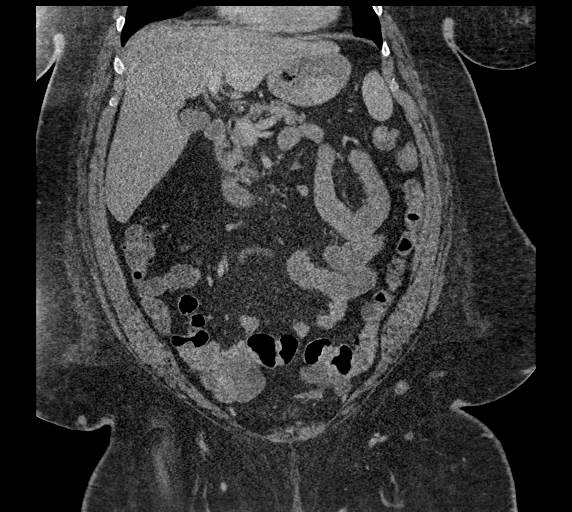
[im 114/206  soft-tissue]
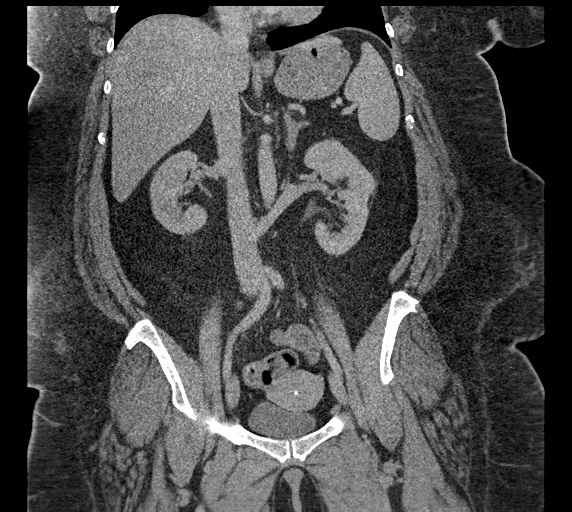

[16 of 46 positions shown; findings below may reference images not displayed]

RADIATION DOSE REDUCTION: This exam was performed according to the
departmental dose-optimization program which includes automated
exposure control, adjustment of the mA and/or kV according to
patient size and/or use of iterative reconstruction technique.

CONTRAST:  100mL OMNIPAQUE IOHEXOL 300 MG/ML  SOLN
FINDINGS: Lower chest: No acute abnormality.

Hepatobiliary: No focal liver abnormality is seen. No gallstones,
gallbladder wall thickening, or biliary dilatation.

Pancreas: Unremarkable. No pancreatic ductal dilatation or
surrounding inflammatory changes.

Spleen: Normal in size without focal abnormality.

Adrenals/Urinary Tract: Normal adrenal glands. No nephrolithiasis,
hydronephrosis, or mass. No hydroureter or ureteral lithiasis
identified. The urinary bladder appears normal.

Stomach/Bowel: Stomach appears normal. There is no bowel wall
thickening, inflammation, or distension. The appendix is visualized
and appears normal.

Vascular/Lymphatic: Aortic atherosclerosis. No aneurysm. No
abdominopelvic adenopathy.

Reproductive: IUD is noted within the endometrial cavity. No adnexal
mass.

Other: No free fluid or fluid collections.

Musculoskeletal: Bilateral facet arthropathy noted within the lower
lumbar spine. Vertebral body heights and disc spaces are well
preserved.
IMPRESSION: 1. No acute findings within the abdomen or pelvis.
2. Aortic Atherosclerosis ([id number]).

## 2023-05-26 ENCOUNTER — Ambulatory Visit: Payer: Commercial Managed Care - HMO | Admitting: Internal Medicine

## 2023-06-25 ENCOUNTER — Encounter: Payer: Self-pay | Admitting: Gastroenterology

## 2023-06-25 ENCOUNTER — Ambulatory Visit (INDEPENDENT_AMBULATORY_CARE_PROVIDER_SITE_OTHER): Payer: Commercial Managed Care - HMO | Admitting: Gastroenterology

## 2023-06-25 VITALS — BP 118/72 | HR 80 | Ht 64.0 in | Wt 349.5 lb

## 2023-06-25 DIAGNOSIS — Z01818 Encounter for other preprocedural examination: Secondary | ICD-10-CM | POA: Diagnosis not present

## 2023-06-25 DIAGNOSIS — Z860100 Personal history of colon polyps, unspecified: Secondary | ICD-10-CM | POA: Diagnosis not present

## 2023-06-25 DIAGNOSIS — Z6841 Body Mass Index (BMI) 40.0 and over, adult: Secondary | ICD-10-CM | POA: Diagnosis not present

## 2023-06-25 MED ORDER — SUFLAVE 178.7 G PO SOLR
1.0000 | Freq: Once | ORAL | 0 refills | Status: AC
Start: 2023-06-25 — End: 2023-06-25

## 2023-06-25 NOTE — Progress Notes (Signed)
 06/25/2023 Elizabeth Huber [id number] November 13, 1960   HISTORY OF PRESENT ILLNESS: This is a 63 year old female who is new to our office.  She was actually referred here by Dr. Kandyce, OB/GYN, for evaluation in regards to colonoscopy.  Patient tells me that she had a colonoscopy probably about 6 or 7 years ago and had polyps removed.  She says that she was told she needed another one in 5 years.  That is the only colonoscopy she has ever had.  She is unsure where this was performed, but was in [place], sounds like probably [facility].  She is not having any complaints.  Moves her bowels regularly.  Denies any rectal bleeding.   Past Medical History:  Diagnosis Date   Angina    Asthma    Chronic bronchitis    have it whenever I have a cold   Diabetes mellitus type 2 in obese    Dysrhythmia 08/03/11   palpitations   GERD (gastroesophageal reflux disease)    Headache(784.0)    Herpes simplex    type 1 & 2   High cholesterol    Hypertension    Phlebitis    Shortness of breath on exertion    Past Surgical History:  Procedure Laterality Date   CORONARY ANGIOGRAM N/A 08/05/2011   Procedure: CORONARY ANGIOGRAM;  Surgeon: Jerel Balding, MD;  Location: [facility] CATH [facility];  Service: Cardiovascular;  Laterality: N/A;   SALPINGECTOMY     w/ovary removal; right   SALPINGOOPHORECTOMY     TOENAIL EXCISION      reports that she has never smoked. She has never used smokeless tobacco. She reports that she does not drink alcohol and does not use drugs. family history includes CAD in her father; Diabetes in her mother and sister; Healthy in her brother; Hypertension in her mother. Allergies  Allergen Reactions   Hydrocodone Other (See Comments)    Makes heart race   Oxycodone  Anxiety   Amoxicillin-Pot Clavulanate     Other Reaction(s): tightness in chest/ stomach pains   Exenatide Nausea Only   Metformin Hcl Diarrhea    Other Reaction(s): stomach upset,    Morphine  And Codeine Other (See  Comments)    Gi upset   Tradjenta [Linagliptin] Anxiety   Vicodin [Hydrocodone-Acetaminophen ] Palpitations      Outpatient Encounter Medications as of 06/25/2023  Medication Sig   acetaminophen  (TYLENOL ) 500 MG tablet Take 500 mg by mouth every 6 (six) hours as needed for moderate pain or headache.    aspirin  325 MG tablet Take 325 mg by mouth daily.   atenolol  (TENORMIN ) 50 MG tablet Take 50 mg by mouth daily.   atorvastatin  (LIPITOR) 10 MG tablet Take 10 mg by mouth every Monday, Wednesday, and Friday.    furosemide  (LASIX ) 40 MG tablet Take 1 tablet (40 mg total) by mouth daily. (Patient taking differently: Take 40 mg by mouth 2 (two) times daily.)   ibuprofen  (ADVIL ) 800 MG tablet Take 800 mg by mouth every 8 (eight) hours as needed.   insulin  NPH-regular (NOVOLIN 70/30) (70-30) 100 UNIT/ML injection Inject 60 Units into the skin 2 (two) times daily with a meal.   loratadine  (CLARITIN ) 10 MG tablet Take 1 tablet (10 mg total) by mouth daily.   losartan  (COZAAR ) 25 MG tablet Take 25 mg by mouth daily.   methocarbamol  (ROBAXIN ) 500 MG tablet Take 1 tablet (500 mg total) by mouth every 8 (eight) hours as needed for muscle spasms. (Patient not taking: Reported on  06/25/2023)   Multiple Vitamins-Minerals (CENTRUM PO) Take 1 tablet by mouth daily.    omeprazole  (PRILOSEC) 20 MG capsule Take 20 mg by mouth daily.   No facility-administered encounter medications on file as of 06/25/2023.    REVIEW OF SYSTEMS  : All other systems reviewed and negative except where noted in the History of Present Illness.   PHYSICAL EXAM: BP 118/72   Pulse 80   Ht 5' 4 (1.626 m)   Wt (!) 349 lb 8 oz (158.5 kg)   LMP 04/22/2011   BMI 59.99 kg/m  General: Well developed white female in no acute distress Head: Normocephalic and atraumatic Eyes:  Sclerae anicteric, conjunctiva pink. Ears: Normal auditory acuity Lungs: Clear throughout to auscultation; no W/R/R. Heart: Regular rate and rhythm; no  M/R/G. Rectal: Will be done at the time of colonoscopy. Musculoskeletal: Symmetrical with no gross deformities  Skin: No lesions on visible extremities Extremities: No edema  Neurological: Alert oriented x 4, grossly non-focal Psychological:  Alert and cooperative. Normal mood and affect  ASSESSMENT AND PLAN: *Personal history of colon polyps: Reports history of polyps on colonoscopy 6 or 7 years ago.  Says that she was told she needed another in 5 years.  We do not have records, but sounds like this is probably performed at [facility].  Will have her sign for records.  Will tentatively give her on the schedule for colonoscopy at [facility] next available with Dr. Nandigam due to BMI being over 50..  The risks, benefits, and alternatives to colonoscopy were discussed with the patient and she consents to proceed.  CC:  Gib Charleston, MD CC: Dr. Kandyce

## 2023-06-25 NOTE — Patient Instructions (Signed)
 You have been scheduled for a colonoscopy. Please follow written instructions given to you at your visit today.   If you use inhalers (even only as needed), please bring them with you on the day of your procedure.  DO NOT TAKE 7 DAYS PRIOR TO TEST- Trulicity (dulaglutide) Ozempic, Wegovy (semaglutide) Mounjaro (tirzepatide) Bydureon Bcise (exanatide extended release)  DO NOT TAKE 1 DAY PRIOR TO YOUR TEST Rybelsus (semaglutide) Adlyxin (lixisenatide) Victoza (liraglutide) Byetta (exanatide) ___________________________________________________________________________  [name] will receive your bowel preparation through [facility], which ensures the lowest copay and home delivery, with outreach via text or call from an [phone] number. Please respond promptly to avoid rescheduling of your procedure. If you are interested in alternative options or have any questions regarding your prep, please contact them at [phone] ____________________________________________________________________________  Your Provider Has Sent Your Bowel Prep Regimen To [facility]   [facility] will contact you to verify your information and collect your copay, if applicable. Enjoy the comfort of your home while your prescription is mailed to you, FREE of any shipping charges.   [facility] accepts all major insurance benefits and applies discounts & coupons.  Have additional questions?   Chat: [email or web address] Call: [phone] Email: [email or web address] NCPDP: [id number]  How will [facility] contact you?  With a Welcome phone call,  a Welcome text and a checkout link in text form.  Texts you receive from [phone] Are NOT Spam.  *To set up delivery, you must complete the checkout process via link or speak to one of the patient care representatives. If [facility] is unable to reach you, your prescription may be delayed.  To avoid long hold times on the phone, you may also utilize the secure chat  feature on the [facility] website to request that they call you back for transaction completion or to expedite your concerns.  _______________________________________________________  If your blood pressure at your visit was 140/90 or greater, please contact your primary care physician to follow up on this.  _______________________________________________________  If you are age 53 or older, your body mass index should be between 23-30. Your Body mass index is 59.99 kg/m. If this is out of the aforementioned range listed, please consider follow up with your Primary Care Provider.  If you are age 71 or younger, your body mass index should be between 19-25. Your Body mass index is 59.99 kg/m. If this is out of the aformentioned range listed, please consider follow up with your Primary Care Provider.   ________________________________________________________  The [facility] providers would like to encourage you to use MYCHART to communicate with providers for non-urgent requests or questions.  Due to long hold times on the telephone, sending your provider a message by [facility] may be a faster and more efficient way to get a response.  Please allow 48 business hours for a response.  Please remember that this is for non-urgent requests.  _______________________________________________________

## 2023-06-30 ENCOUNTER — Ambulatory Visit: Payer: BC Managed Care – PPO | Admitting: Dermatology

## 2023-07-22 ENCOUNTER — Telehealth: Payer: Self-pay | Admitting: Gastroenterology

## 2023-07-22 NOTE — Telephone Encounter (Addendum)
 Procedure:Colonoscopy Procedure date: 07/30/23 Procedure location: [facility] Arrival Time: 9:15 am Spoke with the patient Y/N: No Any prep concerns? No  Has the patient obtained the prep from the pharmacy ? NA Do you have a care partner and transportation? NO Any additional concerns? NO   I left a detailed message for the patient to return call  07/22/23 @ 1:30 pm  I left a detailed message for the patient to return call  07/23/23 @ 1:29 pm  Beth talked to the patient and procedure was canceled due to upper respiratory infection.

## 2023-07-23 ENCOUNTER — Telehealth: Payer: Self-pay | Admitting: Gastroenterology

## 2023-07-23 NOTE — Telephone Encounter (Signed)
 Patient called and stated that she is needing to cancel her procedure at the hospital and reschedule for a different time. Patient is requesting a call back. Please advise.

## 2023-07-23 NOTE — Telephone Encounter (Signed)
 Spoke with the patient. She currently has an upper respiratory infection. No antibiotics. No fever. She would like to postpone her colonoscopy.  Procedure canceled. Will put her on the wait list.

## 2023-07-30 ENCOUNTER — Ambulatory Visit (HOSPITAL_COMMUNITY): Admit: 2023-07-30 | Payer: Commercial Managed Care - HMO | Admitting: Gastroenterology

## 2023-07-30 ENCOUNTER — Encounter (HOSPITAL_COMMUNITY): Payer: Self-pay

## 2023-07-30 SURGERY — COLONOSCOPY WITH PROPOFOL
Anesthesia: Monitor Anesthesia Care

## 2023-08-10 ENCOUNTER — Other Ambulatory Visit: Payer: Self-pay

## 2023-08-10 ENCOUNTER — Encounter (HOSPITAL_COMMUNITY): Payer: Self-pay | Admitting: Internal Medicine

## 2023-08-10 ENCOUNTER — Emergency Department (HOSPITAL_COMMUNITY)

## 2023-08-10 ENCOUNTER — Observation Stay (HOSPITAL_COMMUNITY)
Admission: EM | Admit: 2023-08-10 | Discharge: 2023-08-12 | Disposition: A | Discharge status: Home / Self Care | Attending: Emergency Medicine | Admitting: Emergency Medicine

## 2023-08-10 DIAGNOSIS — I2510 Atherosclerotic heart disease of native coronary artery without angina pectoris: Secondary | ICD-10-CM | POA: Diagnosis not present

## 2023-08-10 DIAGNOSIS — K219 Gastro-esophageal reflux disease without esophagitis: Secondary | ICD-10-CM | POA: Insufficient documentation

## 2023-08-10 DIAGNOSIS — I110 Hypertensive heart disease with heart failure: Secondary | ICD-10-CM | POA: Insufficient documentation

## 2023-08-10 DIAGNOSIS — R197 Diarrhea, unspecified: Secondary | ICD-10-CM | POA: Insufficient documentation

## 2023-08-10 DIAGNOSIS — E785 Hyperlipidemia, unspecified: Secondary | ICD-10-CM | POA: Diagnosis not present

## 2023-08-10 DIAGNOSIS — E876 Hypokalemia: Secondary | ICD-10-CM | POA: Diagnosis not present

## 2023-08-10 DIAGNOSIS — I10 Essential (primary) hypertension: Secondary | ICD-10-CM | POA: Diagnosis not present

## 2023-08-10 DIAGNOSIS — R1084 Generalized abdominal pain: Secondary | ICD-10-CM | POA: Diagnosis present

## 2023-08-10 DIAGNOSIS — E119 Type 2 diabetes mellitus without complications: Secondary | ICD-10-CM | POA: Diagnosis not present

## 2023-08-10 DIAGNOSIS — R112 Nausea with vomiting, unspecified: Secondary | ICD-10-CM | POA: Insufficient documentation

## 2023-08-10 DIAGNOSIS — Z7901 Long term (current) use of anticoagulants: Secondary | ICD-10-CM | POA: Insufficient documentation

## 2023-08-10 DIAGNOSIS — R1013 Epigastric pain: Principal | ICD-10-CM

## 2023-08-10 DIAGNOSIS — I5032 Chronic diastolic (congestive) heart failure: Secondary | ICD-10-CM | POA: Insufficient documentation

## 2023-08-10 DIAGNOSIS — Z79899 Other long term (current) drug therapy: Secondary | ICD-10-CM | POA: Diagnosis not present

## 2023-08-10 DIAGNOSIS — Z794 Long term (current) use of insulin: Secondary | ICD-10-CM

## 2023-08-10 DIAGNOSIS — R109 Unspecified abdominal pain: Secondary | ICD-10-CM | POA: Diagnosis not present

## 2023-08-10 LAB — CBC WITH DIFFERENTIAL/PLATELET
Abs Immature Granulocytes: 0.07 10*3/uL (ref 0.00–0.07)
Basophils Absolute: 0 10*3/uL (ref 0.0–0.1)
Basophils Relative: 0 %
Eosinophils Absolute: 0 10*3/uL (ref 0.0–0.5)
Eosinophils Relative: 0 %
HCT: 42.1 % (ref 36.0–46.0)
Hemoglobin: 14.1 g/dL (ref 12.0–15.0)
Immature Granulocytes: 1 %
Lymphocytes Relative: 3 %
Lymphs Abs: 0.4 10*3/uL — ABNORMAL LOW (ref 0.7–4.0)
MCH: 29 pg (ref 26.0–34.0)
MCHC: 33.5 g/dL (ref 30.0–36.0)
MCV: 86.6 fL (ref 80.0–100.0)
Monocytes Absolute: 0.4 10*3/uL (ref 0.1–1.0)
Monocytes Relative: 3 %
Neutro Abs: 12 10*3/uL — ABNORMAL HIGH (ref 1.7–7.7)
Neutrophils Relative %: 93 %
Platelets: 312 10*3/uL (ref 150–400)
RBC: 4.86 MIL/uL (ref 3.87–5.11)
RDW: 12.3 % (ref 11.5–15.5)
WBC: 12.8 10*3/uL — ABNORMAL HIGH (ref 4.0–10.5)
nRBC: 0 % (ref 0.0–0.2)

## 2023-08-10 LAB — COMPREHENSIVE METABOLIC PANEL
ALT: 15 U/L (ref 0–44)
AST: 17 U/L (ref 15–41)
Albumin: 3.5 g/dL (ref 3.5–5.0)
Alkaline Phosphatase: 128 U/L — ABNORMAL HIGH (ref 38–126)
Anion gap: 10 (ref 5–15)
BUN: 15 mg/dL (ref 8–23)
CO2: 28 mmol/L (ref 22–32)
Calcium: 8.5 mg/dL — ABNORMAL LOW (ref 8.9–10.3)
Chloride: 99 mmol/L (ref 98–111)
Creatinine, Ser: 0.83 mg/dL (ref 0.44–1.00)
GFR, Estimated: >60 mL/min (ref >60)
Glucose, Bld: 215 mg/dL — ABNORMAL HIGH (ref 70–99)
Potassium: 3.6 mmol/L (ref 3.5–5.1)
Sodium: 137 mmol/L (ref 135–145)
Total Bilirubin: 0.8 mg/dL (ref 0.0–1.2)
Total Protein: 7.3 g/dL (ref 6.5–8.1)

## 2023-08-10 LAB — URINALYSIS, ROUTINE W REFLEX MICROSCOPIC
Bilirubin Urine: NEGATIVE
Glucose, UA: NEGATIVE mg/dL
Hgb urine dipstick: NEGATIVE
Ketones, ur: 5 mg/dL — AB
Leukocytes,Ua: NEGATIVE
Nitrite: NEGATIVE
Protein, ur: NEGATIVE mg/dL
Specific Gravity, Urine: 1.044 — ABNORMAL HIGH (ref 1.005–1.030)
pH: 5 (ref 5.0–8.0)

## 2023-08-10 LAB — TROPONIN I (HIGH SENSITIVITY)
Troponin I (High Sensitivity): 2 ng/L (ref <18)
Troponin I (High Sensitivity): 3 ng/L (ref <18)

## 2023-08-10 LAB — RAPID URINE DRUG SCREEN, HOSP PERFORMED
Amphetamines: NOT DETECTED
Barbiturates: NOT DETECTED
Benzodiazepines: NOT DETECTED
Cocaine: NOT DETECTED
Opiates: NOT DETECTED
Tetrahydrocannabinol: NOT DETECTED

## 2023-08-10 LAB — LIPASE, BLOOD: Lipase: 21 U/L (ref 11–51)

## 2023-08-10 LAB — GLUCOSE, CAPILLARY: Glucose-Capillary: 172 mg/dL — ABNORMAL HIGH (ref 70–99)

## 2023-08-10 MED ORDER — INSULIN ASPART 100 UNIT/ML IJ SOLN
0.0000 [IU] | Freq: Three times a day (TID) | INTRAMUSCULAR | Status: DC
  Administered 2023-08-11 – 2023-08-12 (×4): 1 [IU] via SUBCUTANEOUS
  Filled 2023-08-10: qty 0.09

## 2023-08-10 MED ORDER — ASPIRIN 325 MG PO TABS
325.0000 mg | ORAL_TABLET | Freq: Every day | ORAL | Status: DC
  Administered 2023-08-11 – 2023-08-12 (×2): 325 mg via ORAL
  Filled 2023-08-10 (×2): qty 1

## 2023-08-10 MED ORDER — FENTANYL CITRATE PF 50 MCG/ML IJ SOSY
100.0000 ug | PREFILLED_SYRINGE | Freq: Once | INTRAMUSCULAR | Status: AC
  Administered 2023-08-10: 100 ug via INTRAVENOUS
  Filled 2023-08-10: qty 2

## 2023-08-10 MED ORDER — ATORVASTATIN CALCIUM 20 MG PO TABS
40.0000 mg | ORAL_TABLET | Freq: Every day | ORAL | Status: DC
  Administered 2023-08-11 – 2023-08-12 (×2): 40 mg via ORAL
  Filled 2023-08-10 (×2): qty 2

## 2023-08-10 MED ORDER — LOSARTAN POTASSIUM 25 MG PO TABS: 25.0000 mg | ORAL_TABLET | Freq: Every day | ORAL | Status: DC

## 2023-08-10 MED ORDER — FENTANYL CITRATE PF 50 MCG/ML IJ SOSY
25.0000 ug | PREFILLED_SYRINGE | Freq: Once | INTRAMUSCULAR | Status: AC
  Administered 2023-08-10: 25 ug via INTRAVENOUS
  Filled 2023-08-10: qty 1

## 2023-08-10 MED ORDER — ENOXAPARIN SODIUM 40 MG/0.4ML IJ SOSY
40.0000 mg | PREFILLED_SYRINGE | INTRAMUSCULAR | Status: DC
  Administered 2023-08-10: 40 mg via SUBCUTANEOUS
  Filled 2023-08-10: qty 0.4

## 2023-08-10 MED ORDER — INSULIN ASPART PROT & ASPART (70-30 MIX) 100 UNIT/ML ~~LOC~~ SUSP
40.0000 [IU] | Freq: Two times a day (BID) | SUBCUTANEOUS | Status: DC
  Filled 2023-08-10: qty 10

## 2023-08-10 MED ORDER — SODIUM CHLORIDE (PF) 0.9 % IJ SOLN
INTRAMUSCULAR | Status: AC
  Filled 2023-08-10: qty 50

## 2023-08-10 MED ORDER — SODIUM CHLORIDE 0.9 % IV SOLN: INTRAVENOUS | Status: DC

## 2023-08-10 MED ORDER — PANTOPRAZOLE SODIUM 40 MG IV SOLR
40.0000 mg | Freq: Once | INTRAVENOUS | Status: AC
  Administered 2023-08-10: 40 mg via INTRAVENOUS
  Filled 2023-08-10: qty 10

## 2023-08-10 MED ORDER — FENTANYL CITRATE PF 50 MCG/ML IJ SOSY
50.0000 ug | PREFILLED_SYRINGE | Freq: Once | INTRAMUSCULAR | Status: AC
  Administered 2023-08-10: 50 ug via INTRAVENOUS
  Filled 2023-08-10: qty 1

## 2023-08-10 MED ORDER — ONDANSETRON HCL 4 MG/2ML IJ SOLN
4.0000 mg | Freq: Once | INTRAMUSCULAR | Status: AC
  Administered 2023-08-10: 4 mg via INTRAVENOUS
  Filled 2023-08-10: qty 2

## 2023-08-10 MED ORDER — SODIUM CHLORIDE 0.9 % IV BOLUS
1000.0000 mL | Freq: Once | INTRAVENOUS | Status: AC
  Administered 2023-08-10: 1000 mL via INTRAVENOUS

## 2023-08-10 MED ORDER — IOHEXOL 300 MG/ML  SOLN
100.0000 mL | Freq: Once | INTRAMUSCULAR | Status: AC | PRN
  Administered 2023-08-10: 100 mL via INTRAVENOUS

## 2023-08-10 MED ORDER — ATENOLOL 25 MG PO TABS: 50.0000 mg | ORAL_TABLET | Freq: Every day | ORAL | Status: DC

## 2023-08-10 NOTE — ED Notes (Signed)
 Patient resting in bed.

## 2023-08-10 NOTE — ED Provider Notes (Signed)
  EMERGENCY DEPARTMENT AT [facility] Provider Note   CSN: [id number] Arrival date & time: 08/10/23  1030     History {Add pertinent medical, surgical, social history, OB history to HPI:1} Chief Complaint  Patient presents with   Abdominal Pain    Elizabeth Huber is a 63 y.o. female.  63 year old female presents with acute onset of abdominal pain.  Pain is been epigastric, upper quadrant began this morning.  States that she had vomiting noodles and other greasy food last night to eat.  Denies any fever or chills.  Emesis been nonbilious or bloody.  Slight diarrhea noted.  Pain does radiate to her back and characterizes colicky.  Denies any prior history of gallbladder disease.  Called EMS and was given 100 cc of saline informing us of Zofran and transferred here.       Home Medications Prior to Admission medications   Medication Sig Start Date End Date Taking? Authorizing Provider  acetaminophen (TYLENOL) 500 MG tablet Take 500 mg by mouth every 6 (six) hours as needed for moderate pain or headache.     [provider]  aspirin 325 MG tablet Take 325 mg by mouth daily.    [provider]  atenolol (TENORMIN) 50 MG tablet Take 50 mg by mouth daily.    [provider]  atorvastatin (LIPITOR) 10 MG tablet Take 10 mg by mouth every Monday, Wednesday, and Friday.  01/30/17   [provider]  furosemide (LASIX) 40 MG tablet Take 1 tablet (40 mg total) by mouth daily. Patient taking differently: Take 40 mg by mouth 2 (two) times daily. 08/22/16   Vassie Loll, MD  ibuprofen (ADVIL) 800 MG tablet Take 800 mg by mouth every 8 (eight) hours as needed. 10/26/20   [provider]  insulin NPH-regular (NOVOLIN 70/30) (70-30) 100 UNIT/ML injection Inject 60 Units into the skin 2 (two) times daily with a meal.    [provider]  loratadine (CLARITIN) 10 MG tablet Take 1 tablet (10 mg total) by mouth daily. 08/22/16   Vassie Loll, MD  losartan (COZAAR) 25 MG tablet Take 25 mg by mouth daily.    [provider]  methocarbamol (ROBAXIN) 500 MG tablet Take 1 tablet (500 mg total) by mouth every 8 (eight) hours as needed for muscle spasms. Patient not taking: Reported on 06/25/2023 01/30/23   Glynn Octave, MD  Multiple Vitamins-Minerals (CENTRUM PO) Take 1 tablet by mouth daily.     [provider]  omeprazole (PRILOSEC) 20 MG capsule Take 20 mg by mouth daily.    [provider]      Allergies    Hydrocodone, Oxycodone, Amoxicillin-pot clavulanate, Exenatide, Metformin hcl, Morphine and codeine, Tradjenta [linagliptin], and Vicodin [hydrocodone-acetaminophen]    Review of Systems   Review of Systems  All other systems reviewed and are negative.   Physical Exam Updated Vital Signs BP (!) 153/69   Pulse 100   Temp (!) 97.5 F (36.4 C) (Oral)   Resp 18   LMP 04/22/2011   SpO2 (!) 82%  Physical Exam Vitals and nursing note reviewed.  Constitutional:      General: She is not in acute distress.    Appearance: Normal appearance. She is well-developed. She is not toxic-appearing.  HENT:     Head: Normocephalic and atraumatic.  Eyes:     General: Lids are normal.     Conjunctiva/sclera: Conjunctivae normal.     Pupils: Pupils are equal, round, and reactive  to light.  Neck:     Thyroid: No thyroid mass.     Trachea: No tracheal deviation.  Cardiovascular:     Rate and Rhythm: Normal rate and regular rhythm.     Heart sounds: Normal heart sounds. No murmur heard.    No gallop.  Pulmonary:     Effort: Pulmonary effort is normal. No respiratory distress.     Breath sounds: Normal breath sounds. No stridor. No decreased breath sounds, wheezing, rhonchi or rales.  Abdominal:     General: There is no distension.     Palpations: Abdomen is soft.     Tenderness: There is abdominal tenderness in the right upper quadrant and epigastric area. There is guarding. There is no rebound.     Musculoskeletal:        General: No tenderness. Normal range of motion.     Cervical back: Normal range of motion and neck supple.  Skin:    General: Skin is warm and dry.     Findings: No abrasion or rash.  Neurological:     Mental Status: She is alert and oriented to person, place, and time. Mental status is at baseline.     GCS: GCS eye subscore is 4. GCS verbal subscore is 5. GCS motor subscore is 6.     Cranial Nerves: No cranial nerve deficit.     Sensory: No sensory deficit.     Motor: Motor function is intact.  Psychiatric:        Attention and Perception: Attention normal.        Speech: Speech normal.        Behavior: Behavior normal.     ED Results / Procedures / Treatments   Labs (all labs ordered are listed, but only abnormal results are displayed) Labs Reviewed  CBC WITH DIFFERENTIAL/PLATELET  COMPREHENSIVE METABOLIC PANEL  LIPASE, BLOOD    EKG None  Radiology No results found.  Procedures Procedures  {Document cardiac monitor, telemetry assessment procedure when appropriate:1}  Medications Ordered in ED Medications  0.9 %  sodium chloride infusion (has no administration in time range)  sodium chloride 0.9 % bolus 1,000 mL (has no administration in time range)  fentaNYL (SUBLIMAZE) injection 100 mcg (has no administration in time range)    ED Course/ Medical Decision Making/ A&P   {   Click here for ABCD2, HEART and other calculatorsREFRESH Note before signing :1}                              Medical Decision Making Amount and/or Complexity of Data Reviewed Labs: ordered. Radiology: ordered. ECG/medicine tests: ordered.  Risk Prescription drug management.   ***  {Document critical care time when appropriate:1} {Document review of labs and clinical decision tools ie heart score, Chads2Vasc2 etc:1}  {Document your independent review of radiology images, and any outside records:1} {Document your discussion with family members,  caretakers, and with consultants:1} {Document social determinants of health affecting pt's care:1} {Document your decision making why or why not admission, treatments were needed:1} Final Clinical Impression(s) / ED Diagnoses Final diagnoses:  None    Rx / DC Orders ED Discharge Orders     None

## 2023-08-10 NOTE — ED Notes (Signed)
 Patient c/o increased abdominal pain at this time provider aware

## 2023-08-10 NOTE — H&P (Signed)
 = History and Physical    MACKINSEY PELLAND HQI:[id number] DOB: 05-08-1961 DOA: 08/10/2023  Patient coming from: Home.  Chief Complaint: Abdominal pain.  HPI: Elizabeth Huber is a 63 y.o. female with history of diabetes mellitus type 2, hypertension, hyperlipidemia, morbid obesity, GERD presents to the ER with complaints of diffuse abdominal discomfort with nausea vomiting and diarrhea.  Patient states her symptoms have been ongoing for last 2 days.  She has not eaten well and has not taken her insulin in the last 24 hours.  Denies any recent travel or sick contacts.  ED Course: In the ER patient was requiring pain relief medications for the abdominal pain.  CT abdomen pelvis and ultrasound of the abdomen does not show anything acute.  Alkaline phosphatase was 128 AST ALT bilirubin and lipase are normal blood troponins negative EKG shows normal sinus rhythm WBC 12.8 UA unremarkable patient admitted for further observation given the persistent pain.  Review of Systems: As per HPI, rest all negative.   Past Medical History:  Diagnosis Date   Angina    Asthma    Chronic bronchitis    "have it whenever I have a cold"   Diabetes mellitus type 2 in obese    Dysrhythmia 08/03/11   "palpitations"   GERD (gastroesophageal reflux disease)    Headache(784.0)    Herpes simplex    "type 1 & 2"   High cholesterol    Hypertension    Phlebitis    Shortness of breath on exertion     Past Surgical History:  Procedure Laterality Date   CORONARY ANGIOGRAM N/A 08/05/2011   Procedure: CORONARY ANGIOGRAM;  Surgeon: Thurmon Fair, MD;  Location: [facility]ATH [facility];  Service: Cardiovascular;  Laterality: N/A;   SALPINGECTOMY     w/ovary removal; "right"   SALPINGOOPHORECTOMY     TOENAIL EXCISION       reports that she has never smoked. She has never used smokeless tobacco. She reports that she does not drink alcohol and does not use drugs.  Allergies  Allergen Reactions   Hydrocodone Other (See Comments)     Makes heart race   Oxycodone Anxiety   Amoxicillin-Pot Clavulanate     Other Reaction(s): tightness in chest/ stomach pains   Exenatide Nausea Only   Metformin Hcl Diarrhea    Other Reaction(s): stomach upset,    Morphine And Codeine Other (See Comments)    Gi upset   Tradjenta [Linagliptin] Anxiety   Vicodin [Hydrocodone-Acetaminophen] Palpitations    Family History  Problem Relation Age of Onset   Diabetes Mother    Hypertension Mother    CAD Father    Diabetes Sister    Healthy Brother    Breast cancer Neg Hx     Prior to Admission medications   Medication Sig Start Date End Date Taking? Authorizing Provider  acetaminophen (TYLENOL) 500 MG tablet Take 500 mg by mouth every 6 (six) hours as needed for moderate pain or headache.    Yes [provider]  aspirin 325 MG tablet Take 325 mg by mouth daily.   Yes [provider]  atenolol (TENORMIN) 50 MG tablet Take 50 mg by mouth daily.   Yes [provider]  atorvastatin (LIPITOR) 40 MG tablet Take 40 mg by mouth daily. 06/09/23  Yes [provider]  furosemide (LASIX) 40 MG tablet Take 1 tablet (40 mg total) by mouth daily. Patient taking differently: Take 40 mg by mouth 2 (two) times daily. 08/22/16  Yes [place],  Mikle Bosworth, MD  ibuprofen (ADVIL) 800 MG tablet Take 800 mg by mouth every 8 (eight) hours as needed. 10/26/20  Yes [provider]  insulin NPH-regular (NOVOLIN 70/30) (70-30) 100 UNIT/ML injection Inject 60 Units into the skin 2 (two) times daily with a meal.   Yes [provider]  loratadine (CLARITIN) 10 MG tablet Take 1 tablet (10 mg total) by mouth daily. 08/22/16  Yes Vassie Loll, MD  losartan (COZAAR) 25 MG tablet Take 25 mg by mouth daily.   Yes [provider]  Multiple Vitamins-Minerals (CENTRUM PO) Take 1 tablet by mouth daily.    Yes [provider]  methocarbamol (ROBAXIN) 500 MG tablet Take 1 tablet (500 mg total) by mouth every 8 (eight)  hours as needed for muscle spasms. Patient not taking: Reported on 06/25/2023 01/30/23   Glynn Octave, MD  omeprazole (PRILOSEC) 20 MG capsule Take 20 mg by mouth daily. Patient not taking: Reported on 08/10/2023    [provider]    Physical Exam: Constitutional: Moderately built and nourished. Vitals:   08/10/23 1800 08/10/23 1830 08/10/23 1831 08/10/23 1900  BP: (!) 138/50 (!) 142/70  (!) 114/53  Pulse: (!) 101 98  94  Resp: (!) 22 (!) 27  13  Temp:   98.1 F (36.7 C)   TempSrc:   Oral   SpO2: 97% 98%  95%   Eyes: Anicteric no pallor. ENMT: No discharge from the ears eyes nose or mouth.  No Neck: Mass felt.  No neck rigidity. Respiratory: No rhonchi or crepitations. Cardiovascular: S1-S2 heard. Abdomen: Soft nontender bowel sounds present. Musculoskeletal: No edema. Skin: No rash. Neurologic: Alert awake oriented to time place and person.  Moves all extremities. Psychiatric: Appears normal.  Normal affect.   Labs on Admission: I have personally reviewed following labs and imaging studies  CBC: Recent Labs  Lab 08/10/23 1121  WBC 12.8*  NEUTROABS 12.0*  HGB 14.1  HCT 42.1  MCV 86.6  PLT 312   Basic Metabolic Panel: Recent Labs  Lab 08/10/23 1121  NA 137  K 3.6  CL 99  CO2 28  GLUCOSE 215*  BUN 15  CREATININE 0.83  CALCIUM 8.5*   GFR: CrCl cannot be calculated (Unknown ideal weight.). Liver Function Tests: Recent Labs  Lab 08/10/23 1121  AST 17  ALT 15  ALKPHOS 128*  BILITOT 0.8  PROT 7.3  ALBUMIN 3.5   Recent Labs  Lab 08/10/23 1121  LIPASE 21   No results for input(s): "AMMONIA" in the last 168 hours. Coagulation Profile: No results for input(s): "INR", "PROTIME" in the last 168 hours. Cardiac Enzymes: No results for input(s): "CKTOTAL", "CKMB", "CKMBINDEX", "TROPONINI" in the last 168 hours. BNP (last 3 results) No results for input(s): "PROBNP" in the last 8760 hours. HbA1C: No results for input(s): "HGBA1C" in the last  72 hours. CBG: No results for input(s): "GLUCAP" in the last 168 hours. Lipid Profile: No results for input(s): "CHOL", "HDL", "LDLCALC", "TRIG", "CHOLHDL", "LDLDIRECT" in the last 72 hours. Thyroid Function Tests: No results for input(s): "TSH", "T4TOTAL", "FREET4", "T3FREE", "THYROIDAB" in the last 72 hours. Anemia Panel: No results for input(s): "VITAMINB12", "FOLATE", "FERRITIN", "TIBC", "IRON", "RETICCTPCT" in the last 72 hours. Urine analysis:    Component Value Date/Time   COLORURINE YELLOW 08/10/2023 1616   APPEARANCEUR HAZY (A) 08/10/2023 1616   LABSPEC 1.044 (H) 08/10/2023 1616   PHURINE 5.0 08/10/2023 1616   GLUCOSEU NEGATIVE 08/10/2023 1616   HGBUR NEGATIVE 08/10/2023 1616   BILIRUBINUR  NEGATIVE 08/10/2023 1616   KETONESUR 5 (A) 08/10/2023 1616   PROTEINUR NEGATIVE 08/10/2023 1616   UROBILINOGEN 1.0 03/11/2015 1236   NITRITE NEGATIVE 08/10/2023 1616   LEUKOCYTESUR NEGATIVE 08/10/2023 1616   Sepsis Labs: @LABRCNTIP (procalcitonin:4,lacticidven:4) )No results found for this or any previous visit (from the past 240 hours).   Radiological Exams on Admission: CT ABDOMEN PELVIS W CONTRAST Result Date: 08/10/2023 CLINICAL DATA:  Generalized abdominal pain with nausea, vomiting, and diarrhea since yesterday. EXAM: CT ABDOMEN AND PELVIS WITH CONTRAST TECHNIQUE: Multidetector CT imaging of the abdomen and pelvis was performed using the standard protocol following bolus administration of intravenous contrast. RADIATION DOSE REDUCTION: This exam was performed according to the departmental dose-optimization program which includes automated exposure control, adjustment of the mA and/or kV according to patient size and/or use of iterative reconstruction technique. CONTRAST:  OMNIPAQUE IOHEXOL 300 MG/ML  SOLN COMPARISON:  Right upper quadrant ultrasound from same day. CT abdomen pelvis dated January 30, 2023. FINDINGS: Lower chest: No acute abnormality. Hepatobiliary: No focal liver  abnormality is seen. No gallstones, gallbladder wall thickening, or biliary dilatation. Pancreas: Unremarkable. No pancreatic ductal dilatation or surrounding inflammatory changes. Spleen: Normal in size without focal abnormality. Adrenals/Urinary Tract: Adrenal glands are unremarkable. Kidneys are normal, without renal calculi, focal lesion, or hydronephrosis. Bladder is unremarkable. Stomach/Bowel: Stomach is within normal limits. Appendix appears normal. No evidence of bowel wall thickening, distention, or inflammatory changes. Vascular/Lymphatic: Aortic atherosclerosis. No enlarged abdominal or pelvic lymph nodes. Reproductive: Uterus and bilateral adnexa are unremarkable. Appropriately positioned IUD. Other: No free fluid or pneumoperitoneum. Musculoskeletal: No acute or significant osseous findings. IMPRESSION: 1. No acute intra-abdominal process. Electronically Signed   By: Obie Dredge M.D.   On: 08/10/2023 18:01   US Abdomen Limited Result Date: 08/10/2023 CLINICAL DATA:  ruq pain. EXAM: ULTRASOUND ABDOMEN LIMITED RIGHT UPPER QUADRANT COMPARISON:  None Available. FINDINGS: The technologist noted technically difficult exam due to patient's body habitus. Gallbladder: No gallstones or wall thickening visualized. No sonographic Murphy sign noted by sonographer. Common bile duct: Common bile duct was not distinctly visualized. However, no intrahepatic bile duct dilation. Liver: No focal lesion identified. Within normal limits in parenchymal echogenicity. Portal vein is patent on color Doppler imaging with normal direction of blood flow towards the liver. Other: None. IMPRESSION: *No sonographic evidence of acute cholecystitis. No intrahepatic bile duct dilation. Electronically Signed   By: Jules Schick M.D.   On: 08/10/2023 13:06    EKG: Independently reviewed.  Normal sinus rhythm.  Assessment/Plan Principal Problem:   Abdominal pain Active Problems:   CAD (coronary artery disease) 40% LAD afte  Dx1 08/05/11   Diabetes mellitus (HCC)   Hyperlipidemia,    HTN (hypertension)   GERD (gastroesophageal reflux disease)   Chronic diastolic HF (heart failure) (HCC)    Abdominal pain and nausea vomiting and diarrhea likely could be gastroenteritis CT scan of the abdomen pelvis and ultrasound does not show anything acute.  If there is any further episodes of diarrhea will check GI pathogen panel.  Denies any recent use of antibiotics or sick contacts.  Advance diet as tolerated. Diabetes mellitus type 2 last hemoglobin A1c in June 2024 in Care Everywhere was 6.  Uses NovoLog 70/30 60 units twice daily for now since patient's intake is not reliable  reduced to 20 units twice daily.  Follow CBGs closely. Hypertension on atenolol and losartan. Hyperlipidemia on statins. GERD on PPI.   Since patient has persistent abdominal pain requiring pain relief medications and  also unreliable intake will need close monitoring and more than 2 midnight stay.   DVT prophylaxis: Lovenox. Code Status: Full code. Family Communication: Discussed with patient. Disposition Plan: Medical floor. Consults called: None. Admission status: Observation.

## 2023-08-10 NOTE — ED Notes (Signed)
 Patient desat after receiving pain meds went to 77% RA RN repositioned patient and applied O2 via [place] on 2 L

## 2023-08-10 NOTE — ED Notes (Signed)
 Called lab to add on drug screen to urine in the lab. Per lab task will be completed

## 2023-08-10 NOTE — ED Triage Notes (Signed)
 Pt arrives via [facility] from home for generalized abd pain with N/V/D since yesterday. Pt given 100 mL NS and 4 mg zofran enroute. VSS with EMS.

## 2023-08-10 NOTE — ED Notes (Signed)
 ED TO INPATIENT HANDOFF REPORT  Name/Age/Gender Elizabeth Huber 63 y.o. female  Code Status Code Status History     Date Active Date Inactive Code Status Order ID Comments User Context   08/19/2016 2120 08/21/2016 2233 Full Code [id number]  Michael Litter, MD ED   08/04/2011 1447 08/06/2011 2016 Full Code [id number]  Georgette Dover, RN Inpatient       Home/SNF/Other Home  Chief Complaint Abdominal pain [R10.9]  Level of Care/Admitting Diagnosis ED Disposition     ED Disposition  Admit   Condition  --   Comment  Hospital Area: [facility] [[id number]]  Level of Care: Med-Surg [16]  May place patient in observation at [facility] or [facility] if equivalent level of care is available:: Yes  Covid Evaluation: Asymptomatic - no recent exposure (last 10 days) testing not required  Diagnosis: Abdominal pain [332951]  Admitting Physician: Eduard Clos [[id number]]  Attending Physician: Eduard Clos [phone]          Medical History Past Medical History:  Diagnosis Date   Angina    Asthma    Chronic bronchitis    "have it whenever I have a cold"   Diabetes mellitus type 2 in obese    Dysrhythmia 08/03/11   "palpitations"   GERD (gastroesophageal reflux disease)    Headache(784.0)    Herpes simplex    "type 1 & 2"   High cholesterol    Hypertension    Phlebitis    Shortness of breath on exertion     Allergies Allergies  Allergen Reactions   Hydrocodone Other (See Comments)    Makes heart race   Oxycodone Anxiety   Amoxicillin-Pot Clavulanate     Other Reaction(s): tightness in chest/ stomach pains   Exenatide Nausea Only   Metformin Hcl Diarrhea    Other Reaction(s): stomach upset,    Morphine And Codeine Other (See Comments)    Gi upset   Tradjenta [Linagliptin] Anxiety   Vicodin [Hydrocodone-Acetaminophen] Palpitations    IV Location/Drains/Wounds Patient Lines/Drains/Airways Status     Active Line/Drains/Airways     Name  Placement date Placement time Site Days   Peripheral IV 08/10/23 20 G 1" Right Antecubital 08/10/23  1122  Antecubital  less than 1            Labs/Imaging Results for orders placed or performed during the hospital encounter of 08/10/23 (from the past 48 hours)  CBC with Differential/Platelet     Status: Abnormal   Collection Time: 08/10/23 11:21 AM  Result Value Ref Range   WBC 12.8 (H) 4.0 - 10.5 K/uL   RBC 4.86 3.87 - 5.11 MIL/uL   Hemoglobin 14.1 12.0 - 15.0 g/dL   HCT 30.1 60.1 - 09.3 %   MCV 86.6 80.0 - 100.0 fL   MCH 29.0 26.0 - 34.0 pg   MCHC 33.5 30.0 - 36.0 g/dL   RDW 23.5 57.3 - 22.0 %   Platelets 312 150 - 400 K/uL   nRBC 0.0 0.0 - 0.2 %   Neutrophils Relative % 93 %   Neutro Abs 12.0 (H) 1.7 - 7.7 K/uL   Lymphocytes Relative 3 %   Lymphs Abs 0.4 (L) 0.7 - 4.0 K/uL   Monocytes Relative 3 %   Monocytes Absolute 0.4 0.1 - 1.0 K/uL   Eosinophils Relative 0 %   Eosinophils Absolute 0.0 0.0 - 0.5 K/uL   Basophils Relative 0 %   Basophils Absolute 0.0 0.0 - 0.1 K/uL  Immature Granulocytes 1 %   Abs Immature Granulocytes 0.07 0.00 - 0.07 K/uL    Comment: Performed at [facility], [address] [place], [place] [address]  Comprehensive metabolic panel     Status: Abnormal   Collection Time: 08/10/23 11:21 AM  Result Value Ref Range   Sodium 137 135 - 145 mmol/L   Potassium 3.6 3.5 - 5.1 mmol/L   Chloride 99 98 - 111 mmol/L   CO2 28 22 - 32 mmol/L   Glucose, Bld 215 (H) 70 - 99 mg/dL    Comment: Glucose reference range applies only to samples taken after fasting for at least 8 hours.   BUN 15 8 - 23 mg/dL   Creatinine, Ser 6.04 0.44 - 1.00 mg/dL   Calcium 8.5 (L) 8.9 - 10.3 mg/dL   Total Protein 7.3 6.5 - 8.1 g/dL   Albumin 3.5 3.5 - 5.0 g/dL   AST 17 15 - 41 U/L   ALT 15 0 - 44 U/L   Alkaline Phosphatase 128 (H) 38 - 126 U/L   Total Bilirubin 0.8 0.0 - 1.2 mg/dL   GFR, Estimated >54 >09 mL/min    Comment: (NOTE) Calculated using the  CKD-EPI Creatinine Equation (2021)    Anion gap 10 5 - 15    Comment: Performed at [facility], [address] [place], [place] [address]  Lipase, blood     Status: None   Collection Time: 08/10/23 11:21 AM  Result Value Ref Range   Lipase 21 11 - 51 U/L    Comment: Performed at [facility], [address], [place], [place] [address]  Urinalysis, Routine w reflex microscopic -Urine, Clean Catch     Status: Abnormal   Collection Time: 08/10/23  4:16 PM  Result Value Ref Range   Color, Urine YELLOW YELLOW   APPearance HAZY (A) CLEAR   Specific Gravity, Urine 1.044 (H) 1.005 - 1.030   pH 5.0 5.0 - 8.0   Glucose, UA NEGATIVE NEGATIVE mg/dL   Hgb urine dipstick NEGATIVE NEGATIVE   Bilirubin Urine NEGATIVE NEGATIVE   Ketones, ur 5 (A) NEGATIVE mg/dL   Protein, ur NEGATIVE NEGATIVE mg/dL   Nitrite NEGATIVE NEGATIVE   Leukocytes,Ua NEGATIVE NEGATIVE    Comment: Performed at [facility], [address] [place] [address] [place], [place] [address]  Rapid urine drug screen (hospital performed)     Status: None   Collection Time: 08/10/23  4:16 PM  Result Value Ref Range   Opiates NONE DETECTED NONE DETECTED   Cocaine NONE DETECTED NONE DETECTED   Benzodiazepines NONE DETECTED NONE DETECTED   Amphetamines NONE DETECTED NONE DETECTED   Tetrahydrocannabinol NONE DETECTED NONE DETECTED   Barbiturates NONE DETECTED NONE DETECTED    Comment: (NOTE) DRUG SCREEN FOR MEDICAL PURPOSES ONLY.  IF CONFIRMATION IS NEEDED FOR ANY PURPOSE, NOTIFY LAB WITHIN 5 DAYS.  LOWEST DETECTABLE LIMITS FOR URINE DRUG SCREEN Drug Class                     Cutoff (ng/mL) Amphetamine and metabolites    1000 Barbiturate and metabolites    200 Benzodiazepine                 200 Opiates and metabolites        300 Cocaine and metabolites        300 THC  50 Performed at [facility], [address], [place], [place]  [address]   Troponin I (High Sensitivity)     Status: None   Collection Time: 08/10/23  6:53 PM  Result Value Ref Range   Troponin I (High Sensitivity) 2 <18 ng/L    Comment: (NOTE) Elevated high sensitivity troponin I (hsTnI) values and significant  changes across serial measurements may suggest ACS but many other  chronic and acute conditions are known to elevate hsTnI results.  Refer to the "Links" section for chest pain algorithms and additional  guidance. Performed at [facility], [address], [place], [place] [address]   Troponin I (High Sensitivity)     Status: None   Collection Time: 08/10/23  8:39 PM  Result Value Ref Range   Troponin I (High Sensitivity) 3 <18 ng/L    Comment: (NOTE) Elevated high sensitivity troponin I (hsTnI) values and significant  changes across serial measurements may suggest ACS but many other  chronic and acute conditions are known to elevate hsTnI results.  Refer to the "Links" section for chest pain algorithms and additional  guidance. Performed at [facility], [address], [place], [place] [address]    CT ABDOMEN PELVIS W CONTRAST Result Date: 08/10/2023 CLINICAL DATA:  Generalized abdominal pain with nausea, vomiting, and diarrhea since yesterday. EXAM: CT ABDOMEN AND PELVIS WITH CONTRAST TECHNIQUE: Multidetector CT imaging of the abdomen and pelvis was performed using the standard protocol following bolus administration of intravenous contrast. RADIATION DOSE REDUCTION: This exam was performed according to the departmental dose-optimization program which includes automated exposure control, adjustment of the mA and/or kV according to patient size and/or use of iterative reconstruction technique. CONTRAST:  OMNIPAQUE IOHEXOL 300 MG/ML  SOLN COMPARISON:  Right upper quadrant ultrasound from same day. CT abdomen pelvis dated January 30, 2023. FINDINGS: Lower chest: No acute abnormality. Hepatobiliary:  No focal liver abnormality is seen. No gallstones, gallbladder wall thickening, or biliary dilatation. Pancreas: Unremarkable. No pancreatic ductal dilatation or surrounding inflammatory changes. Spleen: Normal in size without focal abnormality. Adrenals/Urinary Tract: Adrenal glands are unremarkable. Kidneys are normal, without renal calculi, focal lesion, or hydronephrosis. Bladder is unremarkable. Stomach/Bowel: Stomach is within normal limits. Appendix appears normal. No evidence of bowel wall thickening, distention, or inflammatory changes. Vascular/Lymphatic: Aortic atherosclerosis. No enlarged abdominal or pelvic lymph nodes. Reproductive: Uterus and bilateral adnexa are unremarkable. Appropriately positioned IUD. Other: No free fluid or pneumoperitoneum. Musculoskeletal: No acute or significant osseous findings. IMPRESSION: 1. No acute intra-abdominal process. Electronically Signed   By: Obie Dredge M.D.   On: 08/10/2023 18:01   US Abdomen Limited Result Date: 08/10/2023 CLINICAL DATA:  ruq pain. EXAM: ULTRASOUND ABDOMEN LIMITED RIGHT UPPER QUADRANT COMPARISON:  None Available. FINDINGS: The technologist noted technically difficult exam due to patient's body habitus. Gallbladder: No gallstones or wall thickening visualized. No sonographic Murphy sign noted by sonographer. Common bile duct: Common bile duct was not distinctly visualized. However, no intrahepatic bile duct dilation. Liver: No focal lesion identified. Within normal limits in parenchymal echogenicity. Portal vein is patent on color Doppler imaging with normal direction of blood flow towards the liver. Other: None. IMPRESSION: *No sonographic evidence of acute cholecystitis. No intrahepatic bile duct dilation. Electronically Signed   By: Jules Schick M.D.   On: 08/10/2023 13:06    Pending Labs Unresulted Labs (From admission, onward)    None       Vitals/Pain Today's Vitals   08/10/23 1830 08/10/23  1831 08/10/23 1900  08/10/23 2031  BP: (!) 142/70  (!) 114/53   Pulse: 98  94   Resp: (!) 27  13   Temp:  98.1 F (36.7 C)    TempSrc:  Oral    SpO2: 98%  95%   PainSc:    10-Worst pain ever    Isolation Precautions No active isolations  Medications Medications  0.9 %  sodium chloride infusion (0 mLs Intravenous Stopped 08/10/23 1328)  sodium chloride 0.9 % bolus 1,000 mL (0 mLs Intravenous Stopped 08/10/23 1227)  fentaNYL (SUBLIMAZE) injection 100 mcg (100 mcg Intravenous Given 08/10/23 1122)  fentaNYL (SUBLIMAZE) injection 100 mcg (100 mcg Intravenous Given 08/10/23 1329)  iohexol (OMNIPAQUE) 300 MG/ML solution 100 mL (100 mLs Intravenous Contrast Given 08/10/23 1535)  fentaNYL (SUBLIMAZE) injection 50 mcg (50 mcg Intravenous Given 08/10/23 1637)  ondansetron (ZOFRAN) injection 4 mg (4 mg Intravenous Given 08/10/23 1821)  pantoprazole (PROTONIX) injection 40 mg (40 mg Intravenous Given 08/10/23 1936)  fentaNYL (SUBLIMAZE) injection 50 mcg (50 mcg Intravenous Given 08/10/23 2031)    Mobility walks with person assist

## 2023-08-10 NOTE — ED Provider Notes (Signed)
 4:47 PM Assumed care of patient from off-going team. For more details, please see note from same day.  In brief, this is a 63 y.o. female with severe epigastric abd pain. RUQ [place] neg. Received multiple doses of IV fentanyl and desatted, required O2 for a short period of time but now off oxygen. WBC 12.8 but LFTs okay, BMP okay.   Plan/Dispo at time of sign-out & ED Course since sign-out: [ ]  CT abd pelvis [ ]  UA  BP 123/72   Pulse 96   Temp 97.8 F (36.6 C)   Resp 18   LMP 04/22/2011   SpO2 92%    ED Course:   Clinical Course as of 08/10/23 2148  Mon Aug 10, 2023  1736 Urinalysis, Routine w reflex microscopic -Urine, Clean Catch(!) High spec grav, mild ketonuria, likely dehydrated but otherwise no infection [HN]  1807 CT ABDOMEN PELVIS W CONTRAST 1. No acute intra-abdominal process. [HN]  1921 Patient with continued severe pain in epigastric region, 8/10. She has never had pain like this before. Chart review shows that she has never presented to ED with pain similar to this. She states she is not taking any weight loss injections and has never had abdominal surgery before. She is belching on exam and notes a significant amount of "indigestion." Will give protonix for possible gastritis though not demonstrated on CT as well as obtain troponin to r/o atypical ACS.  [HN]  1941 Troponin I (High Sensitivity): 2 neg [HN]    Clinical Course User Index [HN] Loetta Rough, MD   No h/o CHS or marijuana use, UDS negative for THC.     Dispo: Admit to medicine for intractable abdominal pain ------------------------------- Vivi Barrack, MD Emergency Medicine  This note was created using dictation software, which may contain spelling or grammatical errors.   Loetta Rough, MD 08/10/23 [phone]

## 2023-08-10 NOTE — Progress Notes (Signed)
 MD Toniann Fail was secured chatted because patient is asking for pain medication, no PRN ordered.

## 2023-08-11 DIAGNOSIS — R1013 Epigastric pain: Secondary | ICD-10-CM

## 2023-08-11 LAB — CBC
HCT: 37.8 % (ref 36.0–46.0)
Hemoglobin: 12.5 g/dL (ref 12.0–15.0)
MCH: 29.1 pg (ref 26.0–34.0)
MCHC: 33.1 g/dL (ref 30.0–36.0)
MCV: 87.9 fL (ref 80.0–100.0)
Platelets: 265 10*3/uL (ref 150–400)
RBC: 4.3 MIL/uL (ref 3.87–5.11)
RDW: 12.7 % (ref 11.5–15.5)
WBC: 7 10*3/uL (ref 4.0–10.5)
nRBC: 0 % (ref 0.0–0.2)

## 2023-08-11 LAB — COMPREHENSIVE METABOLIC PANEL
ALT: 20 U/L (ref 0–44)
AST: 54 U/L — ABNORMAL HIGH (ref 15–41)
Albumin: 3.1 g/dL — ABNORMAL LOW (ref 3.5–5.0)
Alkaline Phosphatase: 98 U/L (ref 38–126)
Anion gap: 6 (ref 5–15)
BUN: 12 mg/dL (ref 8–23)
CO2: 25 mmol/L (ref 22–32)
Calcium: 8.1 mg/dL — ABNORMAL LOW (ref 8.9–10.3)
Chloride: 103 mmol/L (ref 98–111)
Creatinine, Ser: 0.81 mg/dL (ref 0.44–1.00)
GFR, Estimated: >60 mL/min (ref >60)
Glucose, Bld: 152 mg/dL — ABNORMAL HIGH (ref 70–99)
Potassium: 3.2 mmol/L — ABNORMAL LOW (ref 3.5–5.1)
Sodium: 134 mmol/L — ABNORMAL LOW (ref 135–145)
Total Bilirubin: 0.7 mg/dL (ref 0.0–1.2)
Total Protein: 6.3 g/dL — ABNORMAL LOW (ref 6.5–8.1)

## 2023-08-11 LAB — HIV ANTIBODY (ROUTINE TESTING W REFLEX): HIV Screen 4th Generation wRfx: NONREACTIVE

## 2023-08-11 LAB — GLUCOSE, CAPILLARY
Glucose-Capillary: 114 mg/dL — ABNORMAL HIGH (ref 70–99)
Glucose-Capillary: 141 mg/dL — ABNORMAL HIGH (ref 70–99)
Glucose-Capillary: 142 mg/dL — ABNORMAL HIGH (ref 70–99)
Glucose-Capillary: 144 mg/dL — ABNORMAL HIGH (ref 70–99)

## 2023-08-11 MED ORDER — ATENOLOL 25 MG PO TABS
25.0000 mg | ORAL_TABLET | Freq: Every day | ORAL | Status: DC
  Administered 2023-08-11 – 2023-08-12 (×2): 25 mg via ORAL
  Filled 2023-08-11 (×2): qty 1

## 2023-08-11 MED ORDER — INSULIN ASPART PROT & ASPART (70-30 MIX) 100 UNIT/ML ~~LOC~~ SUSP
20.0000 [IU] | Freq: Two times a day (BID) | SUBCUTANEOUS | Status: DC
  Filled 2023-08-11: qty 10

## 2023-08-11 MED ORDER — SIMETHICONE 80 MG PO CHEW
80.0000 mg | CHEWABLE_TABLET | Freq: Four times a day (QID) | ORAL | Status: DC | PRN
  Administered 2023-08-11 – 2023-08-12 (×3): 80 mg via ORAL
  Filled 2023-08-11 (×3): qty 1

## 2023-08-11 MED ORDER — PANTOPRAZOLE SODIUM 40 MG PO TBEC
40.0000 mg | DELAYED_RELEASE_TABLET | Freq: Two times a day (BID) | ORAL | Status: DC
  Administered 2023-08-11 – 2023-08-12 (×3): 40 mg via ORAL
  Filled 2023-08-11 (×3): qty 1

## 2023-08-11 MED ORDER — FENTANYL CITRATE PF 50 MCG/ML IJ SOSY
25.0000 ug | PREFILLED_SYRINGE | INTRAMUSCULAR | Status: DC | PRN
  Administered 2023-08-11: 25 ug via INTRAVENOUS
  Filled 2023-08-11: qty 1

## 2023-08-11 MED ORDER — ENOXAPARIN SODIUM 80 MG/0.8ML IJ SOSY
0.5000 mg/kg | PREFILLED_SYRINGE | Freq: Every day | INTRAMUSCULAR | Status: DC
  Administered 2023-08-11: 80 mg via SUBCUTANEOUS
  Filled 2023-08-11: qty 0.8

## 2023-08-11 MED ORDER — POTASSIUM CHLORIDE CRYS ER 20 MEQ PO TBCR
40.0000 meq | EXTENDED_RELEASE_TABLET | Freq: Once | ORAL | Status: AC
  Administered 2023-08-11: 40 meq via ORAL
  Filled 2023-08-11: qty 2

## 2023-08-11 NOTE — Plan of Care (Signed)
   Problem: Education: Goal: Knowledge of General Education information will improve Description Including pain rating scale, medication(s)/side effects and non-pharmacologic comfort measures Outcome: Progressing   Problem: Health Behavior/Discharge Planning: Goal: Ability to manage health-related needs will improve Outcome: Progressing

## 2023-08-11 NOTE — Progress Notes (Signed)
 PROGRESS NOTE    Elizabeth Huber  WUJ:[id number] DOB: [birth year] DOA: 08/10/2023 PCP: Elias Else, MD   Brief Narrative: 86 past medical history significant for diabetes type 2, hypertension, hyperlipidemia, morbid obesity, GERD presented to the ER complaining of abdominal pain, nausea vomiting and diarrhea.  Symptoms started 2 days ago.  CT abdomen and pelvis and ultrasound does not show anything acute.  AST ALT mildly elevated, bilirubin and lipase normal.  White blood cell 12.  Patient admitted for further observation   Assessment & Plan:   Principal Problem:   Abdominal pain Active Problems:   CAD (coronary artery disease) 40% LAD afte Dx1 08/05/11   Diabetes mellitus (HCC)   Hyperlipidemia,    HTN (hypertension)   GERD (gastroesophageal reflux disease)   Chronic diastolic HF (heart failure) (HCC)  1-Abdominal pain, Nausea Vomiting, Diarrhea: -Likely gastroenteritis -CT abdomen and pelvis: No acute intra-abdominal pathology -Ultrasound:No sonographic evidence of acute cholecystitis. No intrahepatic bile duct dilation. -Report no further diarrhea or vomiting.  -She is feeling a little better, try her first meal this am.  -Plan to monitor overnight to make sure she tolerates diet.   Diabetes type 2 -SSI for now.  Resume NPH tomorrow if tolerating diet.   Hypertension:  -Continue with Atenolol, lower dose to avoid hypotension -Hold Cozaar until we can make sure she is tolerating diet.   Hyperlipidemia -Continue with Lipitor.   GERD -Continue with PPI.   Hypokalemia Replaced orally.     Estimated body mass index is 61.45 kg/m as calculated from the following:   Height as of this encounter: 5\' 4"  (1.626 m).   Weight as of this encounter: 162.4 kg.   DVT prophylaxis: Lovenox Code Status: Full code Family Communication: Disposition Plan:  Status is: Observation The patient remains OBS appropriate and will d/c before 2 midnights. Discharge tomorrow if tolerates  diet.     Consultants:  None  Procedures:  None  Antimicrobials:  None  Subjective: She is sitting recliner, report improvement of abdominal pain, now only left LQ. Report no further vomiting, diarrhea resolved. Breakfast was flirt meal in days. She wants to make sure she tolerates diet before going home.   Objective: Vitals:   08/10/23 2344 08/11/23 0122 08/11/23 0228 08/11/23 0547  BP:  (!) 113/39 130/70 (!) 106/56  Pulse:  98 93 90  Resp:  18  16  Temp:  99.6 F (37.6 C)  99.4 F (37.4 C)  TempSrc:  Oral  Oral  SpO2:  99%  94%  Weight: (!) 162.4 kg     Height: 5\' 4"  (1.626 m)       Intake/Output Summary (Last 24 hours) at 08/11/2023 0718 Last data filed at 08/11/2023 0600 Gross per 24 hour  Intake 2558.64 ml  Output 600 ml  Net 1958.64 ml   Filed Weights   08/10/23 2344  Weight: (!) 162.4 kg    Examination:  General exam: Appears calm and comfortable  Respiratory system: Clear to auscultation. Respiratory effort normal. Cardiovascular system: S1 & S2 heard, RRR. No JVD, murmurs, rubs, gallops or clicks. No pedal edema. Gastrointestinal system: Abdomen is nondistended, soft and mild tender. No organomegaly or masses felt. Normal bowel sounds heard. Central nervous system: Alert and oriented. Extremities: Symmetric 5 x 5 power.   Data Reviewed: I have personally reviewed following labs and imaging studies  CBC: Recent Labs  Lab 08/10/23 1121 08/11/23 0458  WBC 12.8* 7.0  NEUTROABS 12.0*  --   HGB 14.1 12.5  HCT 42.1 37.8  MCV 86.6 87.9  PLT 312 265   Basic Metabolic Panel: Recent Labs  Lab 08/10/23 1121 08/11/23 0458  NA 137 134*  K 3.6 3.2*  CL 99 103  CO2 28 25  GLUCOSE 215* 152*  BUN 15 12  CREATININE 0.83 0.81  CALCIUM 8.5* 8.1*   GFR: Estimated Creatinine Clearance: 111.2 mL/min (by C-G formula based on SCr of 0.81 mg/dL). Liver Function Tests: Recent Labs  Lab 08/10/23 1121 08/11/23 0458  AST 17 54*  ALT 15 20  ALKPHOS  128* 98  BILITOT 0.8 0.7  PROT 7.3 6.3*  ALBUMIN 3.5 3.1*   Recent Labs  Lab 08/10/23 1121  LIPASE 21   No results for input(s): "AMMONIA" in the last 168 hours. Coagulation Profile: No results for input(s): "INR", "PROTIME" in the last 168 hours. Cardiac Enzymes: No results for input(s): "CKTOTAL", "CKMB", "CKMBINDEX", "TROPONINI" in the last 168 hours. BNP (last 3 results) No results for input(s): "PROBNP" in the last 8760 hours. HbA1C: No results for input(s): "HGBA1C" in the last 72 hours. CBG: Recent Labs  Lab 08/10/23 2327  GLUCAP 172*   Lipid Profile: No results for input(s): "CHOL", "HDL", "LDLCALC", "TRIG", "CHOLHDL", "LDLDIRECT" in the last 72 hours. Thyroid Function Tests: No results for input(s): "TSH", "T4TOTAL", "FREET4", "T3FREE", "THYROIDAB" in the last 72 hours. Anemia Panel: No results for input(s): "VITAMINB12", "FOLATE", "FERRITIN", "TIBC", "IRON", "RETICCTPCT" in the last 72 hours. Sepsis Labs: No results for input(s): "PROCALCITON", "LATICACIDVEN" in the last 168 hours.  No results found for this or any previous visit (from the past 240 hours).       Radiology Studies: CT ABDOMEN PELVIS W CONTRAST Result Date: 08/10/2023 CLINICAL DATA:  Generalized abdominal pain with nausea, vomiting, and diarrhea since yesterday. EXAM: CT ABDOMEN AND PELVIS WITH CONTRAST TECHNIQUE: Multidetector CT imaging of the abdomen and pelvis was performed using the standard protocol following bolus administration of intravenous contrast. RADIATION DOSE REDUCTION: This exam was performed according to the departmental dose-optimization program which includes automated exposure control, adjustment of the mA and/or kV according to patient size and/or use of iterative reconstruction technique. CONTRAST:  OMNIPAQUE IOHEXOL 300 MG/ML  SOLN COMPARISON:  Right upper quadrant ultrasound from same day. CT abdomen pelvis dated January 30, 2023. FINDINGS: Lower chest: No acute  abnormality. Hepatobiliary: No focal liver abnormality is seen. No gallstones, gallbladder wall thickening, or biliary dilatation. Pancreas: Unremarkable. No pancreatic ductal dilatation or surrounding inflammatory changes. Spleen: Normal in size without focal abnormality. Adrenals/Urinary Tract: Adrenal glands are unremarkable. Kidneys are normal, without renal calculi, focal lesion, or hydronephrosis. Bladder is unremarkable. Stomach/Bowel: Stomach is within normal limits. Appendix appears normal. No evidence of bowel wall thickening, distention, or inflammatory changes. Vascular/Lymphatic: Aortic atherosclerosis. No enlarged abdominal or pelvic lymph nodes. Reproductive: Uterus and bilateral adnexa are unremarkable. Appropriately positioned IUD. Other: No free fluid or pneumoperitoneum. Musculoskeletal: No acute or significant osseous findings. IMPRESSION: 1. No acute intra-abdominal process. Electronically Signed   By: Obie Dredge M.D.   On: 08/10/2023 18:01   US Abdomen Limited Result Date: 08/10/2023 CLINICAL DATA:  ruq pain. EXAM: ULTRASOUND ABDOMEN LIMITED RIGHT UPPER QUADRANT COMPARISON:  None Available. FINDINGS: The technologist noted technically difficult exam due to patient's body habitus. Gallbladder: No gallstones or wall thickening visualized. No sonographic Murphy sign noted by sonographer. Common bile duct: Common bile duct was not distinctly visualized. However, no intrahepatic bile duct dilation. Liver: No focal lesion identified. Within normal limits in parenchymal echogenicity. Portal  vein is patent on color Doppler imaging with normal direction of blood flow towards the liver. Other: None. IMPRESSION: *No sonographic evidence of acute cholecystitis. No intrahepatic bile duct dilation. Electronically Signed   By: Jules Schick M.D.   On: 08/10/2023 13:06        Scheduled Meds:  aspirin  325 mg Oral Daily   atenolol  50 mg Oral Daily   atorvastatin  40 mg Oral Daily    enoxaparin (LOVENOX) injection  40 mg Subcutaneous Q24H   insulin aspart  0-9 Units Subcutaneous TID WC   insulin aspart protamine- aspart  20 Units Subcutaneous BID WC   losartan  25 mg Oral Daily   Continuous Infusions:  sodium chloride 125 mL/hr at 08/10/23 2332     LOS: 0 days    Time spent: 35 minutes    Bryse Blanchette A Bert Ptacek, MD [facility]   If 7PM-7AM, please contact night-coverage [email or web address]  08/11/2023, 7:18 AM

## 2023-08-11 NOTE — Progress Notes (Signed)
CBG 144 

## 2023-08-11 NOTE — Plan of Care (Signed)

## 2023-08-11 NOTE — Progress Notes (Signed)
   08/11/23 1428  TOC Brief Assessment  Insurance and Status Reviewed  Patient has primary care physician Yes  Home environment has been reviewed home with father  Prior level of function: independent  Prior/Current Home Services No current home services  Social Drivers of Health Review SDOH reviewed no interventions necessary  Readmission risk has been reviewed Yes  Transition of care needs no transition of care needs at this time

## 2023-08-12 DIAGNOSIS — A084 Viral intestinal infection, unspecified: Secondary | ICD-10-CM

## 2023-08-12 LAB — BASIC METABOLIC PANEL
Anion gap: 6 (ref 5–15)
BUN: 11 mg/dL (ref 8–23)
CO2: 26 mmol/L (ref 22–32)
Calcium: 8.2 mg/dL — ABNORMAL LOW (ref 8.9–10.3)
Chloride: 103 mmol/L (ref 98–111)
Creatinine, Ser: 0.61 mg/dL (ref 0.44–1.00)
GFR, Estimated: >60 mL/min (ref >60)
Glucose, Bld: 134 mg/dL — ABNORMAL HIGH (ref 70–99)
Potassium: 3.5 mmol/L (ref 3.5–5.1)
Sodium: 135 mmol/L (ref 135–145)

## 2023-08-12 LAB — GLUCOSE, CAPILLARY
Glucose-Capillary: 150 mg/dL — ABNORMAL HIGH (ref 70–99)
Glucose-Capillary: 136 mg/dL — ABNORMAL HIGH (ref 70–99)

## 2023-08-12 MED ORDER — SENNOSIDES-DOCUSATE SODIUM 8.6-50 MG PO TABS: 1.0000 | ORAL_TABLET | Freq: Two times a day (BID) | ORAL | Status: DC | PRN

## 2023-08-12 MED ORDER — BISACODYL 10 MG RE SUPP
10.0000 mg | Freq: Once | RECTAL | Status: AC
  Administered 2023-08-12: 10 mg via RECTAL
  Filled 2023-08-12: qty 1

## 2023-08-12 MED ORDER — POLYETHYLENE GLYCOL 3350 17 G PO PACK
17.0000 g | PACK | Freq: Two times a day (BID) | ORAL | Status: DC
  Administered 2023-08-12: 17 g via ORAL
  Filled 2023-08-12: qty 1

## 2023-08-12 NOTE — Plan of Care (Signed)

## 2023-08-12 NOTE — Progress Notes (Signed)
 Patient was given discharge instructions by Ted Mcalpine, and all questions were answered. Patient was stable for discharge and was taken to the main exit by wheelchair.

## 2023-08-12 NOTE — Discharge Summary (Addendum)
 Physician Discharge Summary   Patient: Elizabeth Huber MRN: [id number] DOB: May 25, 1960  Admit date:     08/10/2023  Discharge date: 08/12/23  Discharge Physician: Tobey Grim   PCP: Elias Else, MD   Recommendations at discharge:   Patient should follow-up with PCP to ensure continued resolution of nausea, vomiting, abdominal pain  Discharge Diagnoses: Principal Problem:   Abdominal pain Active Problems:   CAD (coronary artery disease) 40% LAD afte Dx1 08/05/11   Diabetes mellitus (HCC)   Hyperlipidemia,    HTN (hypertension)   GERD (gastroesophageal reflux disease)   Chronic diastolic HF (heart failure) (HCC)  Resolved Problems:   * No resolved hospital problems. [facility] course: 63 year old female with past medical history significant for diabetes type 2, hypertension, hyperlipidemia, morbid obesity, GERD presented to the ER complaining of abdominal pain, nausea vomiting and diarrhea.  Symptoms started 2 days prior to admission.  CT abdomen and pelvis and ultrasound does not show anything acute.  AST ALT mildly elevated, bilirubin and lipase normal.  Labs all trended to normal including white blood cell count discharge.  Patient was admitted for further observation.  While in house nausea vomiting diarrhea completely resolved.  On day of discharge she was able to tolerate a normal diet.  She had a normal bowel movement.  She was discharged home 3/26.  Working etiology was viral gastroenteritis again completely resolved at discharge.     Assessment & Plan:   Principal Problem:   Abdominal pain Active Problems:   CAD (coronary artery disease) 40% LAD afte Dx1 08/05/11   Diabetes mellitus (HCC)   Hyperlipidemia,    HTN (hypertension)   GERD (gastroesophageal reflux disease)   Chronic diastolic HF (heart failure) (HCC)   1-Abdominal pain, Nausea Vomiting, Diarrhea: -Likely gastroenteritis -CT abdomen and pelvis: No acute intra-abdominal  pathology -Ultrasound:No sonographic evidence of acute cholecystitis. No intrahepatic bile duct dilation. -Report no further diarrhea or vomiting.  -She is feeling a little better, try her first meal this am.  Monitored overnight with diet and did well so therefore she was discharged home on 3/26   Diabetes type 2 -SSI for now.  Resume NPH at discharge   Hypertension:  -Continue with Atenolol, lower dose to avoid hypotension -Blood pressure meds resumed at discharge.   Hyperlipidemia -Continue with Lipitor.    GERD -Continue with PPI.    Hypokalemia Replaced orally.         Consultants: None Procedures performed: None Disposition: Home Diet recommendation:  Discharge Diet Orders (From admission, onward)     Start     Ordered   08/12/23 0000  Diet - low sodium heart healthy        08/12/23 1425           Heart healthy diet DISCHARGE MEDICATION: Allergies as of 08/12/2023       Reactions   Hydrocodone Other (See Comments)   Makes heart race   Oxycodone Anxiety   Amoxicillin-pot Clavulanate    Other Reaction(s): tightness in chest/ stomach pains   Exenatide Nausea Only   Metformin Hcl Diarrhea   Other Reaction(s): stomach upset,    Morphine And Codeine Other (See Comments)   Gi upset   Tradjenta [linagliptin] Anxiety   Vicodin [hydrocodone-acetaminophen] Palpitations        Medication List     TAKE these medications    acetaminophen 500 MG tablet Commonly known as: TYLENOL Take 500 mg by mouth every 6 (six) hours as needed for  moderate pain or headache.   aspirin 325 MG tablet Take 325 mg by mouth daily.   atenolol 50 MG tablet Commonly known as: TENORMIN Take 50 mg by mouth daily.   atorvastatin 40 MG tablet Commonly known as: LIPITOR Take 40 mg by mouth daily.   CENTRUM PO Take 1 tablet by mouth daily.   furosemide 40 MG tablet Commonly known as: LASIX Take 1 tablet (40 mg total) by mouth daily. What changed: when to take this    ibuprofen 800 MG tablet Commonly known as: ADVIL Take 800 mg by mouth every 8 (eight) hours as needed.   insulin NPH-regular Human (70-30) 100 UNIT/ML injection Inject 60 Units into the skin 2 (two) times daily with a meal.   loratadine 10 MG tablet Commonly known as: CLARITIN Take 1 tablet (10 mg total) by mouth daily.   losartan 25 MG tablet Commonly known as: COZAAR Take 25 mg by mouth daily.   methocarbamol 500 MG tablet Commonly known as: ROBAXIN Take 1 tablet (500 mg total) by mouth every 8 (eight) hours as needed for muscle spasms.   omeprazole 20 MG capsule Commonly known as: PRILOSEC Take 20 mg by mouth daily.        Discharge Exam: Filed Weights   08/10/23 2344  Weight: (!) 162.4 kg   General exam: Appears calm and comfortable  Respiratory system: Clear to auscultation. Respiratory effort normal. Cardiovascular system: S1 & S2 heard, RRR. No JVD, murmurs, rubs, gallops or clicks. No pedal edema. Gastrointestinal system: Abdomen is nondistended, soft and mild tender. No organomegaly or masses felt. Normal bowel sounds heard. Central nervous system: Alert and oriented. Extremities: Symmetric 5 x 5 power.  Condition at discharge: Good  The results of significant diagnostics from this hospitalization (including imaging, microbiology, ancillary and laboratory) are listed below for reference.   Imaging Studies: CT ABDOMEN PELVIS W CONTRAST Result Date: 08/10/2023 CLINICAL DATA:  Generalized abdominal pain with nausea, vomiting, and diarrhea since yesterday. EXAM: CT ABDOMEN AND PELVIS WITH CONTRAST TECHNIQUE: Multidetector CT imaging of the abdomen and pelvis was performed using the standard protocol following bolus administration of intravenous contrast. RADIATION DOSE REDUCTION: This exam was performed according to the departmental dose-optimization program which includes automated exposure control, adjustment of the mA and/or kV according to patient size and/or  use of iterative reconstruction technique. CONTRAST:  OMNIPAQUE IOHEXOL 300 MG/ML  SOLN COMPARISON:  Right upper quadrant ultrasound from same day. CT abdomen pelvis dated January 30, 2023. FINDINGS: Lower chest: No acute abnormality. Hepatobiliary: No focal liver abnormality is seen. No gallstones, gallbladder wall thickening, or biliary dilatation. Pancreas: Unremarkable. No pancreatic ductal dilatation or surrounding inflammatory changes. Spleen: Normal in size without focal abnormality. Adrenals/Urinary Tract: Adrenal glands are unremarkable. Kidneys are normal, without renal calculi, focal lesion, or hydronephrosis. Bladder is unremarkable. Stomach/Bowel: Stomach is within normal limits. Appendix appears normal. No evidence of bowel wall thickening, distention, or inflammatory changes. Vascular/Lymphatic: Aortic atherosclerosis. No enlarged abdominal or pelvic lymph nodes. Reproductive: Uterus and bilateral adnexa are unremarkable. Appropriately positioned IUD. Other: No free fluid or pneumoperitoneum. Musculoskeletal: No acute or significant osseous findings. IMPRESSION: 1. No acute intra-abdominal process. Electronically Signed   By: Obie Dredge M.D.   On: 08/10/2023 18:01   US Abdomen Limited Result Date: 08/10/2023 CLINICAL DATA:  ruq pain. EXAM: ULTRASOUND ABDOMEN LIMITED RIGHT UPPER QUADRANT COMPARISON:  None Available. FINDINGS: The technologist noted technically difficult exam due to patient's body habitus. Gallbladder: No gallstones or wall thickening visualized. No  sonographic Eulah Pont sign noted by sonographer. Common bile duct: Common bile duct was not distinctly visualized. However, no intrahepatic bile duct dilation. Liver: No focal lesion identified. Within normal limits in parenchymal echogenicity. Portal vein is patent on color Doppler imaging with normal direction of blood flow towards the liver. Other: None. IMPRESSION: *No sonographic evidence of acute cholecystitis. No  intrahepatic bile duct dilation. Electronically Signed   By: Jules Schick M.D.   On: 08/10/2023 13:06    Microbiology: Results for orders placed or performed during the hospital encounter of 07/06/21  Resp Panel by RT-PCR (Flu A&B, Covid) Nasopharyngeal Swab     Status: None   Collection Time: 07/06/21 11:29 AM   Specimen: Nasopharyngeal Swab; Nasopharyngeal(NP) swabs in vial transport medium  Result Value Ref Range Status   SARS Coronavirus 2 by RT PCR NEGATIVE NEGATIVE Final    Comment: (NOTE) SARS-CoV-2 target nucleic acids are NOT DETECTED.  The SARS-CoV-2 RNA is generally detectable in upper respiratory specimens during the acute phase of infection. The lowest concentration of SARS-CoV-2 viral copies this assay can detect is 138 copies/mL. A negative result does not preclude SARS-Cov-2 infection and should not be used as the sole basis for treatment or other patient management decisions. A negative result may occur with  improper specimen collection/handling, submission of specimen other than nasopharyngeal swab, presence of viral mutation(s) within the areas targeted by this assay, and inadequate number of viral copies(<138 copies/mL). A negative result must be combined with clinical observations, patient history, and epidemiological information. The expected result is Negative.  Fact Sheet for Patients:  BloggerCourse.com  Fact Sheet for Healthcare Providers:  SeriousBroker.it  This test is no t yet approved or cleared by the [place] FDA and  has been authorized for detection and/or diagnosis of SARS-CoV-2 by FDA under an Emergency Use Authorization (EUA). This EUA will remain  in effect (meaning this test can be used) for the duration of the COVID-19 declaration under Section 564(b)(1) of the Act, 21 U.S.C.section 360bbb-3(b)(1), unless the authorization is terminated  or revoked sooner.       Influenza A by  PCR NEGATIVE NEGATIVE Final   Influenza B by PCR NEGATIVE NEGATIVE Final    Comment: (NOTE) The Xpert Xpress SARS-CoV-2/FLU/RSV plus assay is intended as an aid in the diagnosis of influenza from Nasopharyngeal swab specimens and should not be used as a sole basis for treatment. Nasal washings and aspirates are unacceptable for Xpert Xpress SARS-CoV-2/FLU/RSV testing.  Fact Sheet for Patients: BloggerCourse.com  Fact Sheet for Healthcare Providers: SeriousBroker.it  This test is not yet approved or cleared by the [place] FDA and has been authorized for detection and/or diagnosis of SARS-CoV-2 by FDA under an Emergency Use Authorization (EUA). This EUA will remain in effect (meaning this test can be used) for the duration of the COVID-19 declaration under Section 564(b)(1) of the Act, 21 U.S.C. section 360bbb-3(b)(1), unless the authorization is terminated or revoked.  Performed at [facility], [address], [place], [place] [address]     Labs: CBC: Recent Labs  Lab 08/10/23 1121 08/11/23 0458  WBC 12.8* 7.0  NEUTROABS 12.0*  --   HGB 14.1 12.5  HCT 42.1 37.8  MCV 86.6 87.9  PLT 312 265   Basic Metabolic Panel: Recent Labs  Lab 08/10/23 1121 08/11/23 0458 08/12/23 0509  NA 137 134* 135  K 3.6 3.2* 3.5  CL 99 103 103  CO2 28 25 26   GLUCOSE 215* 152* 134*  BUN 15 12 11   CREATININE 0.83 0.81 0.61  CALCIUM 8.5* 8.1* 8.2*   Liver Function Tests: Recent Labs  Lab 08/10/23 1121 08/11/23 0458  AST 17 54*  ALT 15 20  ALKPHOS 128* 98  BILITOT 0.8 0.7  PROT 7.3 6.3*  ALBUMIN 3.5 3.1*   CBG: Recent Labs  Lab 08/11/23 1134 08/11/23 1634 08/11/23 2032 08/12/23 0752 08/12/23 1144  GLUCAP 141* 142* 144* 150* 136*    Discharge time spent: less than 30 minutes.  Signed: Tobey Grim, MD [facility] 08/12/2023

## 2023-09-30 ENCOUNTER — Emergency Department (HOSPITAL_COMMUNITY)

## 2023-09-30 ENCOUNTER — Encounter (HOSPITAL_COMMUNITY): Payer: Self-pay

## 2023-09-30 ENCOUNTER — Other Ambulatory Visit: Payer: Self-pay

## 2023-09-30 ENCOUNTER — Emergency Department (HOSPITAL_COMMUNITY)
Admission: EM | Admit: 2023-09-30 | Discharge: 2023-09-30 | Disposition: A | Discharge status: Home / Self Care | Attending: Emergency Medicine | Admitting: Emergency Medicine

## 2023-09-30 DIAGNOSIS — Z79899 Other long term (current) drug therapy: Secondary | ICD-10-CM | POA: Diagnosis not present

## 2023-09-30 DIAGNOSIS — E119 Type 2 diabetes mellitus without complications: Secondary | ICD-10-CM | POA: Diagnosis not present

## 2023-09-30 DIAGNOSIS — J45909 Unspecified asthma, uncomplicated: Secondary | ICD-10-CM | POA: Diagnosis not present

## 2023-09-30 DIAGNOSIS — K0889 Other specified disorders of teeth and supporting structures: Secondary | ICD-10-CM

## 2023-09-30 DIAGNOSIS — R350 Frequency of micturition: Secondary | ICD-10-CM | POA: Insufficient documentation

## 2023-09-30 DIAGNOSIS — R109 Unspecified abdominal pain: Secondary | ICD-10-CM | POA: Diagnosis not present

## 2023-09-30 DIAGNOSIS — I10 Essential (primary) hypertension: Secondary | ICD-10-CM | POA: Diagnosis not present

## 2023-09-30 DIAGNOSIS — R002 Palpitations: Secondary | ICD-10-CM | POA: Diagnosis not present

## 2023-09-30 DIAGNOSIS — R519 Headache, unspecified: Secondary | ICD-10-CM | POA: Diagnosis not present

## 2023-09-30 DIAGNOSIS — R079 Chest pain, unspecified: Secondary | ICD-10-CM | POA: Insufficient documentation

## 2023-09-30 DIAGNOSIS — Z7982 Long term (current) use of aspirin: Secondary | ICD-10-CM | POA: Diagnosis not present

## 2023-09-30 DIAGNOSIS — Z794 Long term (current) use of insulin: Secondary | ICD-10-CM | POA: Diagnosis not present

## 2023-09-30 DIAGNOSIS — M5440 Lumbago with sciatica, unspecified side: Secondary | ICD-10-CM

## 2023-09-30 LAB — CBC WITH DIFFERENTIAL/PLATELET
Abs Immature Granulocytes: 0.04 10*3/uL (ref 0.00–0.07)
Basophils Absolute: 0 10*3/uL (ref 0.0–0.1)
Basophils Relative: 0 %
Eosinophils Absolute: 0.2 10*3/uL (ref 0.0–0.5)
Eosinophils Relative: 1 %
HCT: 39.4 % (ref 36.0–46.0)
Hemoglobin: 13.2 g/dL (ref 12.0–15.0)
Immature Granulocytes: 0 %
Lymphocytes Relative: 18 %
Lymphs Abs: 2.1 10*3/uL (ref 0.7–4.0)
MCH: 28.9 pg (ref 26.0–34.0)
MCHC: 33.5 g/dL (ref 30.0–36.0)
MCV: 86.2 fL (ref 80.0–100.0)
Monocytes Absolute: 0.8 10*3/uL (ref 0.1–1.0)
Monocytes Relative: 7 %
Neutro Abs: 8.6 10*3/uL — ABNORMAL HIGH (ref 1.7–7.7)
Neutrophils Relative %: 74 %
Platelets: 315 10*3/uL (ref 150–400)
RBC: 4.57 MIL/uL (ref 3.87–5.11)
RDW: 12.3 % (ref 11.5–15.5)
WBC: 11.7 10*3/uL — ABNORMAL HIGH (ref 4.0–10.5)
nRBC: 0 % (ref 0.0–0.2)

## 2023-09-30 LAB — COMPREHENSIVE METABOLIC PANEL WITH GFR
ALT: 15 U/L (ref 0–44)
AST: 17 U/L (ref 15–41)
Albumin: 3.2 g/dL — ABNORMAL LOW (ref 3.5–5.0)
Alkaline Phosphatase: 92 U/L (ref 38–126)
Anion gap: 12 (ref 5–15)
BUN: 20 mg/dL (ref 8–23)
CO2: 23 mmol/L (ref 22–32)
Calcium: 9 mg/dL (ref 8.9–10.3)
Chloride: 102 mmol/L (ref 98–111)
Creatinine, Ser: 0.85 mg/dL (ref 0.44–1.00)
GFR, Estimated: >60 mL/min (ref >60)
Glucose, Bld: 116 mg/dL — ABNORMAL HIGH (ref 70–99)
Potassium: 4.1 mmol/L (ref 3.5–5.1)
Sodium: 137 mmol/L (ref 135–145)
Total Bilirubin: 0.5 mg/dL (ref 0.0–1.2)
Total Protein: 5.6 g/dL — ABNORMAL LOW (ref 6.5–8.1)

## 2023-09-30 LAB — URINALYSIS, ROUTINE W REFLEX MICROSCOPIC
Bilirubin Urine: NEGATIVE
Glucose, UA: NEGATIVE mg/dL
Hgb urine dipstick: NEGATIVE
Ketones, ur: NEGATIVE mg/dL
Leukocytes,Ua: NEGATIVE
Nitrite: NEGATIVE
Protein, ur: NEGATIVE mg/dL
Specific Gravity, Urine: 1.006 (ref 1.005–1.030)
pH: 6 (ref 5.0–8.0)

## 2023-09-30 LAB — TSH: TSH: 1.605 u[IU]/mL (ref 0.350–4.500)

## 2023-09-30 LAB — CBG MONITORING, ED: Glucose-Capillary: 112 mg/dL — ABNORMAL HIGH (ref 70–99)

## 2023-09-30 LAB — TROPONIN I (HIGH SENSITIVITY)
Troponin I (High Sensitivity): 5 ng/L (ref <18)
Troponin I (High Sensitivity): 2 ng/L (ref <18)

## 2023-09-30 LAB — T4, FREE: Free T4: 0.97 ng/dL (ref 0.61–1.12)

## 2023-09-30 LAB — LIPASE, BLOOD: Lipase: 25 U/L (ref 11–51)

## 2023-09-30 LAB — RESP PANEL BY RT-PCR (RSV, FLU A&B, COVID)  RVPGX2
Influenza A by PCR: NEGATIVE
Influenza B by PCR: NEGATIVE
Resp Syncytial Virus by PCR: NEGATIVE
SARS Coronavirus 2 by RT PCR: NEGATIVE

## 2023-09-30 LAB — MAGNESIUM: Magnesium: 2.2 mg/dL (ref 1.7–2.4)

## 2023-09-30 LAB — D-DIMER, QUANTITATIVE: D-Dimer, Quant: <0.27 ug{FEU}/mL (ref 0.00–0.50)

## 2023-09-30 MED ORDER — KETOROLAC TROMETHAMINE 60 MG/2ML IM SOLN: 30.0000 mg | Freq: Once | INTRAMUSCULAR | Status: DC

## 2023-09-30 MED ORDER — KETOROLAC TROMETHAMINE 15 MG/ML IJ SOLN
15.0000 mg | Freq: Once | INTRAMUSCULAR | Status: AC
  Administered 2023-09-30: 15 mg via INTRAVENOUS
  Filled 2023-09-30: qty 1

## 2023-09-30 MED ORDER — ACETAMINOPHEN 500 MG PO TABS
1000.0000 mg | ORAL_TABLET | Freq: Once | ORAL | Status: AC
  Administered 2023-09-30: 1000 mg via ORAL
  Filled 2023-09-30: qty 2

## 2023-09-30 NOTE — ED Triage Notes (Signed)
 Patient arrives from home via [facility] for back pain, jaw pain, and headache. Back pain without fall with pain down legs. Alert and oriented. No dizziness or blurred vision. Stroke screen neg. Saw dentist on Monday for pain-given amoxicillin which per patient she is allergic to. Took amoxicillin since Monday.   VS BP 116 palp Hr 72 96 ON ROOM AIR CBG 120

## 2023-09-30 NOTE — ED Notes (Signed)
 Patient discharged by RN in wheelchair. Patient verbalizes understanding of instructions with no additional questions for RN. To ride home with father.

## 2023-09-30 NOTE — ED Notes (Signed)
 Lab called by RN to check on status, per [facility] analyzer is down and labs may be delayed.

## 2023-09-30 NOTE — ED Notes (Signed)
 Patient transported to X-ray

## 2023-09-30 NOTE — ED Provider Notes (Signed)
 [place] EMERGENCY DEPARTMENT AT [facility] Provider Note   CSN: [id number] Arrival date & time: 09/30/23  4098     History  Chief Complaint  Patient presents with   Back Pain   Dental Pain   Headache    Elizabeth Huber is a 63 y.o. female.   Back Pain Associated symptoms: chest pain and headaches   Associated symptoms: no fever   Dental Pain Associated symptoms: headaches   Associated symptoms: no fever   Headache Associated symptoms: no fever      63 year old female with medical history significant for HLD, asthma, DMT2, GERD, HSV 1 and 2, headaches, palpitations, HTN presenting to the emergency department with multiple complaints.  The patient states that she was recently diagnosed with an odontogenic infection with associated dental pain and bilaterally in the upper and lower molars on the left for which she was prescribed amoxicillin.  She denies any allergy to amoxicillin and has been taking the medication without difficulty.  She states that she continues to endorse pain in her jaw and her teeth, no swelling in her mouth, no swelling in her face or neck, no tongue elevation.  She has not taken anything for the pain.  She does additionally endorses a mild headache located diffusely across her head, not sudden onset or maximal in onset, no neck stiffness or rigidity, no fevers or chills.  She states that she has had pain in her flanks bilaterally and has been having some urinary frequency and is wondering whether she also is experiencing a urinary tract infection.  She denies any abdominal pain, nausea, vomiting, diarrhea.  She denies any recent falls or trauma.  She denies any midline back pain.  She denies any saddle anesthesia, focal numbness or weakness in any extremities.  No facial droop, difficulty swallowing, no dizziness or blurred vision.    Additionally, overnight, the patient woke up in the middle of the night with a sensation of heart palpitations with  associated chest discomfort, no cough.  No ripping or tearing chest pain, no radiation to the back, no chest pressure or heaviness.  No new lower extremity swelling, no shortness of breath.  Symptoms have completely resolved although concerned the patient enough to present to the emergency department for further evaluation.  Patient does state that she is scheduled for cardiac catheterization later this month.  Home Medications Prior to Admission medications   Medication Sig Start Date End Date Taking? Authorizing Provider  acetaminophen  (TYLENOL ) 500 MG tablet Take 500 mg by mouth every 6 (six) hours as needed for moderate pain or headache.     [provider]  aspirin  325 MG tablet Take 325 mg by mouth daily.    [provider]  atenolol  (TENORMIN ) 50 MG tablet Take 50 mg by mouth daily.    [provider]  atorvastatin  (LIPITOR) 40 MG tablet Take 40 mg by mouth daily. 06/09/23   [provider]  furosemide  (LASIX ) 40 MG tablet Take 1 tablet (40 mg total) by mouth daily. Patient taking differently: Take 40 mg by mouth 2 (two) times daily. 08/22/16   Justina Oman, MD  ibuprofen  (ADVIL ) 800 MG tablet Take 800 mg by mouth every 8 (eight) hours as needed. 10/26/20   [provider]  insulin  NPH-regular (NOVOLIN 70/30) (70-30) 100 UNIT/ML injection Inject 60 Units into the skin 2 (two) times daily with a meal.    [provider]  loratadine  (CLARITIN ) 10 MG tablet Take 1 tablet (10  mg total) by mouth daily. 08/22/16   Justina Oman, MD  losartan  (COZAAR ) 25 MG tablet Take 25 mg by mouth daily.    [provider]  methocarbamol  (ROBAXIN ) 500 MG tablet Take 1 tablet (500 mg total) by mouth every 8 (eight) hours as needed for muscle spasms. Patient not taking: Reported on 06/25/2023 01/30/23   Earma Gloss, MD  Multiple Vitamins-Minerals (CENTRUM PO) Take 1 tablet by mouth daily.     [provider]  omeprazole  (PRILOSEC) 20 MG capsule  Take 20 mg by mouth daily. Patient not taking: Reported on 08/10/2023    [provider]      Allergies    Hydrocodone, Oxycodone , Amoxicillin-pot clavulanate, Exenatide, Metformin hcl, Morphine  and codeine, Tradjenta [linagliptin], and Vicodin [hydrocodone-acetaminophen ]    Review of Systems   Review of Systems  Constitutional:  Negative for chills and fever.  HENT:  Positive for dental problem.   Respiratory:  Negative for shortness of breath.   Cardiovascular:  Positive for chest pain and palpitations. Negative for leg swelling.  Genitourinary:  Positive for flank pain and frequency.  Neurological:  Positive for headaches.  All other systems reviewed and are negative.   Physical Exam Updated Vital Signs BP 126/60 (BP Location: Right Arm)   Pulse 68   Temp 98.2 F (36.8 C) (Oral)   Resp 16   Ht 5\' 7"  (1.702 m)   Wt (!) 154.2 kg   LMP 04/22/2011   SpO2 97%   BMI 53.25 kg/m  Physical Exam Vitals and nursing note reviewed.  Constitutional:      General: She is not in acute distress.    Appearance: She is well-developed. She is obese.  HENT:     Head: Normocephalic and atraumatic.     Comments: Poor dentition, no evidence of odontogenic abscess, no trismus, no tongue elevation or swelling, no neck or jaw swelling. Eyes:     Conjunctiva/sclera: Conjunctivae normal.  Neck:     Comments: No meningismus, passive range of motion of the neck without pain Cardiovascular:     Rate and Rhythm: Normal rate and regular rhythm.     Heart sounds: No murmur heard. Pulmonary:     Effort: Pulmonary effort is normal. No respiratory distress.     Breath sounds: Normal breath sounds.  Abdominal:     Palpations: Abdomen is soft.     Tenderness: There is no abdominal tenderness. There is right CVA tenderness and left CVA tenderness.  Musculoskeletal:        General: No swelling.     Cervical back: Full passive range of motion without pain and neck supple.     Comments: No  midline tenderness to palpation of the cervical, thoracic or lumbar spine, negative straight leg raise test bilaterally  Skin:    General: Skin is warm and dry.     Capillary Refill: Capillary refill takes less than 2 seconds.  Neurological:     General: No focal deficit present.     Mental Status: She is alert.     GCS: GCS eye subscore is 4. GCS verbal subscore is 5. GCS motor subscore is 6.     Cranial Nerves: Cranial nerves 2-12 are intact.     Comments: 5 out of 5 strength in the bilateral upper and lower extremities with intact sensation to light touch  Psychiatric:        Mood and Affect: Mood normal.     ED Results / Procedures / Treatments   Labs (  all labs ordered are listed, but only abnormal results are displayed) Labs Reviewed  COMPREHENSIVE METABOLIC PANEL WITH GFR - Abnormal; Notable for the following components:      Result Value   Glucose, Bld 116 (*)    Total Protein 5.6 (*)    Albumin 3.2 (*)    All other components within normal limits  CBC WITH DIFFERENTIAL/PLATELET - Abnormal; Notable for the following components:   WBC 11.7 (*)    Neutro Abs 8.6 (*)    All other components within normal limits  URINALYSIS, ROUTINE W REFLEX MICROSCOPIC - Abnormal; Notable for the following components:   Color, Urine STRAW (*)    All other components within normal limits  CBG MONITORING, ED - Abnormal; Notable for the following components:   Glucose-Capillary 112 (*)    All other components within normal limits  RESP PANEL BY RT-PCR (RSV, FLU A&B, COVID)  RVPGX2  LIPASE, BLOOD  D-DIMER, QUANTITATIVE  TSH  T4, FREE  MAGNESIUM  TROPONIN I (HIGH SENSITIVITY)  TROPONIN I (HIGH SENSITIVITY)    EKG EKG Interpretation Date/Time:  Wednesday Sep 30 2023 08:22:10 EDT Ventricular Rate:  70 PR Interval:  130 QRS Duration:  98 QT Interval:  408 QTC Calculation: 441 R Axis:   42  Text Interpretation: Sinus rhythm Low voltage, precordial leads Confirmed by Rosealee Concha  ([id number]) on 09/30/2023 8:42:58 AM  Radiology DG Chest 1 View Result Date: 09/30/2023 CLINICAL DATA:  Altered mental status.  Chest pain and back pain. EXAM: CHEST  1 VIEW COMPARISON:  02/02/2023 FINDINGS: Apical lordotic projection. Thoracic spondylosis. Heart size within normal limits for projection. The lungs appear clear. No blunting of the costophrenic angles. IMPRESSION: 1. No acute findings. 2. Thoracic spondylosis. Electronically Signed   By: Freida Jes M.D.   On: 09/30/2023 08:41    Procedures Procedures    Medications Ordered in ED Medications  acetaminophen  (TYLENOL ) tablet 1,000 mg (1,000 mg Oral Given 09/30/23 0808)  ketorolac  (TORADOL ) 15 MG/ML injection 15 mg (15 mg Intravenous Given 09/30/23 1051)    ED Course/ Medical Decision Making/ A&P                                 Medical Decision Making Amount and/or Complexity of Data Reviewed Labs: ordered. Radiology: ordered.  Risk OTC drugs. Prescription drug management.   63 year old female with medical history significant for HLD, asthma, DMT2, GERD, HSV 1 and 2, headaches, palpitations, HTN presenting to the emergency department with multiple complaints.  The patient states that she was recently diagnosed with an odontogenic infection with associated dental pain and bilaterally in the upper and lower molars on the left for which she was prescribed amoxicillin.  She denies any allergy to amoxicillin and has been taking the medication without difficulty.  She states that she continues to endorse pain in her jaw and her teeth, no swelling in her mouth, no swelling in her face or neck, no tongue elevation.  She has not taken anything for the pain.  She does additionally endorses a mild headache located diffusely across her head, not sudden onset or maximal in onset, no neck stiffness or rigidity, no fevers or chills.  She states that she has had pain in her flanks bilaterally and has been having some urinary frequency and is  wondering whether she also is experiencing a urinary tract infection.  She denies any abdominal pain, nausea, vomiting, diarrhea.  She denies  any recent falls or trauma.  She denies any midline back pain.  She denies any saddle anesthesia, focal numbness or weakness in any extremities.  No facial droop, difficulty swallowing, no dizziness or blurred vision.    Additionally, overnight, the patient woke up in the middle of the night with a sensation of heart palpitations with associated chest discomfort, no cough.  No ripping or tearing chest pain, no radiation to the back, no chest pressure or heaviness.  No new lower extremity swelling, no shortness of breath.  Symptoms have completely resolved although concerned the patient enough to present to the emergency department for further evaluation.  Patient does state that she is scheduled for cardiac catheterization later this month.  On arrival, the patient was afebrile, not tachycardic or tachypneic, BP 125/50, saturating 96% on room air.  Physical exam generally unremarkable, overall well-appearing female, poor dentition was was present, patient without meningismus, has a reassuring neurologic exam.  She has bilateral CVA tenderness and/or paraspinal muscle tenderness.  No midline tenderness of the spine, no abdominal tenderness, lungs CTAB.  Negative straight leg raise test bilaterally.  Very low concern for acute intrathoracic or intra-abdominal abnormality as the cause of the patient's presentation.  Low concern for epidural abscess, cauda equina, fracture, other acute abnormality, low concern for aortic dissection, PE, intra-abdominal infection or other abnormality.  EKG was performed and revealed revealed sinus rhythm, ventricular rate 7 0, no acute ischemic changes.  A chest x-ray revealed no acute findings, sporadic spondylosis present.  Laboratory evaluation revealed CBG 112, urinalysis without hematuria or evidence of UTI, cardiac troponins x 2  normal, TSH and free T4 negative, D-dimer negative, lipase normal, magnesium normal, CMP without AKI, normal electrolytes, normal LFTs, CBC with a nonspecific leukocytosis to 11.7 which can be explained by the patient's odontogenic infection.  Patient is currently chest pain-free, normal delta troponin, negative D-dimer, low concern for acute intrathoracic abnormality requiring further diagnostic testing at this time.  Patient's headache resolved with 1 g of Tylenol .  Favored to likely tension type headache.  Patient's muscle aches and back discomfort improved with IV Toradol .  Low concern for infectious or other acute abnormality requiring further diagnostic testing in the spine.  Patient overall asymptomatic on repeat assessment, advised outpatient PCP follow-up advised continued oral amoxicillin as prescribed by her dentist to complete the full course and outpatient dental follow-up due to her poor dentition, no evidence of odontogenic abscess at this time.  Patient provided with return precautions in the event of any severe worsening chest discomfort or any red flag symptoms of spinal cord compression, all questions answered at time of discharge, patient stable for discharge and outpatient follow-up at this time.   Final Clinical Impression(s) / ED Diagnoses Final diagnoses:  Acute bilateral low back pain with sciatica, sciatica laterality unspecified  Pain, dental  Palpitations    Rx / DC Orders ED Discharge Orders     None         Rosealee Concha, MD 09/30/23 1144

## 2023-09-30 NOTE — ED Notes (Signed)
 Patient ambulated to bathroom without RN assistance

## 2023-09-30 NOTE — Discharge Instructions (Addendum)
 Your workup today was reassuring with a reassuring EKG, chest x-ray, cardiac enzymes in addition to screening labs.  Your headache improved with Tylenol  and your back pain improved with NSAIDs IV.  Recommend you continue taking oral NSAIDs for muscle aches and pains.  Return to the emergency department if you develop back pain with associated fever, weakness in any of your extremities, numbness in your groin and an area where you would sit on a saddle, urinary or fecal incontinence.  Return for any worsening chest pain or pressure.  Continue to take your prescribed amoxicillin as prescribed by your dentist.  Return as well if you develop fever with associated chest pain in the setting of tooth infection.

## 2023-10-08 ENCOUNTER — Emergency Department (HOSPITAL_BASED_OUTPATIENT_CLINIC_OR_DEPARTMENT_OTHER)
Admission: EM | Admit: 2023-10-08 | Discharge: 2023-10-08 | Disposition: A | Discharge status: Home / Self Care | Attending: Emergency Medicine | Admitting: Emergency Medicine

## 2023-10-08 ENCOUNTER — Emergency Department (HOSPITAL_BASED_OUTPATIENT_CLINIC_OR_DEPARTMENT_OTHER)

## 2023-10-08 ENCOUNTER — Other Ambulatory Visit: Payer: Self-pay

## 2023-10-08 ENCOUNTER — Encounter (HOSPITAL_BASED_OUTPATIENT_CLINIC_OR_DEPARTMENT_OTHER): Payer: Self-pay

## 2023-10-08 DIAGNOSIS — I2510 Atherosclerotic heart disease of native coronary artery without angina pectoris: Secondary | ICD-10-CM | POA: Diagnosis not present

## 2023-10-08 DIAGNOSIS — X500XXA Overexertion from strenuous movement or load, initial encounter: Secondary | ICD-10-CM | POA: Insufficient documentation

## 2023-10-08 DIAGNOSIS — I509 Heart failure, unspecified: Secondary | ICD-10-CM | POA: Insufficient documentation

## 2023-10-08 DIAGNOSIS — E119 Type 2 diabetes mellitus without complications: Secondary | ICD-10-CM | POA: Diagnosis not present

## 2023-10-08 DIAGNOSIS — S39012A Strain of muscle, fascia and tendon of lower back, initial encounter: Secondary | ICD-10-CM | POA: Insufficient documentation

## 2023-10-08 DIAGNOSIS — Z794 Long term (current) use of insulin: Secondary | ICD-10-CM | POA: Insufficient documentation

## 2023-10-08 DIAGNOSIS — Z7982 Long term (current) use of aspirin: Secondary | ICD-10-CM | POA: Insufficient documentation

## 2023-10-08 DIAGNOSIS — S3992XA Unspecified injury of lower back, initial encounter: Secondary | ICD-10-CM | POA: Diagnosis present

## 2023-10-08 MED ORDER — KETOROLAC TROMETHAMINE 15 MG/ML IJ SOLN
15.0000 mg | Freq: Once | INTRAMUSCULAR | Status: AC
  Administered 2023-10-08: 15 mg via INTRAMUSCULAR
  Filled 2023-10-08: qty 1

## 2023-10-08 MED ORDER — CYCLOBENZAPRINE HCL 10 MG PO TABS: 10.0000 mg | ORAL_TABLET | Freq: Two times a day (BID) | ORAL | 0 refills | Status: AC | PRN

## 2023-10-08 MED ORDER — LIDOCAINE 5 % EX PTCH: 1.0000 | MEDICATED_PATCH | CUTANEOUS | 0 refills | Status: DC

## 2023-10-08 MED ORDER — LIDOCAINE 5 % EX PTCH
1.0000 | MEDICATED_PATCH | CUTANEOUS | Status: DC
  Administered 2023-10-08: 1 via TRANSDERMAL
  Filled 2023-10-08: qty 1

## 2023-10-08 NOTE — ED Notes (Signed)
 ED Provider at bedside.

## 2023-10-08 NOTE — ED Notes (Signed)
 Patient transported to X-ray

## 2023-10-08 NOTE — ED Triage Notes (Signed)
 Pt states that she started having lower back pain that radiates to the front of her stomach.

## 2023-10-08 NOTE — Discharge Instructions (Addendum)
We saw you in the ER for back pain. Fortunately, our evaluation is not concerning for emergent pathology such as spinal cord compression or infection.   Often the pain is self limiting, you just need time and supportive medications such as ibuprofen/tylenol every 6-8 hours for the next few days. Take the muscle relaxant as needed (however do not operate machinery or drive after using this medication due to its sedating side effects), use the lidocaine patches, and see your primary care doctor for further management if the symptoms continue to linger.   Please use the back exercises to strengthen the back muscles and be very careful with activities in future to prevent similar painful events.  Please return to the ER if your pain becomes excruciating or you start developing new numbness, weakness, urinary incontinence (peeing on self without warning), urinary retention (not able to pee despite bladder feeling full), inability to defecate, pins and needles sensation by your ano-genital area.

## 2023-10-08 NOTE — ED Provider Notes (Signed)
 [place] EMERGENCY DEPARTMENT AT [facility] Provider Note   CSN: [id number] Arrival date & time: 10/08/23  2130     History  Chief Complaint  Patient presents with   Back Pain    Elizabeth Huber is a 63 y.o. female history of diabetes, CAD, CHF presented for back pain.  Patient states that she has had this pain for a while however last night became exacerbated.  Patient denies any heavy lifting or movement that may have caused this pain.  Patient denies any chest pain shortness of breath with me.  Patient denies any abdominal pain with me.  Patient denies nausea vomiting diarrhea, saddle esthesia, paresthesias or new onset weakness, urinary or bowel incontinence.  Patient took 800 mg of ibuprofen  to no relief.  Patient states she still will walk but this exacerbates the pain.  Home Medications Prior to Admission medications   Medication Sig Start Date End Date Taking? Authorizing Provider  cyclobenzaprine (FLEXERIL) 10 MG tablet Take 1 tablet (10 mg total) by mouth 2 (two) times daily as needed for muscle spasms. 10/08/23  Yes Tameron Lama, Arlin Benes, PA-C  lidocaine  (LIDODERM ) 5 % Place 1 patch onto the skin daily. Remove & Discard patch within 12 hours or as directed by MD 10/08/23  Yes Kiaja Shorty, Arlin Benes, PA-C  acetaminophen  (TYLENOL ) 500 MG tablet Take 500 mg by mouth every 6 (six) hours as needed for moderate pain or headache.     [provider]  aspirin  325 MG tablet Take 325 mg by mouth daily.    [provider]  atenolol  (TENORMIN ) 50 MG tablet Take 50 mg by mouth daily.    [provider]  atorvastatin  (LIPITOR) 40 MG tablet Take 40 mg by mouth daily. 06/09/23   [provider]  furosemide  (LASIX ) 40 MG tablet Take 1 tablet (40 mg total) by mouth daily. Patient taking differently: Take 40 mg by mouth 2 (two) times daily. 08/22/16   Justina Oman, MD  ibuprofen  (ADVIL ) 800 MG tablet Take 800 mg by mouth every 8 (eight) hours as needed. 10/26/20    [provider]  insulin  NPH-regular (NOVOLIN 70/30) (70-30) 100 UNIT/ML injection Inject 60 Units into the skin 2 (two) times daily with a meal.    [provider]  loratadine  (CLARITIN ) 10 MG tablet Take 1 tablet (10 mg total) by mouth daily. 08/22/16   Justina Oman, MD  losartan  (COZAAR ) 25 MG tablet Take 25 mg by mouth daily.    [provider]  Multiple Vitamins-Minerals (CENTRUM PO) Take 1 tablet by mouth daily.     [provider]  omeprazole  (PRILOSEC) 20 MG capsule Take 20 mg by mouth daily. Patient not taking: Reported on 08/10/2023    [provider]      Allergies    Hydrocodone, Oxycodone , Amoxicillin-pot clavulanate, Exenatide, Metformin hcl, Morphine  and codeine, Tradjenta [linagliptin], and Vicodin [hydrocodone-acetaminophen ]    Review of Systems   Review of Systems  Musculoskeletal:  Positive for back pain.    Physical Exam Updated Vital Signs BP 132/61 (BP Location: Right Arm)   Pulse 79   Temp 97.7 F (36.5 C) (Oral)   Resp 18   Ht 5\' 7"  (1.702 m)   Wt (!) 154.2 kg   LMP 04/22/2011   SpO2 99%   BMI 53.24 kg/m  Physical Exam Constitutional:      General: She is not in acute distress. Cardiovascular:     Rate and Rhythm: Normal rate.  Pulses: Normal pulses.  Musculoskeletal:     Comments: When asked where the pains that patient takes my hand and places it on the right paralumbar musculature; I do not feel any muscular abnormalities nor any bony tenderness in the midline nor pelvis or SI joint 5 out of 5 bilateral hip flexion No midline tenderness or abnormalities palpated  Skin:    General: Skin is warm and dry.     Capillary Refill: Capillary refill takes less than 2 seconds.     Findings: No rash.  Neurological:     Mental Status: She is alert.     Comments: Sensation intact distally Able to ambulate without difficulty does endorse pain when ambulating  Psychiatric:        Mood and Affect: Mood normal.      ED Results / Procedures / Treatments   Labs (all labs ordered are listed, but only abnormal results are displayed) Labs Reviewed - No data to display  EKG None  Radiology DG Lumbar Spine Complete Result Date: 10/08/2023 CLINICAL DATA:  Acute lower back pain. EXAM: LUMBAR SPINE - COMPLETE 4+ VIEW COMPARISON:  November 13, 2011.  August 10, 2023. FINDINGS: Minimal grade 1 anterolisthesis of L4-5 is noted secondary to posterior facet joint hypertrophy. Mild degenerative disc disease is noted at L1-2 and L4-5. No fracture is noted. IMPRESSION: Multilevel degenerative changes.  No acute abnormality seen. Electronically Signed   By: Rosalene Colon M.D.   On: 10/08/2023 10:10    Procedures Procedures    Medications Ordered in ED Medications  lidocaine  (LIDODERM ) 5 % 1 patch (1 patch Transdermal Patch Applied 10/08/23 0919)  ketorolac  (TORADOL ) 15 MG/ML injection 15 mg (15 mg Intramuscular Given 10/08/23 0919)    ED Course/ Medical Decision Making/ A&P                                 Medical Decision Making Amount and/or Complexity of Data Reviewed Radiology: ordered.  Risk Prescription drug management.   Elizabeth Huber 63 y.o. presented today for back pain. Working DDx that I considered at this time includes, but not limited to, MSK, underlying fracture, epidural hematoma/abscess, cauda equina syndrome, spinal stenosis, spinal malignancy, discitis, spinal infection, spondylitises/ spondylosis, conus medullaris, DDD of the back.  R/o DDx: underlying fracture, epidural hematoma/abscess, cauda equina syndrome, spinal stenosis, spinal malignancy, discitis, spinal infection, spondylitises/ spondylosis, conus medullaris, DDD of the back : less likely due to history of present illness, physical exam, labs/imaging findings.  Review of prior external notes: 09/08/2023 office visit  Unique Tests and My Interpretation:  Lumbar x-ray: Mild degenerative changes  Social Determinants of  Health: none  Discussion with Independent Historian: None  Discussion of Management of Tests: None  Risk: Medium: prescription drug management  Risk Stratification Score: None  Plan: On exam patient was no acute distress with stable vitals. No neurological deficits and normal neuro exam.  Patient can walk but states is painful.  No loss of bowel or bladder control.  No concern for cauda equina.  No fever, night sweats, weight loss, h/o cancer, IVDU.  RICE protocol and pain medicine indicated and discussed with patient.  Patient given Lidoderm  patch here and will be given prescription.   Triage note states that patient is having abdominal pain with this however when I speak to the patient she is adamant that it is her low back and when asked where the pain is as  she takes my hand and places it in her right paralumbar musculature away from her abdomen.  Patient has soft nontender abdomen and has been worked up recently for similar concern on 09/30/2023 and after shared decision making does not want to be evaluated for abdominal pain and just wants to be evaluated for her low back pain.  Will give Toradol  here as she has had this recently and this seemed to help along with a lidocaine  patch and get an x-ray to rule out any bony abnormalities however with patient placing my hand on her right paralumbar musculature and having no midline tenderness and reassuring exam do feel this is MSK in nature.  On reevaluation patient is back pain-free and her pain is 0 out of 10.  Patient states that she feels much better after pain meds and would like to go home.  Patient is walking without difficulty here and so we will get x-ray and if negative will discharge with outpatient follow-up.  X-ray negative for any acute pathology.  Patient given Flexeril as well.  I spoke to the patient how this medication will make them drowsy and do not operate machinery or drive afterwards to which the patient verbalized  understanding acceptance of.  Patient was given return precautions. Patient stable for discharge at this time.  Patient verbalized understanding of plan.  This chart was dictated using voice recognition software.  Despite best efforts to proofread,  errors can occur which can change the documentation meaning.        Final Clinical Impression(s) / ED Diagnoses Final diagnoses:  Strain of lumbar region, initial encounter    Rx / DC Orders ED Discharge Orders          Ordered    cyclobenzaprine (FLEXERIL) 10 MG tablet  2 times daily PRN        10/08/23 1012    lidocaine  (LIDODERM ) 5 %  Every 24 hours        10/08/23 1012              Elex Grimmer 10/08/23 1013    Jerilynn Montenegro, MD 10/08/23 1044

## 2023-11-06 ENCOUNTER — Emergency Department (HOSPITAL_BASED_OUTPATIENT_CLINIC_OR_DEPARTMENT_OTHER)
Admission: EM | Admit: 2023-11-06 | Discharge: 2023-11-06 | Disposition: A | Discharge status: Home / Self Care | Attending: Emergency Medicine | Admitting: Emergency Medicine

## 2023-11-06 ENCOUNTER — Emergency Department (HOSPITAL_BASED_OUTPATIENT_CLINIC_OR_DEPARTMENT_OTHER)

## 2023-11-06 ENCOUNTER — Other Ambulatory Visit: Payer: Self-pay

## 2023-11-06 ENCOUNTER — Encounter (HOSPITAL_BASED_OUTPATIENT_CLINIC_OR_DEPARTMENT_OTHER): Payer: Self-pay | Admitting: Emergency Medicine

## 2023-11-06 DIAGNOSIS — R748 Abnormal levels of other serum enzymes: Secondary | ICD-10-CM | POA: Diagnosis not present

## 2023-11-06 DIAGNOSIS — N3000 Acute cystitis without hematuria: Secondary | ICD-10-CM | POA: Diagnosis not present

## 2023-11-06 DIAGNOSIS — Z7982 Long term (current) use of aspirin: Secondary | ICD-10-CM | POA: Diagnosis not present

## 2023-11-06 DIAGNOSIS — R1032 Left lower quadrant pain: Secondary | ICD-10-CM | POA: Diagnosis present

## 2023-11-06 DIAGNOSIS — I2510 Atherosclerotic heart disease of native coronary artery without angina pectoris: Secondary | ICD-10-CM | POA: Insufficient documentation

## 2023-11-06 DIAGNOSIS — I10 Essential (primary) hypertension: Secondary | ICD-10-CM | POA: Insufficient documentation

## 2023-11-06 DIAGNOSIS — D72829 Elevated white blood cell count, unspecified: Secondary | ICD-10-CM | POA: Insufficient documentation

## 2023-11-06 DIAGNOSIS — Z79899 Other long term (current) drug therapy: Secondary | ICD-10-CM | POA: Insufficient documentation

## 2023-11-06 DIAGNOSIS — Z794 Long term (current) use of insulin: Secondary | ICD-10-CM | POA: Insufficient documentation

## 2023-11-06 DIAGNOSIS — E119 Type 2 diabetes mellitus without complications: Secondary | ICD-10-CM | POA: Diagnosis not present

## 2023-11-06 DIAGNOSIS — R198 Other specified symptoms and signs involving the digestive system and abdomen: Secondary | ICD-10-CM

## 2023-11-06 LAB — URINALYSIS, W/ REFLEX TO CULTURE (INFECTION SUSPECTED)
Bilirubin Urine: NEGATIVE
Glucose, UA: NEGATIVE mg/dL
Hgb urine dipstick: NEGATIVE
Ketones, ur: NEGATIVE mg/dL
Leukocytes,Ua: NEGATIVE
Nitrite: NEGATIVE
Protein, ur: NEGATIVE mg/dL
RBC / HPF: NONE SEEN RBC/hpf (ref 0–5)
Specific Gravity, Urine: <=1.005 (ref 1.005–1.030)
pH: 5.5 (ref 5.0–8.0)

## 2023-11-06 LAB — COMPREHENSIVE METABOLIC PANEL WITH GFR
ALT: 17 U/L (ref 0–44)
AST: 20 U/L (ref 15–41)
Albumin: 4.1 g/dL (ref 3.5–5.0)
Alkaline Phosphatase: 149 U/L — ABNORMAL HIGH (ref 38–126)
Anion gap: 13 (ref 5–15)
BUN: 21 mg/dL (ref 8–23)
CO2: 27 mmol/L (ref 22–32)
Calcium: 9.7 mg/dL (ref 8.9–10.3)
Chloride: 98 mmol/L (ref 98–111)
Creatinine, Ser: 0.86 mg/dL (ref 0.44–1.00)
GFR, Estimated: >60 mL/min (ref >60)
Glucose, Bld: 88 mg/dL (ref 70–99)
Potassium: 3.9 mmol/L (ref 3.5–5.1)
Sodium: 137 mmol/L (ref 135–145)
Total Bilirubin: <0.2 mg/dL (ref 0.0–1.2)
Total Protein: 7.5 g/dL (ref 6.5–8.1)

## 2023-11-06 LAB — CBC WITH DIFFERENTIAL/PLATELET
Abs Immature Granulocytes: 0.04 10*3/uL (ref 0.00–0.07)
Basophils Absolute: 0 10*3/uL (ref 0.0–0.1)
Basophils Relative: 0 %
Eosinophils Absolute: 0.2 10*3/uL (ref 0.0–0.5)
Eosinophils Relative: 1 %
HCT: 38 % (ref 36.0–46.0)
Hemoglobin: 13.2 g/dL (ref 12.0–15.0)
Immature Granulocytes: 0 %
Lymphocytes Relative: 25 %
Lymphs Abs: 3.3 10*3/uL (ref 0.7–4.0)
MCH: 28.8 pg (ref 26.0–34.0)
MCHC: 34.7 g/dL (ref 30.0–36.0)
MCV: 83 fL (ref 80.0–100.0)
Monocytes Absolute: 0.9 10*3/uL (ref 0.1–1.0)
Monocytes Relative: 6 %
Neutro Abs: 9.2 10*3/uL — ABNORMAL HIGH (ref 1.7–7.7)
Neutrophils Relative %: 68 %
Platelets: 373 10*3/uL (ref 150–400)
RBC: 4.58 MIL/uL (ref 3.87–5.11)
RDW: 12.1 % (ref 11.5–15.5)
WBC: 13.6 10*3/uL — ABNORMAL HIGH (ref 4.0–10.5)
nRBC: 0 % (ref 0.0–0.2)

## 2023-11-06 LAB — WET PREP, GENITAL
Clue Cells Wet Prep HPF POC: NONE SEEN
Sperm: NONE SEEN
Trich, Wet Prep: NONE SEEN
WBC, Wet Prep HPF POC: <10 (ref <10)
Yeast Wet Prep HPF POC: NONE SEEN

## 2023-11-06 LAB — CBG MONITORING, ED
Glucose-Capillary: 56 mg/dL — ABNORMAL LOW (ref 70–99)
Glucose-Capillary: 79 mg/dL (ref 70–99)

## 2023-11-06 LAB — LIPASE, BLOOD: Lipase: 20 U/L (ref 11–51)

## 2023-11-06 MED ORDER — IOHEXOL 300 MG/ML  SOLN
125.0000 mL | Freq: Once | INTRAMUSCULAR | Status: AC | PRN
  Administered 2023-11-06: 125 mL via INTRAVENOUS

## 2023-11-06 MED ORDER — KETOROLAC TROMETHAMINE 15 MG/ML IJ SOLN
15.0000 mg | Freq: Once | INTRAMUSCULAR | Status: AC
  Administered 2023-11-06: 15 mg via INTRAVENOUS
  Filled 2023-11-06: qty 1

## 2023-11-06 MED ORDER — ACETAMINOPHEN 500 MG PO TABS
1000.0000 mg | ORAL_TABLET | Freq: Once | ORAL | Status: AC
Start: 2023-11-06 — End: 2023-11-06
  Administered 2023-11-06: 1000 mg via ORAL
  Filled 2023-11-06: qty 2

## 2023-11-06 MED ORDER — FLUCONAZOLE 150 MG PO TABS
150.0000 mg | ORAL_TABLET | Freq: Once | ORAL | Status: AC
  Administered 2023-11-06: 150 mg via ORAL
  Filled 2023-11-06: qty 1

## 2023-11-06 MED ORDER — FOSFOMYCIN TROMETHAMINE 3 G PO PACK
3.0000 g | PACK | Freq: Once | ORAL | Status: AC
  Administered 2023-11-06: 3 g via ORAL
  Filled 2023-11-06: qty 3

## 2023-11-06 NOTE — ED Notes (Addendum)
 Patient called out for assistance to the restroom. Patient noted to be diaphoretic and reports feeling like her heart is racing. CBG obtained and sugar noted to be 56. Patient reports taking home insulin  PTA. Orange juice and crackers given to patient. MD notified.

## 2023-11-06 NOTE — ED Provider Notes (Signed)
  EMERGENCY DEPARTMENT AT [facility] Provider Note   CSN: [id number] Arrival date & time: 11/06/23  1737     Patient presents with: Dysuria   Elizabeth Huber is a 63 y.o. female.  With past medical history of diabetes, hyperlipidemia, obesity, coronary artery disease presenting to emergency room with complaint of abdominal pain.  Patient reports that last night she started having suprapubic and left lower quadrant abdominal pain seem to be rating around her left flank.  She tried heating pad for this.  Went to her primary care today and was told to come here to rule out diverticulitis.  She does note that she has had foul smell to her urine, burning and itching.  She said that she had a UA today done at primary care that was inconclusive.  She has not had fever at home but has noted chills. Notes recent antibiotic use. No diarrhea or constipation.  Denies chest pain shortness of breath or cough.    Dysuria Associated symptoms: abdominal pain        Prior to Admission medications   Medication Sig Start Date End Date Taking? Authorizing Provider  acetaminophen  (TYLENOL ) 500 MG tablet Take 500 mg by mouth every 6 (six) hours as needed for moderate pain or headache.     [provider]  aspirin  325 MG tablet Take 325 mg by mouth daily.    [provider]  atenolol  (TENORMIN ) 50 MG tablet Take 50 mg by mouth daily.    [provider]  atorvastatin  (LIPITOR) 40 MG tablet Take 40 mg by mouth daily. 06/09/23   [provider]  cyclobenzaprine  (FLEXERIL ) 10 MG tablet Take 1 tablet (10 mg total) by mouth 2 (two) times daily as needed for muscle spasms. 10/08/23   Denese Finn, PA-C  furosemide  (LASIX ) 40 MG tablet Take 1 tablet (40 mg total) by mouth daily. Patient taking differently: Take 40 mg by mouth 2 (two) times daily. 08/22/16   Justina Oman, MD  ibuprofen  (ADVIL ) 800 MG tablet Take 800 mg by mouth every 8 (eight) hours as needed.  10/26/20   [provider]  insulin  NPH-regular (NOVOLIN 70/30) (70-30) 100 UNIT/ML injection Inject 60 Units into the skin 2 (two) times daily with a meal.    [provider]  lidocaine  (LIDODERM ) 5 % Place 1 patch onto the skin daily. Remove & Discard patch within 12 hours or as directed by MD 10/08/23   Denese Finn, PA-C  loratadine  (CLARITIN ) 10 MG tablet Take 1 tablet (10 mg total) by mouth daily. 08/22/16   Justina Oman, MD  losartan  (COZAAR ) 25 MG tablet Take 25 mg by mouth daily.    [provider]  Multiple Vitamins-Minerals (CENTRUM PO) Take 1 tablet by mouth daily.     [provider]  omeprazole  (PRILOSEC) 20 MG capsule Take 20 mg by mouth daily. Patient not taking: Reported on 08/10/2023    [provider]    Allergies: Hydrocodone, Oxycodone , Amoxicillin-pot clavulanate, Exenatide, Metformin hcl, Morphine  and codeine, Tradjenta [linagliptin], and Vicodin [hydrocodone-acetaminophen ]    Review of Systems  Gastrointestinal:  Positive for abdominal pain.  Genitourinary:  Positive for dysuria.    Updated Vital Signs BP (!) 141/46 (BP Location: Left Wrist)   Pulse 74   Temp 97.6 F (36.4 C) (Oral)   Resp 19   Ht 5' 7 (1.702 m)   Wt (!) 154.2 kg   LMP 04/22/2011   SpO2 98%   BMI 53.24  kg/m   Physical Exam Vitals and nursing note reviewed.  Constitutional:      General: She is not in acute distress.    Appearance: She is obese. She is not toxic-appearing.  HENT:     Head: Normocephalic and atraumatic.   Eyes:     General: No scleral icterus.    Conjunctiva/sclera: Conjunctivae normal.    Cardiovascular:     Rate and Rhythm: Normal rate and regular rhythm.     Pulses: Normal pulses.     Heart sounds: Normal heart sounds.  Pulmonary:     Effort: Pulmonary effort is normal. No respiratory distress.     Breath sounds: Normal breath sounds.  Abdominal:     General: Abdomen is flat. Bowel sounds are normal.      Palpations: Abdomen is soft.     Tenderness: There is abdominal tenderness. There is no right CVA tenderness or left CVA tenderness.     Comments: Patient has tenderness to palpation over left flank and left lower quadrant.   Musculoskeletal:     Right lower leg: No edema.     Left lower leg: No edema.   Skin:    General: Skin is warm and dry.     Findings: No lesion.   Neurological:     General: No focal deficit present.     Mental Status: She is alert and oriented to person, place, and time. Mental status is at baseline.     (all labs ordered are listed, but only abnormal results are displayed) Labs Reviewed  WET PREP, GENITAL  URINALYSIS, W/ REFLEX TO CULTURE (INFECTION SUSPECTED)  COMPREHENSIVE METABOLIC PANEL WITH GFR  LIPASE, BLOOD  CBC WITH DIFFERENTIAL/PLATELET    EKG: None  Radiology: CT ABDOMEN PELVIS W CONTRAST Result Date: 11/06/2023 CLINICAL DATA:  Left lower quadrant abdominal pain. Recurrent urinary tract infection. EXAM: CT ABDOMEN AND PELVIS WITH CONTRAST TECHNIQUE: Multidetector CT imaging of the abdomen and pelvis was performed using the standard protocol following bolus administration of intravenous contrast. RADIATION DOSE REDUCTION: This exam was performed according to the departmental dose-optimization program which includes automated exposure control, adjustment of the mA and/or kV according to patient size and/or use of iterative reconstruction technique. CONTRAST:  OMNIPAQUE  IOHEXOL  300 MG/ML  SOLN COMPARISON:  08/10/2023 CT scan FINDINGS: Body habitus reduces diagnostic sensitivity and specificity. Lower chest: Unremarkable Hepatobiliary: Mildly contracted gallbladder. No biliary dilatation. No focal parenchymal lesion is identified in the liver. Pancreas: Unremarkable Spleen: Unremarkable Adrenals/Urinary Tract: Unremarkable Stomach/Bowel: Normal appendix. Upper normal amount of stool in the colon. No dilated small bowel. Vascular/Lymphatic: Mild  aortoiliac atherosclerotic vascular calcification. Reproductive: IUD satisfactorily positioned. Mildly serpentine left adnexal fluid density for example on image 75 series 2 suspicious for hydrosalpinx. Consider pelvic sonographic characterization. Other: No supplemental non-categorized findings. Musculoskeletal: 3 mm of grade 1 degenerative anterolisthesis at L4-5 with suspected mild bilateral foraminal stenosis at the L4-5 level. IMPRESSION: 1. Mildly serpentine left adnexal fluid density suspicious for hydrosalpinx. Consider pelvic sonographic characterization. 2. 3 mm of grade 1 degenerative anterolisthesis at L4-5 with suspected mild bilateral foraminal stenosis at the L4-5 level. 3. Mild aortoiliac atherosclerotic vascular calcification. Aortic Atherosclerosis (ICD10-I70.0). Electronically Signed   By: Freida Jes M.D.   On: 11/06/2023 20:43     Procedures   Medications Ordered in the ED  ketorolac  (TORADOL ) 15 MG/ML injection 15 mg (has no administration in time range)  fosfomycin (MONUROL) packet 3 g (has no administration in time range)  acetaminophen  (TYLENOL ) tablet 1,000  mg (1,000 mg Oral Given 11/06/23 1930)  iohexol  (OMNIPAQUE ) 300 MG/ML solution 125 mL (125 mLs Intravenous Contrast Given 11/06/23 2020)  fluconazole (DIFLUCAN) tablet 150 mg (150 mg Oral Given 11/06/23 2056)                                    Medical Decision Making Amount and/or Complexity of Data Reviewed Labs: ordered. Radiology: ordered.  Risk OTC drugs. Prescription drug management.   This patient presents to the ED for concern of abdominal pain, this involves an extensive number of treatment options, and is a complaint that carries with it a high risk of complications and morbidity.  The differential diagnosis includes pyelonephritis, urinary tract infection, yeast infection, appendicitis, diverticulitis, atypical presentation of ACS   Co morbidities that complicate the patient  evaluation  Obesity, coronary artery disease, congestive heart failure, diabetes   Additional history obtained:  Additional history obtained from office visit today with primary care 11/06/2023 in which patient was seen for annual visit and dysuria.   Lab Tests:  I personally interpreted labs.  The pertinent results include:   CBC with mild leukocytosis.  CMP without electrolyte abnormality however alk phos is mildly elevated. Wet prep negative.  UA negative but rare bacteria.  Urine culture sent. G&C obtained at PCP today, thus not added here.  Lipase 20 CBG found to be 56.  Patient tolerating oral intake, will recheck prior to discharge.   Imaging Studies ordered:  I ordered imaging studies including CT abdomen and pelvis I independently visualized and interpreted imaging which showed left adnexal fluid recommending pelvic ultrasound.  Ultrasound is unavailable thus feel this needs to be done outpatient.  Mild aortoiliac arthrosclerosis and mild degenerative at L4-L5. I agree with the radiologist interpretation   Cardiac Monitoring: / EKG:  The patient was maintained on a cardiac monitor.     Problem List / ED Course / Critical interventions / Medication management  Patient presents emergency room with complaint of suprapubic pain, dysuria and back pain.  Back pain is chronic by patient's explanation.  She has no red flag symptoms associated with her back pain.  She is ambulatory with steady gait.  She does have some reproducible tenderness to left lower quadrant and suprapubic area.  She is overall hemodynamically stable and well-appearing.  She is tolerating oral intake.  Her UA is negative for nitrates and leukocytes but does show rare bacteria, will send for culture and empirically treat given symptoms.  She has elevation in alk phos.  Discussed all labs and imaging with patient.  She is ultimately feeling better.  Blood glucose has improved after eating here in ER.  Feel she  is appropriate for discharge with outpatient follow-up with primary care. I ordered medication including Tylenol  for pain Reevaluation of the patient after these medicines showed that the patient improved I have reviewed the patients home medicines and have made adjustments as needed   Plan F/u w/ PCP in 2-3d to ensure resolution of sx.  Patient was given return precautions. Patient stable for discharge at this time.  Patient educated on sx/dx and verbalized understanding of plan. Return to ER w/ new or worsening sx.       Final diagnoses:  Acute cystitis without hematuria  Left pelvic adnexal fluid collection    ED Discharge Orders     None          Meleny Tregoning, Kandace Organ,  PA-C 11/06/23 2230    Lowery Rue, DO 11/06/23 2303

## 2023-11-06 NOTE — ED Triage Notes (Signed)
 Dysuria since 1400 today, with pain to bladder area

## 2023-11-06 NOTE — Discharge Instructions (Addendum)
 Your urine has been sent for culture.  I have given you 1 dose of antibiotic here.  You will need to follow-up with your primary care doctor to have pelvic ultrasound.    CT scan also shows spinal stenosis as well as arthrosclerosis.  Return to ER with new or worsening symptoms.

## 2023-11-07 LAB — URINE CULTURE: Culture: NO GROWTH

## 2023-11-09 ENCOUNTER — Ambulatory Visit (INDEPENDENT_AMBULATORY_CARE_PROVIDER_SITE_OTHER): Admitting: Podiatry

## 2023-11-09 ENCOUNTER — Encounter: Payer: Self-pay | Admitting: Podiatry

## 2023-11-09 DIAGNOSIS — G5792 Unspecified mononeuropathy of left lower limb: Secondary | ICD-10-CM

## 2023-11-09 DIAGNOSIS — G5791 Unspecified mononeuropathy of right lower limb: Secondary | ICD-10-CM | POA: Diagnosis not present

## 2023-11-09 DIAGNOSIS — E1140 Type 2 diabetes mellitus with diabetic neuropathy, unspecified: Secondary | ICD-10-CM | POA: Diagnosis not present

## 2023-11-09 MED ORDER — GABAPENTIN 300 MG PO CAPS: 300.0000 mg | ORAL_CAPSULE | Freq: Three times a day (TID) | ORAL | 3 refills | Status: AC

## 2023-11-09 NOTE — Progress Notes (Signed)
 Patient is a type II diabetic who presents with complaint of burning in the feet bilaterally his feet feel somewhat tight sometimes.  Tingling and numbness.  Has been bothering him for several months now.  Has not noticed any skin changes no redness ecchymosis or increased swelling.   Physical exam:  General appearance: Pleasant, and in no acute distress. AOx3.  Vascular: Pedal pulses: DP 2/4 bilaterally, PT 0/4 bilaterally.  Severe edema lower legs bilaterally. Capillary fill time immediate.  Neurological: Light touch intact feet bilaterally.  Normal Achilles reflex bilaterally.  No clonus or spasticity noted.  Decreased vibratory sensation foot bilaterally.  Monofilament sensation intact negative Tinel's sign tarsal tunnel porta pedis and dorsal cutaneous nerves on foot and ankle  Dermatologic:   Skin normal temperature bilaterally.  Some thinning and atrophic changes of the skin.  Some areas of hyperpigmentation.  No hair growth lower extremity bilaterally.  Musculoskeletal: No pain with palpation of the feet.  Good range of motion ankle joint and subtalar joint and first MTP.  Normal muscle strength present    Diagnosis: 1.  Neuritis feet bilaterally. 2.  Type 2 diabetes neuropathy  Plan: -New patient office visit level 3 for evaluation and management. - Discussed diabetic neuropathy and etiology and treatment.  Discussed importance of keeping her blood sugar under control.  Recommended we start treatment with gabapentin 1 p.o. 3 times daily.  Discussed other medications we sometimes use for this condition.  Discussed precautions she needs to take the feeding as far as protection discussed proper shoes and socks -Rx gabapentin 300 mg, 1 p.o. 3 times daily x 3 refills  Return 3 months follow-up neuropathy

## 2023-12-08 ENCOUNTER — Ambulatory Visit: Admitting: Physical Medicine and Rehabilitation

## 2023-12-08 ENCOUNTER — Encounter: Payer: Self-pay | Admitting: Physical Medicine and Rehabilitation

## 2023-12-08 DIAGNOSIS — M5442 Lumbago with sciatica, left side: Secondary | ICD-10-CM

## 2023-12-08 DIAGNOSIS — G8929 Other chronic pain: Secondary | ICD-10-CM | POA: Diagnosis not present

## 2023-12-08 DIAGNOSIS — M5416 Radiculopathy, lumbar region: Secondary | ICD-10-CM

## 2023-12-08 DIAGNOSIS — M5441 Lumbago with sciatica, right side: Secondary | ICD-10-CM

## 2023-12-08 DIAGNOSIS — M47816 Spondylosis without myelopathy or radiculopathy, lumbar region: Secondary | ICD-10-CM

## 2023-12-08 NOTE — Progress Notes (Signed)
 [name] - 63 y.o. female MRN [id number]  Date of birth: 11-18-1960  Office Visit Note: Visit Date: 12/08/2023 PCP: Wonda Worth SQUIBB, PA Referred by: Cristopher Bottcher, NP  Subjective: Chief Complaint  Patient presents with   Lower Back - Pain   HPI: Elizabeth Huber is a 63 y.o. female who comes in today per the request of Bottcher Cristopher, NP for evaluation of chronic, worsening and severe bilateral lower back pain radiating to both legs. Pain ongoing for several years, worsens with movement and activity. Sitting seems to alleviate her pain. She describes pain as squeezing and tight sensation, currently rates as 8 out of 10. Some relief of pain with home exercise regimen, rest and use of medications. No history of formal physical therapy. Recent lumbar radiographs show minimal grade 1 anterolisthesis of L4-5 is noted secondary to posterior facet joint hypertrophy. Multi level degenerative changes. Recent CT of lumbar spine shows multi level degenerative changes and facet arthropathy. Prominent facet arthropathy and spurring at L4-L5. There is right foraminal disc herniation at L2-L3 causing impingement. She was treated in emergency department in June for UTI. She was also seen in emergency department for strain of lumbar spine in May. She voices issues with diffuse chronic pain today, also reports pain to neck, entire back, shoulders and arms. Lower back pain seems to be biggest pain generator for her today. Patient denies focal weakness, numbness and tingling. No recent trauma or falls.   Patients course is complicated by morbid obesity, CAD, hypertension, PSVT and diabetes mellitus.       Review of Systems  Musculoskeletal:  Positive for back pain, joint pain, myalgias and neck pain.  Neurological:  Negative for tingling, sensory change, focal weakness and weakness.  All other systems reviewed and are negative.  Otherwise per HPI.  Assessment & Plan: Visit Diagnoses:    ICD-10-CM   1.  Chronic bilateral low back pain with bilateral sciatica  M54.42 Ambulatory referral to Physical Medicine Rehab   M54.41    G89.29     2. Radiculopathy, lumbar region  M54.16 Ambulatory referral to Physical Medicine Rehab    3. Facet arthropathy, lumbar  M47.816 Ambulatory referral to Physical Medicine Rehab       Plan: Findings:  Chronic, worsening and severe bilateral lower back pain radiating to both legs. Chronic diffuse generalized pain over entire body. Lower back pain seems to be biggest issue at this time and is limiting her functional ability. She continues with home exercise regimen, rest and use of medications. She was prescribed Flexeril  and Gabapentin  several months ago, she plans on starting this medications soon. Patients clinical presentation and exam are complex given her chronic diffuse body pain. I reviewed CT of lumbar spine, at the level of L4-L5 there is facet arthropathy and central canal stenosis at this level. I do think this level could be a pain generator for her. We discussed treatment plan in detail today. Next step is to perform L4-L5 interlaminar epidural steroid injection under fluoroscopic guidance. She is not currently taking anticoagulant medications. I discussed injection procedure with her in detail today, she has no questions at this time. No red flag symptoms noted upon exam today.     Meds & Orders: No orders of the defined types were placed in this encounter.   Orders Placed This Encounter  Procedures   Ambulatory referral to Physical Medicine Rehab    Follow-up: Return for L4-L5 interlaminar epidural steroid injection.   Procedures: No procedures  performed      Clinical History: No specialty comments available.   She reports that she has never smoked. She has never used smokeless tobacco. No results for input(s): HGBA1C, LABURIC in the last 8760 hours.  Objective:  VS:  HT:    WT:   BMI:     BP:   HR: bpm  TEMP: ( )  RESP:  Physical  Exam Vitals and nursing note reviewed.  HENT:     Head: Normocephalic and atraumatic.     Right Ear: External ear normal.     Left Ear: External ear normal.     Nose: Nose normal.     Mouth/Throat:     Mouth: Mucous membranes are moist.  Eyes:     Extraocular Movements: Extraocular movements intact.  Cardiovascular:     Rate and Rhythm: Normal rate.     Pulses: Normal pulses.  Pulmonary:     Effort: Pulmonary effort is normal.  Abdominal:     General: Abdomen is flat. There is no distension.  Musculoskeletal:        General: Tenderness present.     Cervical back: Normal range of motion.     Comments: Patient is slow to rise from seated position to standing. Good lumbar range of motion. No pain noted with facet loading. 5/5 strength noted with bilateral hip flexion, knee flexion/extension, ankle dorsiflexion/plantarflexion and EHL. No clonus noted bilaterally. No pain upon palpation of greater trochanters. No pain with internal/external rotation of bilateral hips. Sensation intact bilaterally. Myofascial tenderness noted upon palpation of bilateral thoracic and lumbar paraspinal regions. Negative slump test bilaterally. Ambulates without aid, gait steady.     Skin:    General: Skin is warm and dry.     Capillary Refill: Capillary refill takes less than 2 seconds.  Neurological:     General: No focal deficit present.     Mental Status: She is alert and oriented to person, place, and time.  Psychiatric:        Mood and Affect: Mood normal.        Behavior: Behavior normal.     Ortho Exam  Imaging: No results found.  Past Medical/Family/Surgical/Social History: Medications & Allergies reviewed per EMR, new medications updated. Patient Active Problem List   Diagnosis Date Noted   Abdominal pain 08/10/2023   History of colonic polyps 06/25/2023   Impingement syndrome of right shoulder 10/19/2020   Chronic diastolic HF (heart failure) (HCC)    Chest pain 08/19/2016   SOB  (shortness of breath) 08/19/2016   Acute bronchitis 08/19/2016   GERD (gastroesophageal reflux disease) 08/19/2016   CAD (coronary artery disease) 40% LAD afte Dx1 08/05/11 08/06/2011   PSVT (paroxysmal supraventricular tachycardia) (HCC) 08/06/2011   Chest pain 08/04/2011   Diabetes mellitus (HCC) 08/04/2011   Hyperlipidemia,  08/04/2011   Tachycardia, sinus tach on admission 08/04/2011   Obesity 08/04/2011   HTN (hypertension) 08/04/2011   Past Medical History:  Diagnosis Date   Angina    Asthma    Chronic bronchitis    have it whenever I have a cold   Diabetes mellitus type 2 in obese    Dysrhythmia 08/03/11   palpitations   GERD (gastroesophageal reflux disease)    Headache(784.0)    Herpes simplex    type 1 & 2   High cholesterol    Hypertension    Phlebitis    Shortness of breath on exertion    Family History  Problem Relation Age of Onset  Diabetes Mother    Hypertension Mother    CAD Father    Diabetes Sister    Healthy Brother    Breast cancer Neg Hx    Past Surgical History:  Procedure Laterality Date   CORONARY ANGIOGRAM N/A 08/05/2011   Procedure: CORONARY ANGIOGRAM;  Surgeon: Jerel Balding, MD;  Location: [facility] CATH [facility];  Service: Cardiovascular;  Laterality: N/A;   SALPINGECTOMY     w/ovary removal; right   SALPINGOOPHORECTOMY     TOENAIL EXCISION     Social History   Occupational History   Not on file  Tobacco Use   Smoking status: Never   Smokeless tobacco: Never  Vaping Use   Vaping status: Never Used  Substance and Sexual Activity   Alcohol use: No   Drug use: No   Sexual activity: Not Currently

## 2023-12-08 NOTE — Progress Notes (Signed)
 Pain Scale   Average Pain 6 Patient advising she has lower back pain radiating bilaterally to both legs.Patient advising she is in constant pain.        +Driver, -BT, -Dye Allergies.

## 2023-12-29 ENCOUNTER — Inpatient Hospital Stay (HOSPITAL_BASED_OUTPATIENT_CLINIC_OR_DEPARTMENT_OTHER): Admitting: Medical Oncology

## 2023-12-29 ENCOUNTER — Inpatient Hospital Stay: Attending: Medical Oncology

## 2023-12-29 VITALS — BP 90/69 | HR 78 | Temp 98.1°F | Resp 18 | Ht 67.0 in | Wt 337.0 lb

## 2023-12-29 DIAGNOSIS — D72829 Elevated white blood cell count, unspecified: Secondary | ICD-10-CM | POA: Diagnosis not present

## 2023-12-29 LAB — CBC WITH DIFFERENTIAL (CANCER CENTER ONLY)
Abs Immature Granulocytes: 0.05 K/uL (ref 0.00–0.07)
Basophils Absolute: 0.1 K/uL (ref 0.0–0.1)
Basophils Relative: 1 %
Eosinophils Absolute: 0.1 K/uL (ref 0.0–0.5)
Eosinophils Relative: 1 %
HCT: 40.4 % (ref 36.0–46.0)
Hemoglobin: 14 g/dL (ref 12.0–15.0)
Immature Granulocytes: 1 %
Lymphocytes Relative: 23 %
Lymphs Abs: 2.5 K/uL (ref 0.7–4.0)
MCH: 29.4 pg (ref 26.0–34.0)
MCHC: 34.7 g/dL (ref 30.0–36.0)
MCV: 84.7 fL (ref 80.0–100.0)
Monocytes Absolute: 0.6 K/uL (ref 0.1–1.0)
Monocytes Relative: 5 %
Neutro Abs: 7.7 K/uL (ref 1.7–7.7)
Neutrophils Relative %: 69 %
Platelet Count: 340 K/uL (ref 150–400)
RBC: 4.77 MIL/uL (ref 3.87–5.11)
RDW: 12.1 % (ref 11.5–15.5)
WBC Count: 10.9 K/uL — ABNORMAL HIGH (ref 4.0–10.5)
nRBC: 0 % (ref 0.0–0.2)

## 2023-12-29 LAB — CMP (CANCER CENTER ONLY)
ALT: 10 U/L (ref 0–44)
AST: 16 U/L (ref 15–41)
Albumin: 4 g/dL (ref 3.5–5.0)
Alkaline Phosphatase: 146 U/L — ABNORMAL HIGH (ref 38–126)
Anion gap: 12 (ref 5–15)
BUN: 16 mg/dL (ref 8–23)
CO2: 27 mmol/L (ref 22–32)
Calcium: 9.2 mg/dL (ref 8.9–10.3)
Chloride: 98 mmol/L (ref 98–111)
Creatinine: 0.78 mg/dL (ref 0.44–1.00)
GFR, Estimated: >60 mL/min (ref >60)
Glucose, Bld: 173 mg/dL — ABNORMAL HIGH (ref 70–99)
Potassium: 3.8 mmol/L (ref 3.5–5.1)
Sodium: 137 mmol/L (ref 135–145)
Total Bilirubin: 0.5 mg/dL (ref 0.0–1.2)
Total Protein: 7.4 g/dL (ref 6.5–8.1)

## 2023-12-29 LAB — SAVE SMEAR(SSMR), FOR PROVIDER SLIDE REVIEW

## 2023-12-29 LAB — C-REACTIVE PROTEIN: CRP: 2.1 mg/dL — ABNORMAL HIGH (ref <1.0)

## 2023-12-29 LAB — SEDIMENTATION RATE: Sed Rate: 48 mm/h — ABNORMAL HIGH (ref 0–22)

## 2023-12-29 NOTE — Progress Notes (Signed)
 [facility] [phone]   [phone]  INITIAL CONSULT NOTE  Patient Care Team: Wonda Worth SQUIBB, PA as PCP - General (Physician Assistant)  CHIEF COMPLAINTS/PURPOSE OF CONSULTATION:  Leukocytosis   HISTORY OF PRESENTING ILLNESS:  Elizabeth Huber 63 y.o. female is referred to our office by Lyle Bertrand NP:   Her WBC changes appear to be acute on chronic with an elevated WBC dating back at least 2 years.   Signs of infection: Currently sick with mild viral illness which started this past Friday.  Recent illness: No Sick contacts: No Recent travel: No  Fever: No Night Sweats: No Weight loss: No- purposefully losing weight with lower calorie diet Obesity: Yes Bleeding/bruising: No Fatigue:No Inflammatory conditions: No history of RA, Kawasaki, Crohn's, Ulcerative colitis, chronic hepatitis, Sweet syndrome:  No Malignancies: No Sickle Cell Disease: No Smoking: No Vigorous exercise: No Asplenia or spleen injury: No Pregnancy: No Thyroid  disease: No Family History Leukocytosis: No Occupational hazards to benzene, pesticides, industrial chemicals: No History of chemotherapy/radiation: No Pruritus: No Cough: No Rash: No SOB/Chest pain/Dyspnea/Peripheral edema: (eosinophilic myocarditis):No Steroid Use/ Bone marrow stimulators/ Catecholamines, Lithium, Plerixafor: No Extreme Physical or emotional Stress: Yes No known history of anemia: No No lumps or bumps that she has noticed.    MEDICAL HISTORY:  Past Medical History:  Diagnosis Date   Angina    Asthma    Chronic bronchitis    have it whenever I have a cold   Diabetes mellitus type 2 in obese    Dysrhythmia 08/03/11   palpitations   GERD (gastroesophageal reflux disease)    Headache(784.0)    Herpes simplex    type 1 & 2   High cholesterol    Hypertension    Phlebitis    Shortness of breath on exertion     SURGICAL HISTORY: Past Surgical History:  Procedure  Laterality Date   CORONARY ANGIOGRAM N/A 08/05/2011   Procedure: CORONARY ANGIOGRAM;  Surgeon: Jerel Balding, MD;  Location: [facility] CATH LAB;  Service: Cardiovascular;  Laterality: N/A;   SALPINGECTOMY     w/ovary removal; right   SALPINGOOPHORECTOMY     TOENAIL EXCISION      SOCIAL HISTORY: Social History   Socioeconomic History   Marital status: Single    Spouse name: Not on file   Number of children: Not on file   Years of education: Not on file   Highest education level: Not on file  Occupational History   Not on file  Tobacco Use   Smoking status: Never   Smokeless tobacco: Never  Vaping Use   Vaping status: Never Used  Substance and Sexual Activity   Alcohol use: No   Drug use: No   Sexual activity: Not Currently  Other Topics Concern   Not on file  Social History Narrative   Not on file   Social Drivers of Health   Financial Resource Strain: Low Risk  (06/09/2023)   Received from [facility]   Overall Financial Resource Strain (CARDIA)    Difficulty of Paying Living Expenses: Not hard at all  Recent Concern: Financial Resource Strain - Medium Risk (04/08/2023)   Received from [facility]   Overall Financial Resource Strain (CARDIA)    Difficulty of Paying Living Expenses: Somewhat hard  Food Insecurity: No Food Insecurity (08/10/2023)   Hunger Vital Sign    Worried About Running Out of Food in the Last Year: Never true    Ran Out of Food in the Last Year:  Never true  Transportation Needs: No Transportation Needs (08/10/2023)   PRAPARE - Administrator, Civil Service (Medical): No    Lack of Transportation (Non-Medical): No  Physical Activity: Unknown (04/08/2023)   Received from [facility]   Exercise Vital Sign    On average, how many days per week do you engage in moderate to strenuous exercise (like a brisk walk)?: 0 days    Minutes of Exercise per Session: Not on file  Stress: No Stress Concern Present (10/13/2023)   Received from [facility] - Occupational Stress Questionnaire    Feeling of Stress : Only a little  Social Connections: Patient Declined (04/08/2023)   Received from [facility]   Social Network    How would you rate your social network (family, work, friends)?: Patient declined  Intimate Partner Violence: Not At Risk (10/13/2023)   Received from [facility]   HITS    Over the last 12 months how often did your partner physically hurt you?: Never    Over the last 12 months how often did your partner insult you or talk down to you?: Never    Over the last 12 months how often did your partner threaten you with physical harm?: Never    Over the last 12 months how often did your partner scream or curse at you?: Never    FAMILY HISTORY: Family History  Problem Relation Age of Onset   Diabetes Mother    Hypertension Mother    CAD Father    Diabetes Sister    Healthy Brother    Breast cancer Neg Hx     ALLERGIES:  is allergic to hydrocodone, oxycodone , amoxicillin-pot clavulanate, exenatide, metformin hcl, morphine  and codeine, tradjenta [linagliptin], and vicodin [hydrocodone-acetaminophen ].  MEDICATIONS:  Current Outpatient Medications  Medication Sig Dispense Refill   acetaminophen  (TYLENOL ) 500 MG tablet Take 500 mg by mouth every 6 (six) hours as needed for moderate pain or headache.      aspirin  325 MG tablet Take 325 mg by mouth daily.     atenolol  (TENORMIN ) 50 MG tablet Take 50 mg by mouth daily.     atorvastatin  (LIPITOR) 40 MG tablet Take 40 mg by mouth daily.     furosemide  (LASIX ) 40 MG tablet Take 1 tablet (40 mg total) by mouth daily. (Patient taking differently: Take 40 mg by mouth 2 (two) times daily.) 30 tablet 1   gabapentin  (NEURONTIN ) 300 MG capsule Take 1 capsule (300 mg total) by mouth 3 (three) times daily. 90 capsule 3   ibuprofen  (ADVIL ) 800 MG tablet Take 800 mg by mouth every 8 (eight) hours as needed.     insulin  NPH-regular  (NOVOLIN 70/30) (70-30) 100 UNIT/ML injection Inject 60 Units into the skin 2 (two) times daily with a meal.     lidocaine  (LIDODERM ) 5 % Place 1 patch onto the skin daily. Remove & Discard patch within 12 hours or as directed by MD 30 patch 0   loratadine  (CLARITIN ) 10 MG tablet Take 1 tablet (10 mg total) by mouth daily. 30 tablet 0   losartan  (COZAAR ) 25 MG tablet Take 25 mg by mouth daily.     omeprazole  (PRILOSEC) 20 MG capsule Take 20 mg by mouth daily.     cyclobenzaprine  (FLEXERIL ) 10 MG tablet Take 1 tablet (10 mg total) by mouth 2 (two) times daily as needed for muscle spasms. (Patient not taking: Reported on 11/09/2023) 20 tablet 0  No current facility-administered medications for this visit.    REVIEW OF SYSTEMS:   Constitutional: ( - ) fevers, ( - )  chills , ( - ) night sweats Eyes: ( - ) blurriness of vision, ( - ) double vision, ( - ) watery eyes Ears, nose, mouth, throat, and face: ( - ) mucositis, ( - ) sore throat Respiratory: ( - ) cough, ( - ) dyspnea, ( - ) wheezes Cardiovascular: ( - ) palpitation, ( - ) chest discomfort, ( - ) lower extremity swelling Gastrointestinal:  ( - ) nausea, ( - ) heartburn, ( - ) change in bowel habits Skin: ( - ) abnormal skin rashes Lymphatics: ( - ) new lymphadenopathy, ( - ) easy bruising Neurological: ( - ) numbness, ( - ) tingling, ( - ) new weaknesses Behavioral/Psych: ( - ) mood change, ( - ) new changes  All other systems were reviewed with the patient and are negative.  PHYSICAL EXAMINATION: ECOG PERFORMANCE STATUS: 0 - Asymptomatic  Vitals:   12/29/23 1305  BP: 90/69  Pulse: 78  Resp: 18  Temp: 98.1 F (36.7 C)  SpO2: 97%   Filed Weights   12/29/23 1305  Weight: (!) 337 lb (152.9 kg)    GENERAL: well appearing obese female in NAD  SKIN: skin color, texture, turgor are normal, no rashes or significant lesions EYES: conjunctiva are pink and non-injected, sclera clear OROPHARYNX: no exudate, no erythema; lips,  buccal mucosa, and tongue normal  NECK: supple, non-tender LYMPH:  no palpable lymphadenopathy in the cervical, axillary or supraclavicular lymph nodes.  LUNGS: clear to auscultation and percussion with normal breathing effort HEART: regular rate & rhythm and no murmurs and no lower extremity edema ABDOMEN: soft, non-tender, non-distended, normal bowel sounds Musculoskeletal: no cyanosis of digits and no clubbing  PSYCH: alert & oriented x 3, fluent speech NEURO: no focal motor/sensory deficits  LABORATORY DATA:  Pending   BLOOD FILM: Review of the peripheral blood smear showed normal appearing white cells with neutrophils that were appropriately lobated and granulated. There was no predominance of bi-lobed or hyper-segmented neutrophils appreciated. No Dohle bodies were noted. There was no left shifting, immature forms or blasts noted. Lymphocytes remain normal in size without any predominance of large granular lymphocytes. Red cells show no anisopoikilocytosis, macrocytes , microcytes or polychromasia. There were no schistocytes, target cells, echinocytes, acanthocytes, dacrocytes, or stomatocytes.There was no rouleaux formation, nucleated red cells, or intra-cellular inclusions noted. The platelets are normal in size, shape, and color without any clumping evident.  ASSESSMENT & PLAN Elizabeth Huber is a 63 y.o. caucasian female who was referred to us  for leukocytosis:  Her last set of labs from 11/06/2023 show a WBC of 13.6 and an ANC of 9.2. Normal Hgb and platelet count.   We discussed that leukocytosis is an elevation of the WBC of the body. This condition is fairly common and there are many various potential causes. Often times leukocytosis resolves on its own without any intervention.   There are different types of WBCs and elevations in each can help determine its potential cause. For example, eosinophilia can indicate an allergic response or response to parasitic infections/chronic  disease. Lymphocytosis can be from viral syndromes or autoimmune causes. Elevated neutrophil count is the most common form of leukocytosis. About 2.5% of the population naturally falls outside of the standard range. Significant elevations, 2 times above the standard deviation often indicate a bacterial infection. Acute and chronic inflammation due to conditions like  rheumatic diseases, Kawasaki disease, adult-onset Still disease, inflammatory bowel disease, and chronic hepatitis are common causes. Additionally, myeloproliferative neoplasms, asplenia, cigarette smoking, stress, pregnancy, obesity, thyroid  disorders, Down syndrome, nonhematologic malignancies, and medications like glucocorticoids, catecholamines, and lithium are potential causes.   A leukemoid reaction is a transient increase in WBC count marked by a neutrophil count of >50,000 cells/L without a myeloproliferative neoplasm. Medications, asplenia, nonhematologic malignancies, and infection with Costridioides difficile, tuberculosis, pertussis, and visceral larva migrans can cause a leukemoid reaction. This acute inflammatory reaction can be mistaken for leukemia, but careful history, physical examination, and further laboratory evaluation can confirm the diagnosis. Peripheral smears and radiological imaging may be necessary to identify the actual cause of these reactive laboratory findings.[3] This diagnosis must be differentiated from leukemia, defined as increases in blast cells, precursor cells to leukocytes, and immature WBCs rather than mature neutrophils seen in a leukemoid reaction. A leukemoid reaction improves after treating the underlying cause of the neutrophilia, whereas leukemia continues to demonstrate elevated WBCs until the completion of definitive treatment.   Hypersensitivity reactions, leukemia, stress, asplenia, thymoma, and lymphoma may cause lymphocytosis. Infectious causes of lymphocytosis are generally viral, like EBV,  cytomegalovirus, influenza, measles, mumps, rubella, adenovirus, and Coxsackie virus. Bacterial infections like pertussis and cat scratch disease cause lymphocytosis. Additional possible infectious causes are tuberculosis, brucellosis, babesiosis, and syphilis.   Eosinophils account for approximately 1% to 4% of a person's leukocytes, and an eosinophil count >500 cells/L defines eosinophilia. The list of potential underlying causes of eosinophilia is extensive. Allergic conditions like allergic rhinitis and atopic dermatitis are common causes and are generally associated with mild eosinophilia. Patients with severe eosinophilia, >=20,000 cells/L, are more likely to have a myeloid neoplasm. Besides these 2 extremes, the level of eosinophilia does not help distinguish the underlying cause.  Infectious causes of eosinophilia include helminths, fungi, protozoa, some bacteria, HIV, human T-cell lymphotropic virus type 1, and scabies.[6][7] Nearly any medication reaction can cause eosinophilia. However, nonsteroidal anti-inflammatory (NSAID) medications, allopurinol, phenytoin, penicillins, cephalosporins, and sulfasalazine are some of the more commonly known medications associated with specific syndromes involving eosinophilia. Additional causes of eosinophilia are rheumatologic diseases like eosinophilic granulomatosis with polyangiitis, adrenal insufficiency, and immunodeficiency syndromes.  Work up often includes labs to address potential causes and monitoring for abrupt significant changes to lab values. For persistent/significant leukocytosis, a bone marrow biopsy is typically recommended.   Leukoerythroblastosis typically suggests a myelophthisic process, where normal marrow space is infiltrated and replaced by nonhematopoietic or abnormal cells. The peripheral smear reveals leukocytosis with immature erythroid, myeloid, and blast cells in the peripheral blood. Other potential causes are cytokine release,  such as severe acute respiratory syndrome coronavirus 2 (SARS-CoV-2), direct myelotoxicity, and viral infection  Transient basophilia is a reactive response, especially to an acute viral illness. Persistent basophilia on serial CBCs for longer than 8 weeks suggests an underlying malignancy or myeloproliferative disease.   Plan:  Recent TSH was normal (1.605 on 09/30/2023) Lab work today- CBC w/, CMP, BCR-ABL FISH, ESR, CRP, smear   RTC 6 months APP, labs (CBC w/, CMP, LDH, smear) or sooner as needed  All questions were answered. The patient knows to call the clinic with any problems, questions or concerns.  I have spent a total of 55 minutes minutes of face-to-face and non-face-to-face time, preparing to see the patient, obtaining and/or reviewing separately obtained history, performing a medically appropriate examination, counseling and educating the patient, ordering medications/tests/procedures, referring and communicating with other health care professionals, documenting clinical information  in the electronic health record, independently interpreting results and communicating results to the patient, and care coordination.    Lauraine Dais PA-C Department of Hematology/Oncology [facility]

## 2023-12-31 ENCOUNTER — Ambulatory Visit: Payer: Self-pay | Admitting: Medical Oncology

## 2024-01-01 LAB — BCR-ABL1, CML/ALL, PCR, QUANT
E1A2 Transcript: <0.0032 %
Interpretation (BCRAL):: NEGATIVE
b2a2 transcript: <0.0032 %
b3a2 transcript: <0.0032 %

## 2024-01-05 ENCOUNTER — Encounter: Admitting: Physical Medicine and Rehabilitation

## 2024-01-14 ENCOUNTER — Telehealth: Payer: Self-pay | Admitting: Physical Medicine and Rehabilitation

## 2024-01-14 NOTE — Telephone Encounter (Signed)
 Patient called and said cancel her appointment and she didn't say if she wanted to reschedule or not. CB#[phone]

## 2024-01-15 ENCOUNTER — Encounter: Admitting: Physical Medicine and Rehabilitation

## 2024-01-25 ENCOUNTER — Other Ambulatory Visit: Payer: Self-pay

## 2024-01-25 ENCOUNTER — Ambulatory Visit (INDEPENDENT_AMBULATORY_CARE_PROVIDER_SITE_OTHER): Admitting: Physical Medicine and Rehabilitation

## 2024-01-25 VITALS — BP 154/82 | HR 72

## 2024-01-25 DIAGNOSIS — M5416 Radiculopathy, lumbar region: Secondary | ICD-10-CM | POA: Diagnosis not present

## 2024-01-25 MED ORDER — METHYLPREDNISOLONE ACETATE 40 MG/ML IJ SUSP
40.0000 mg | Freq: Once | INTRAMUSCULAR | Status: AC
  Administered 2024-01-25: 40 mg

## 2024-01-25 NOTE — Progress Notes (Signed)
 Pain Scale   Average Pain 10 Patient advising she has lower back pain radiating bilateral legs without relief        +Driver, -BT, -Dye Allergies.

## 2024-01-25 NOTE — Procedures (Signed)
 Lumbar Epidural Steroid Injection - Interlaminar Approach with Fluoroscopic Guidance  Patient: Elizabeth Huber      Date of Birth: Apr 27, 1961 MRN: [id number] PCP: Wonda Worth SQUIBB, PA      Visit Date: 01/25/2024   Universal Protocol:     Consent Given By: the patient  Position: PRONE  Additional Comments: Vital signs were monitored before and after the procedure. Patient was prepped and draped in the usual sterile fashion. The correct patient, procedure, and site was verified.   Injection Procedure Details:   Procedure diagnoses: Lumbar radiculopathy [M54.16]   Meds Administered:  Meds ordered this encounter  Medications   methylPREDNISolone  acetate (DEPO-MEDROL ) injection 40 mg     Laterality: Right  Location/Site:  L4-5  Needle: 4.5 in., 20 ga. Tuohy  Needle Placement: Paramedian epidural  Findings:   -Comments: Excellent flow of contrast into the epidural space.  Procedure Details: Using a paramedian approach from the side mentioned above, the region overlying the inferior lamina was localized under fluoroscopic visualization and the soft tissues overlying this structure were infiltrated with 4 ml. of 1% Lidocaine  without Epinephrine. The Tuohy needle was inserted into the epidural space using a paramedian approach.   The epidural space was localized using loss of resistance along with counter oblique bi-planar fluoroscopic views.  After negative aspirate for air, blood, and CSF, a 2 ml. volume of Isovue -250 was injected into the epidural space and the flow of contrast was observed. Radiographs were obtained for documentation purposes.    The injectate was administered into the level noted above.   Additional Comments:  The patient tolerated the procedure well Dressing: 2 x 2 sterile gauze and Band-Aid    Post-procedure details: Patient was observed during the procedure. Post-procedure instructions were reviewed.  Patient left the clinic in stable condition.

## 2024-01-25 NOTE — Progress Notes (Signed)
 Elizabeth Huber - 63 y.o. female MRN [id number]  Date of birth: 11-01-1960  Office Visit Note: Visit Date: 01/25/2024 PCP: Wonda Worth SQUIBB, PA Referred by: Turmel, Worth SQUIBB, PA  Subjective: Chief Complaint  Patient presents with   Lower Back - Pain   HPI:  Elizabeth Huber is a 63 y.o. female who comes in today at the request of Duwaine Pouch, FNP for planned Right L4-5 Lumbar Interlaminar epidural steroid injection with fluoroscopic guidance.  The patient has failed conservative care including home exercise, medications, time and activity modification.  This injection will be diagnostic and hopefully therapeutic.  Please see requesting physician notes for further details and justification.   ROS Otherwise per HPI.  Assessment & Plan: Visit Diagnoses:    ICD-10-CM   1. Lumbar radiculopathy  M54.16 XR C-ARM NO REPORT    Epidural Steroid injection    methylPREDNISolone  acetate (DEPO-MEDROL ) injection 40 mg      Plan: No additional findings.   Meds & Orders:  Meds ordered this encounter  Medications   methylPREDNISolone  acetate (DEPO-MEDROL ) injection 40 mg    Orders Placed This Encounter  Procedures   XR C-ARM NO REPORT   Epidural Steroid injection    Follow-up: Return for visit to requesting provider as needed.   Procedures: No procedures performed  Lumbar Epidural Steroid Injection - Interlaminar Approach with Fluoroscopic Guidance  Patient: Elizabeth Huber      Date of Birth: 12-Oct-1960 MRN: [id number] PCP: Wonda Worth SQUIBB, PA      Visit Date: 01/25/2024   Universal Protocol:     Consent Given By: the patient  Position: PRONE  Additional Comments: Vital signs were monitored before and after the procedure. Patient was prepped and draped in the usual sterile fashion. The correct patient, procedure, and site was verified.   Injection Procedure Details:   Procedure diagnoses: Lumbar radiculopathy [M54.16]   Meds Administered:  Meds ordered this encounter   Medications   methylPREDNISolone  acetate (DEPO-MEDROL ) injection 40 mg     Laterality: Right  Location/Site:  L4-5  Needle: 4.5 in., 20 ga. Tuohy  Needle Placement: Paramedian epidural  Findings:   -Comments: Excellent flow of contrast into the epidural space.  Procedure Details: Using a paramedian approach from the side mentioned above, the region overlying the inferior lamina was localized under fluoroscopic visualization and the soft tissues overlying this structure were infiltrated with 4 ml. of 1% Lidocaine  without Epinephrine. The Tuohy needle was inserted into the epidural space using a paramedian approach.   The epidural space was localized using loss of resistance along with counter oblique bi-planar fluoroscopic views.  After negative aspirate for air, blood, and CSF, a 2 ml. volume of Isovue -250 was injected into the epidural space and the flow of contrast was observed. Radiographs were obtained for documentation purposes.    The injectate was administered into the level noted above.   Additional Comments:  The patient tolerated the procedure well Dressing: 2 x 2 sterile gauze and Band-Aid    Post-procedure details: Patient was observed during the procedure. Post-procedure instructions were reviewed.  Patient left the clinic in stable condition.    Clinical History: No specialty comments available.     Objective:  VS:  HT:    WT:   BMI:     BP:(!) 154/82  HR:72bpm  TEMP: ( )  RESP:  Physical Exam Vitals and nursing note reviewed.  Constitutional:      General: She is not in acute distress.  Appearance: Normal appearance. She is obese. She is not ill-appearing.  HENT:     Head: Normocephalic and atraumatic.     Right Ear: External ear normal.     Left Ear: External ear normal.  Eyes:     Extraocular Movements: Extraocular movements intact.  Cardiovascular:     Rate and Rhythm: Normal rate.     Pulses: Normal pulses.  Pulmonary:     Effort:  Pulmonary effort is normal. No respiratory distress.  Abdominal:     General: There is no distension.     Palpations: Abdomen is soft.  Musculoskeletal:        General: Tenderness present.     Cervical back: Neck supple.     Right lower leg: No edema.     Left lower leg: No edema.     Comments: Patient has good distal strength with no pain over the greater trochanters.  No clonus or focal weakness.  Skin:    Findings: No erythema, lesion or rash.  Neurological:     General: No focal deficit present.     Mental Status: She is alert and oriented to person, place, and time.     Sensory: No sensory deficit.     Motor: No weakness or abnormal muscle tone.     Coordination: Coordination normal.  Psychiatric:        Mood and Affect: Mood normal.        Behavior: Behavior normal.      Imaging: No results found.

## 2024-03-11 ENCOUNTER — Other Ambulatory Visit: Payer: Self-pay

## 2024-03-11 ENCOUNTER — Encounter (HOSPITAL_BASED_OUTPATIENT_CLINIC_OR_DEPARTMENT_OTHER): Payer: Self-pay

## 2024-03-11 ENCOUNTER — Emergency Department (HOSPITAL_BASED_OUTPATIENT_CLINIC_OR_DEPARTMENT_OTHER)

## 2024-03-11 ENCOUNTER — Emergency Department (HOSPITAL_BASED_OUTPATIENT_CLINIC_OR_DEPARTMENT_OTHER)
Admission: EM | Admit: 2024-03-11 | Discharge: 2024-03-12 | Disposition: A | Discharge status: Home / Self Care | Attending: Emergency Medicine | Admitting: Emergency Medicine

## 2024-03-11 DIAGNOSIS — Z794 Long term (current) use of insulin: Secondary | ICD-10-CM | POA: Insufficient documentation

## 2024-03-11 DIAGNOSIS — Z79899 Other long term (current) drug therapy: Secondary | ICD-10-CM | POA: Insufficient documentation

## 2024-03-11 DIAGNOSIS — Z7982 Long term (current) use of aspirin: Secondary | ICD-10-CM | POA: Insufficient documentation

## 2024-03-11 DIAGNOSIS — R198 Other specified symptoms and signs involving the digestive system and abdomen: Secondary | ICD-10-CM | POA: Insufficient documentation

## 2024-03-11 DIAGNOSIS — J45909 Unspecified asthma, uncomplicated: Secondary | ICD-10-CM | POA: Insufficient documentation

## 2024-03-11 DIAGNOSIS — I10 Essential (primary) hypertension: Secondary | ICD-10-CM | POA: Insufficient documentation

## 2024-03-11 DIAGNOSIS — D72829 Elevated white blood cell count, unspecified: Secondary | ICD-10-CM | POA: Insufficient documentation

## 2024-03-11 DIAGNOSIS — E11649 Type 2 diabetes mellitus with hypoglycemia without coma: Secondary | ICD-10-CM | POA: Insufficient documentation

## 2024-03-11 DIAGNOSIS — E162 Hypoglycemia, unspecified: Secondary | ICD-10-CM | POA: Diagnosis present

## 2024-03-11 DIAGNOSIS — I2510 Atherosclerotic heart disease of native coronary artery without angina pectoris: Secondary | ICD-10-CM | POA: Diagnosis not present

## 2024-03-11 LAB — URINALYSIS, ROUTINE W REFLEX MICROSCOPIC
Bilirubin Urine: NEGATIVE
Glucose, UA: NEGATIVE mg/dL
Hgb urine dipstick: NEGATIVE
Ketones, ur: NEGATIVE mg/dL
Leukocytes,Ua: NEGATIVE
Nitrite: NEGATIVE
Protein, ur: NEGATIVE mg/dL
Specific Gravity, Urine: 1.01 (ref 1.005–1.030)
pH: 6 (ref 5.0–8.0)

## 2024-03-11 LAB — LIPASE, BLOOD: Lipase: 17 U/L (ref 11–51)

## 2024-03-11 LAB — COMPREHENSIVE METABOLIC PANEL WITH GFR
ALT: 10 U/L (ref 0–44)
AST: 19 U/L (ref 15–41)
Albumin: 4.1 g/dL (ref 3.5–5.0)
Alkaline Phosphatase: 155 U/L — ABNORMAL HIGH (ref 38–126)
Anion gap: 14 (ref 5–15)
BUN: 17 mg/dL (ref 8–23)
CO2: 23 mmol/L (ref 22–32)
Calcium: 9.6 mg/dL (ref 8.9–10.3)
Chloride: 99 mmol/L (ref 98–111)
Creatinine, Ser: 0.84 mg/dL (ref 0.44–1.00)
GFR, Estimated: >60 mL/min (ref >60)
Glucose, Bld: 137 mg/dL — ABNORMAL HIGH (ref 70–99)
Potassium: 4.2 mmol/L (ref 3.5–5.1)
Sodium: 136 mmol/L (ref 135–145)
Total Bilirubin: 0.4 mg/dL (ref 0.0–1.2)
Total Protein: 7.5 g/dL (ref 6.5–8.1)

## 2024-03-11 LAB — CBC
HCT: 40.8 % (ref 36.0–46.0)
Hemoglobin: 14.2 g/dL (ref 12.0–15.0)
MCH: 29.2 pg (ref 26.0–34.0)
MCHC: 34.8 g/dL (ref 30.0–36.0)
MCV: 84 fL (ref 80.0–100.0)
Platelets: 366 K/uL (ref 150–400)
RBC: 4.86 MIL/uL (ref 3.87–5.11)
RDW: 12 % (ref 11.5–15.5)
WBC: 13 K/uL — ABNORMAL HIGH (ref 4.0–10.5)
nRBC: 0 % (ref 0.0–0.2)

## 2024-03-11 LAB — CBG MONITORING, ED: Glucose-Capillary: 127 mg/dL — ABNORMAL HIGH (ref 70–99)

## 2024-03-11 MED ORDER — ACETAMINOPHEN 325 MG PO TABS
650.0000 mg | ORAL_TABLET | Freq: Once | ORAL | Status: AC
  Administered 2024-03-11: 650 mg via ORAL
  Filled 2024-03-11: qty 2

## 2024-03-11 MED ORDER — IOHEXOL 300 MG/ML  SOLN
125.0000 mL | Freq: Once | INTRAMUSCULAR | Status: AC | PRN
Start: 2024-03-11 — End: 2024-03-11
  Administered 2024-03-11: 125 mL via INTRAVENOUS

## 2024-03-11 NOTE — ED Notes (Signed)
 Patient reports she has a headache and would like medication for it.

## 2024-03-11 NOTE — ED Triage Notes (Addendum)
 Pt reports blood sugar 120 when she woke up and took her usual 60 units of Novolin. Since then sugar has been dropping periodically during day  At 2pm glucose 62. Ate a banana to bring it up. Pt reports poor appetite for 3 days. Having to force herself to eat. Reports lower abdominal pain/cramping for 3 days. Denies N/V/D.

## 2024-03-11 NOTE — ED Provider Notes (Signed)
 [place] [facility] AT [facility] Provider Note   CSN: [id number] Arrival date & time: 03/11/24  1724     Patient presents with: Hypoglycemia   Elizabeth Huber is a 62 y.o. female.   Hypoglycemia Patient is a 63 year old female presenting ED today for concerns for hypoglycemia starting acutely today, also concern for generalized abdominal pain that has been present for the last 3 days, accompanied with decreased appetite.  Notes that she spoke to her PCP who recommended changing her insulin  intake, noting that she should be going to 50 units in the morning and 40 at night of Novolin.  Previous medical history Diabetes, CAD, HLD, HSV, HTN, chronic headaches, asthma   Denies fever, headache, vision changes, chest pain, shortness of breath, cough, congestion, nausea, vomiting, diarrhea, dysuria, melena, hematochezia, lower leg swelling.  Prior to Admission medications   Medication Sig Start Date End Date Taking? Authorizing Provider  acetaminophen  (TYLENOL ) 500 MG tablet Take 500 mg by mouth every 6 (six) hours as needed for moderate pain or headache.     [provider]  aspirin  325 MG tablet Take 325 mg by mouth daily.    [provider]  atenolol  (TENORMIN ) 50 MG tablet Take 50 mg by mouth daily.    [provider]  atorvastatin  (LIPITOR) 40 MG tablet Take 40 mg by mouth daily. 06/09/23   [provider]  cyclobenzaprine  (FLEXERIL ) 10 MG tablet Take 1 tablet (10 mg total) by mouth 2 (two) times daily as needed for muscle spasms. Patient not taking: Reported on 11/09/2023 10/08/23   Victor Lynwood DASEN, PA-C  furosemide  (LASIX ) 40 MG tablet Take 1 tablet (40 mg total) by mouth daily. Patient taking differently: Take 40 mg by mouth 2 (two) times daily. 08/22/16   Ricky Fines, MD  gabapentin  (NEURONTIN ) 300 MG capsule Take 1 capsule (300 mg total) by mouth 3 (three) times daily. 11/09/23   Christine Rush, DPM  ibuprofen  (ADVIL ) 800 MG  tablet Take 800 mg by mouth every 8 (eight) hours as needed. 10/26/20   [provider]  insulin  NPH-regular (NOVOLIN 70/30) (70-30) 100 UNIT/ML injection Inject 60 Units into the skin 2 (two) times daily with a meal.    [provider]  lidocaine  (LIDODERM ) 5 % Place 1 patch onto the skin daily. Remove & Discard patch within 12 hours or as directed by MD 10/08/23   Victor Lynwood DASEN, PA-C  loratadine  (CLARITIN ) 10 MG tablet Take 1 tablet (10 mg total) by mouth daily. 08/22/16   Ricky Fines, MD  losartan  (COZAAR ) 25 MG tablet Take 25 mg by mouth daily.    [provider]  omeprazole  (PRILOSEC) 20 MG capsule Take 20 mg by mouth daily.    [provider]    Allergies: Hydrocodone, Oxycodone , Amoxicillin-pot clavulanate, Exenatide, Metformin hcl, Morphine  and codeine, Tradjenta [linagliptin], and Vicodin [hydrocodone-acetaminophen ]    Review of Systems  Updated Vital Signs BP 137/61   Pulse 72   Temp (!) 97 F (36.1 C)   Resp 18   Wt (!) 150.1 kg   LMP 04/22/2011   SpO2 93%   BMI 51.84 kg/m   Physical Exam Vitals and nursing note reviewed.  Constitutional:      General: She is not in acute distress.    Appearance: Normal appearance. She is not ill-appearing or diaphoretic.  HENT:     Head: Normocephalic and atraumatic.  Eyes:     General: No scleral icterus.  Right eye: No discharge.        Left eye: No discharge.     Extraocular Movements: Extraocular movements intact.     Conjunctiva/sclera: Conjunctivae normal.  Cardiovascular:     Rate and Rhythm: Normal rate and regular rhythm.     Pulses: Normal pulses.     Heart sounds: Normal heart sounds. No murmur heard.    No friction rub. No gallop.  Pulmonary:     Effort: Pulmonary effort is normal. No respiratory distress.     Breath sounds: No stridor. No wheezing, rhonchi or rales.  Chest:     Chest wall: No tenderness.  Abdominal:     General: Abdomen is flat. There is no distension.      Palpations: Abdomen is soft.     Tenderness: There is abdominal tenderness (periumbilical, generalized). There is no right CVA tenderness, left CVA tenderness, guarding or rebound.  Musculoskeletal:        General: No swelling, deformity or signs of injury.     Cervical back: Normal range of motion. No rigidity.     Right lower leg: No edema.     Left lower leg: No edema.  Skin:    General: Skin is warm and dry.     Findings: No bruising, erythema or lesion.  Neurological:     General: No focal deficit present.     Mental Status: She is alert and oriented to person, place, and time. Mental status is at baseline.     Sensory: No sensory deficit.     Motor: No weakness.  Psychiatric:        Mood and Affect: Mood normal.     (all labs ordered are listed, but only abnormal results are displayed) Labs Reviewed  COMPREHENSIVE METABOLIC PANEL WITH GFR - Abnormal; Notable for the following components:      Result Value   Glucose, Bld 137 (*)    Alkaline Phosphatase 155 (*)    All other components within normal limits  CBC - Abnormal; Notable for the following components:   WBC 13.0 (*)    All other components within normal limits  CBG MONITORING, ED - Abnormal; Notable for the following components:   Glucose-Capillary 127 (*)    All other components within normal limits  LIPASE, BLOOD  URINALYSIS, ROUTINE W REFLEX MICROSCOPIC    EKG: None  Radiology: CT ABDOMEN PELVIS W CONTRAST Result Date: 03/11/2024 EXAM: CT ABDOMEN AND PELVIS WITH CONTRAST 03/11/2024 09:50:00 PM TECHNIQUE: CT of the abdomen and pelvis was performed with the administration of 125 mL of iohexol  (OMNIPAQUE ) 300 MG/ML solution. Multiplanar reformatted images are provided for review. Automated exposure control, iterative reconstruction, and/or weight-based adjustment of the mA/kV was utilized to reduce the radiation dose to as low as reasonably achievable. COMPARISON: CT abdomen and pelvis. CLINICAL HISTORY:  Abdominal pain, acute, nonlocalized. Pt reports blood sugar 120 when she woke up and took her usual 60 units of Novolin. Since then sugar has been dropping periodically during day. At 2pm glucose 62. Ate a banana to bring it up. Pt reports poor appetite for 3 days. Having to force herself to eat. Reports lower abdominal pain/cramping for 3 days. Denies N/V/D. FINDINGS: LOWER CHEST: No acute abnormality. LIVER: The liver is unremarkable. GALLBLADDER AND BILE DUCTS: Gallbladder is unremarkable. No biliary ductal dilatation. SPLEEN: No acute abnormality. PANCREAS: No acute abnormality. ADRENAL GLANDS: No acute abnormality. KIDNEYS, URETERS AND BLADDER: No stones in the kidneys or ureters. No hydronephrosis. No perinephric or periureteral  stranding. Urinary bladder is unremarkable. GI AND BOWEL: Stomach demonstrates no acute abnormality. There is no bowel obstruction. There is sigmoid colon diverticulosis. Appendix is within normal limits. PERITONEUM AND RETROPERITONEUM: No ascites. No free air. VASCULATURE: Aorta is normal in caliber. LYMPH NODES: No lymphadenopathy. REPRODUCTIVE ORGANS: An IUD is in the uterus. Fluid-filled tubular structure in the left adnexa persists measuring up to 13 mm. BONES AND SOFT TISSUES: No acute osseous abnormality. No focal soft tissue abnormality. IMPRESSION: 1. No acute findings. 2. Sigmoid colon diverticulosis without evidence of diverticulitis. 3. IUD in the uterus. 4. Stable Left adnexal fluid-filled tubular structure measuring up to 13 mm worrisome for a hydrosalpinx. recommend further evaluation with pelvic ultrasound. Electronically signed by: Greig Pique MD 03/11/2024 09:58 PM EDT RP Workstation: [id number]     Procedures   Medications Ordered in the ED  acetaminophen  (TYLENOL ) tablet 650 mg (650 mg Oral Given 03/11/24 2051)  iohexol  (OMNIPAQUE ) 300 MG/ML solution 125 mL (125 mLs Intravenous Contrast Given 03/11/24 2149)    Clinical Course as of 03/12/24 0004  Fri  Mar 11, 2024  2253 Spoke with [facility] radiologist Dr. Boyd about findings on imaging, noting that lesion suspicious for hydrosalpinx is stable with no acute changes. [CB]    Clinical Course User Index [CB] Elizabeth Terrall RAMAN, PA-C                                 Medical Decision Making Amount and/or Complexity of Data Reviewed Labs: ordered. Radiology: ordered.  Risk OTC drugs. Prescription drug management.   This patient is a 63 year old female who presents to the ED for concern of hypoglycemia noting that she had talked to her PCP today and was told to lower her insulin  dosing after noting she had low blood sugar today but came in because she was having mild periumbilical abdominal pain with decreased appetite accompanying the hypoglycemia and wished to be evaluated immediately..  On physical exam, patient is in no acute distress, afebrile, alert and orient x 4, speaking in full sentences, nontachypneic, nontachycardic.  Notably has some mild periumbilical tenderness to palpation, with no rebound tenderness and no guarding.  Exam is otherwise unremarkable.   lab work and imaging were done, labs showed baseline white blood count with elevated baseline.  Spoke to [facility] radiology who noted that her imaging had not changed at all from previous.  And that this could likely be followed up in the outpatient setting with clinical picture.  With patient not having any vaginal complaints, no lower abdominal tenderness.  Will have her continue to follow-up with OB/GYN, PCP for further management.  Low suspicion for any emergent cause of her symptoms today.  Patient vital signs have remained stable throughout the course of patient's time in the ED. Low suspicion for any other emergent pathology at this time. I believe this patient is safe to be discharged. Provided strict return to ER precautions. Patient expressed agreement and understanding of plan. All questions were  answered.  Differential diagnoses prior to evaluation: The emergent differential diagnosis includes, but is not limited to,  Mesenteric ischemia, diverticulitis, nephrolithiasis, constipation, bowel obstruction, IBD,  ectopic pregnancy, ovarian torsion, PID, hypoglycemia, metabolic disturbance. This is not an exhaustive differential.   Past Medical History / Co-morbidities / Social History: Diabetes, CAD, HLD, HSV, HTN, chronic headaches, asthma  Additional history: Chart reviewed. Pertinent results include: Last seen by PCP yesterday for type 2 diabetes  Lab  Tests/Imaging studies: I personally interpreted labs/imaging and the pertinent results include: CBC notes elevated white count of 13.0 similar to previous CMP notes an elevated alk phos 155 but otherwise unremarkable UA unremarkable Lipase unremarkable CT abdomen does note some stable hydrosalpinx like findings.  I agree with the radiologist interpretation.  Medications: I ordered medication including Tylenol .  I have reviewed the patients home medicines and have made adjustments as needed.  Critical Interventions: None  Social Determinants of Health: None  Disposition: After consideration of the diagnostic results and the patients response to treatment, I feel that the patient would benefit from discharge and treatment as above.   emergency department workup does not suggest an emergent condition requiring admission or immediate intervention beyond what has been performed at this time. The plan is: Follow-up with PCP, follow-up with OB/GYN, return to the ER for new or worsening symptoms. The patient is safe for discharge and has been instructed to return immediately for worsening symptoms, change in symptoms or any other concerns.   Final diagnoses:  Hypoglycemia  Left pelvic adnexal fluid collection    ED Discharge Orders     None          Elizabeth Huber, [place] 03/12/24 0004    Lenor Hollering, MD 03/12/24  1500

## 2024-03-12 NOTE — Discharge Instructions (Signed)
 You are seen today for low blood sugar.  Recommend you continue to manage blood sugar as prescribed by your PCP with new insulin  dosing.  On RN exam, did notes fluid to your left adnexal region which will need to be evaluated by OB/GYN.  I have provided information for an OB/GYN for you to follow-up with their office to be evaluated.  Additionally recommend you continue to follow-up with your PCP for more blood sugar management.  You will need to also have a repeat CBC done to evaluate your elevated white count.  Otherwise your physical exam and imaging were very reassuring at this time.

## 2024-03-17 ENCOUNTER — Emergency Department (HOSPITAL_COMMUNITY)
Admission: EM | Admit: 2024-03-17 | Discharge: 2024-03-17 | Disposition: A | Discharge status: Home / Self Care | Attending: Emergency Medicine | Admitting: Emergency Medicine

## 2024-03-17 ENCOUNTER — Emergency Department (HOSPITAL_COMMUNITY)

## 2024-03-17 DIAGNOSIS — Z79899 Other long term (current) drug therapy: Secondary | ICD-10-CM | POA: Diagnosis not present

## 2024-03-17 DIAGNOSIS — Z7982 Long term (current) use of aspirin: Secondary | ICD-10-CM | POA: Diagnosis not present

## 2024-03-17 DIAGNOSIS — K2100 Gastro-esophageal reflux disease with esophagitis, without bleeding: Secondary | ICD-10-CM | POA: Diagnosis not present

## 2024-03-17 DIAGNOSIS — E119 Type 2 diabetes mellitus without complications: Secondary | ICD-10-CM | POA: Insufficient documentation

## 2024-03-17 DIAGNOSIS — I10 Essential (primary) hypertension: Secondary | ICD-10-CM | POA: Insufficient documentation

## 2024-03-17 DIAGNOSIS — Z794 Long term (current) use of insulin: Secondary | ICD-10-CM | POA: Insufficient documentation

## 2024-03-17 DIAGNOSIS — R1013 Epigastric pain: Secondary | ICD-10-CM | POA: Diagnosis present

## 2024-03-17 LAB — URINALYSIS, ROUTINE W REFLEX MICROSCOPIC
Bilirubin Urine: NEGATIVE
Glucose, UA: NEGATIVE mg/dL
Hgb urine dipstick: NEGATIVE
Ketones, ur: NEGATIVE mg/dL
Leukocytes,Ua: NEGATIVE
Nitrite: NEGATIVE
Protein, ur: NEGATIVE mg/dL
Specific Gravity, Urine: 1.001 — ABNORMAL LOW (ref 1.005–1.030)
pH: 6 (ref 5.0–8.0)

## 2024-03-17 LAB — COMPREHENSIVE METABOLIC PANEL WITH GFR
ALT: 10 U/L (ref 0–44)
AST: 23 U/L (ref 15–41)
Albumin: 3.8 g/dL (ref 3.5–5.0)
Alkaline Phosphatase: 167 U/L — ABNORMAL HIGH (ref 38–126)
Anion gap: 11 (ref 5–15)
BUN: 12 mg/dL (ref 8–23)
CO2: 24 mmol/L (ref 22–32)
Calcium: 9.2 mg/dL (ref 8.9–10.3)
Chloride: 98 mmol/L (ref 98–111)
Creatinine, Ser: 0.7 mg/dL (ref 0.44–1.00)
GFR, Estimated: >60 mL/min (ref >60)
Glucose, Bld: 120 mg/dL — ABNORMAL HIGH (ref 70–99)
Potassium: 3.8 mmol/L (ref 3.5–5.1)
Sodium: 133 mmol/L — ABNORMAL LOW (ref 135–145)
Total Bilirubin: 0.6 mg/dL (ref 0.0–1.2)
Total Protein: 7.5 g/dL (ref 6.5–8.1)

## 2024-03-17 LAB — CBC
HCT: 39.5 % (ref 36.0–46.0)
Hemoglobin: 13.2 g/dL (ref 12.0–15.0)
MCH: 28.6 pg (ref 26.0–34.0)
MCHC: 33.4 g/dL (ref 30.0–36.0)
MCV: 85.7 fL (ref 80.0–100.0)
Platelets: 370 K/uL (ref 150–400)
RBC: 4.61 MIL/uL (ref 3.87–5.11)
RDW: 12 % (ref 11.5–15.5)
WBC: 13.7 K/uL — ABNORMAL HIGH (ref 4.0–10.5)
nRBC: 0 % (ref 0.0–0.2)

## 2024-03-17 LAB — LIPASE, BLOOD: Lipase: 18 U/L (ref 11–51)

## 2024-03-17 MED ORDER — PANTOPRAZOLE SODIUM 40 MG PO TBEC
40.0000 mg | DELAYED_RELEASE_TABLET | Freq: Once | ORAL | Status: AC
  Administered 2024-03-17: 40 mg via ORAL
  Filled 2024-03-17: qty 1

## 2024-03-17 MED ORDER — ALUM & MAG HYDROXIDE-SIMETH 200-200-20 MG/5ML PO SUSP
30.0000 mL | Freq: Once | ORAL | Status: AC
  Administered 2024-03-17: 30 mL via ORAL
  Filled 2024-03-17: qty 30

## 2024-03-17 NOTE — ED Triage Notes (Signed)
 BIB EMS for epigastric pain radiating to her back. General malaise. Excessive thirst and increased urination. Odorous, concerned for UTI. CBG 127. 120/60  Seen for similar 1 week ago.  Given 300NS and 4mg  Zofran .

## 2024-03-17 NOTE — ED Provider Notes (Signed)
 [place] [facility] AT [facility] Provider Note   CSN: [id number] Arrival date & time: 03/17/24  1520     Patient presents with: Weakness and Abdominal Pain   Elizabeth Huber is a 63 y.o. female.  {Add pertinent medical, surgical, social history, OB history to YEP:67052} Patient with a history of GERD and diabetes and hypertension.  She complains of epigastric discomfort   Weakness Associated symptoms: abdominal pain   Abdominal Pain      Prior to Admission medications   Medication Sig Start Date End Date Taking? Authorizing Provider  acetaminophen  (TYLENOL ) 500 MG tablet Take 500 mg by mouth every 6 (six) hours as needed for moderate pain or headache.     [provider]  aspirin  325 MG tablet Take 325 mg by mouth daily.    [provider]  atenolol  (TENORMIN ) 50 MG tablet Take 50 mg by mouth daily.    [provider]  atorvastatin  (LIPITOR) 40 MG tablet Take 40 mg by mouth daily. 06/09/23   [provider]  cyclobenzaprine  (FLEXERIL ) 10 MG tablet Take 1 tablet (10 mg total) by mouth 2 (two) times daily as needed for muscle spasms. Patient not taking: Reported on 11/09/2023 10/08/23   Victor Agent T, PA-C  furosemide  (LASIX ) 40 MG tablet Take 1 tablet (40 mg total) by mouth daily. Patient taking differently: Take 40 mg by mouth 2 (two) times daily. 08/22/16   Ricky Fines, MD  gabapentin  (NEURONTIN ) 300 MG capsule Take 1 capsule (300 mg total) by mouth 3 (three) times daily. 11/09/23   Christine Rush, DPM  ibuprofen  (ADVIL ) 800 MG tablet Take 800 mg by mouth every 8 (eight) hours as needed. 10/26/20   [provider]  insulin  NPH-regular (NOVOLIN 70/30) (70-30) 100 UNIT/ML injection Inject 60 Units into the skin 2 (two) times daily with a meal.    [provider]  lidocaine  (LIDODERM ) 5 % Place 1 patch onto the skin daily. Remove & Discard patch within 12 hours or as directed by MD 10/08/23   Victor Agent DASEN, PA-C  loratadine  (CLARITIN ) 10 MG tablet Take 1 tablet (10 mg total) by mouth daily. 08/22/16   Ricky Fines, MD  losartan  (COZAAR ) 25 MG tablet Take 25 mg by mouth daily.    [provider]  omeprazole  (PRILOSEC) 20 MG capsule Take 20 mg by mouth daily.    [provider]    Allergies: Hydrocodone, Oxycodone , Amoxicillin-pot clavulanate, Exenatide, Metformin hcl, Morphine  and codeine, Tradjenta [linagliptin], and Vicodin [hydrocodone-acetaminophen ]    Review of Systems  Gastrointestinal:  Positive for abdominal pain.  Neurological:  Positive for weakness.    Updated Vital Signs BP (!) 144/59   Pulse 61   Temp 97.9 F (36.6 C) (Oral)   Resp 19   LMP 04/22/2011   SpO2 98%   Physical Exam  (all labs ordered are listed, but only abnormal results are displayed) Labs Reviewed  COMPREHENSIVE METABOLIC PANEL WITH GFR - Abnormal; Notable for the following components:      Result Value   Sodium 133 (*)    Glucose, Bld 120 (*)    Alkaline Phosphatase 167 (*)    All other components within normal limits  CBC - Abnormal; Notable for the following components:   WBC 13.7 (*)    All other components within normal limits  URINALYSIS, ROUTINE W REFLEX MICROSCOPIC - Abnormal; Notable for the following components:   Color, Urine COLORLESS (*)    Specific Gravity, Urine  1.001 (*)    All other components within normal limits  LIPASE, BLOOD    EKG: None  Radiology: DG Abdomen Acute W/Chest Result Date: 03/17/2024 CLINICAL DATA:  Chest pain. EXAM: DG ABDOMEN ACUTE WITH 1 VIEW CHEST COMPARISON:  Chest radiograph dated 09/30/2023. FINDINGS: Evaluation is limited due to body habitus. No focal consolidation, pleural effusion, pneumothorax. The cardiac silhouette is within limits. No bowel dilatation or evidence of obstruction. No free air a calculi. No acute osseous pathology. IMPRESSION: 1. No acute cardiopulmonary process. 2. Nonobstructive bowel gas pattern.  Electronically Signed   By: Vanetta Chou M.D.   On: 03/17/2024 20:19    {Document cardiac monitor, telemetry assessment procedure when appropriate:32947} Procedures   Medications Ordered in the ED  pantoprazole  (PROTONIX ) EC tablet 40 mg (40 mg Oral Given 03/17/24 1951)  alum & mag hydroxide-simeth (MAALOX/MYLANTA) 200-200-20 MG/5ML suspension 30 mL (30 mLs Oral Given 03/17/24 1951)      {Click here for ABCD2, HEART and other calculators REFRESH Note before signing:1}                              Medical Decision Making Amount and/or Complexity of Data Reviewed Labs: ordered. Radiology: ordered.  Risk OTC drugs. Prescription drug management.   Patient with worsening GERD symptoms.  She will increase her Prilosec to 40 mg a day and follow-up with her PCP  {Document critical care time when appropriate  Document review of labs and clinical decision tools ie CHADS2VASC2, etc  Document your independent review of radiology images and any outside records  Document your discussion with family members, caretakers and with consultants  Document social determinants of health affecting pt's care  Document your decision making why or why not admission, treatments were needed:32947:::1}   Final diagnoses:  Gastroesophageal reflux disease with esophagitis without hemorrhage    ED Discharge Orders     None

## 2024-03-17 NOTE — Discharge Instructions (Signed)
 Increase your Prilosec so you are taking 40 mg a day and follow-up with your family doctor in 1 to 2 weeks

## 2024-03-18 NOTE — ED Provider Notes (Signed)
 Emergency Department Provider Note  Dragon voice dictation used for charting.    Provider at bedside: 8:26 AM  History obtained from the: Patient  History   Chief Complaint  Patient presents with  . Headache     HPI  Elizabeth Huber is a 63 y.o. female with a PMH of DM2 and HTN who presents to the ED with multiple complaints.  Patient was seen at [facility] ED yesterday for heartburn type symptoms.  When she woke up this morning, she states she had a 10 out of 10 headache, felt slightly nauseated, was burping copiously, and also felt slightly short of breath.  Additionally, the patient endorses intermittent pains to the jaw and to the left upper extremity.  She denies any fever, chest pain, diaphoresis, abdominal pain, or leg swelling.  She also endorses a dry cough as well.  She did not take anything for her pain prior to coming to the hospital.    No LMP recorded.   Past Medical History Medical History[1]  Past Surgical History Surgical History[2]    Allergies Allergies[3]   Family History Family History[4]   Social History Social History[5]    Physical Exam   Vitals:   03/21/24 0811 03/21/24 1134 03/21/24 1428 03/21/24 1608  BP: 121/64 (!) 113/53 135/60 145/66  BP Location: Right arm Right arm Right arm Right arm  Patient Position: Lying Lying Lying Lying  Pulse: 79 78 79 81  Resp: 18 18 18 18   Temp: 97.7 F (36.5 C) 97.3 F (36.3 C) 97.9 F (36.6 C) 97.9 F (36.6 C)  TempSrc: Oral Oral Oral Oral  SpO2: 94% 96% 96% 96%  Weight:      Height:        Physical Exam Vitals and nursing note reviewed.  Constitutional:      General: She is not in acute distress.    Appearance: She is well-developed. She is not toxic-appearing.  HENT:     Head: Normocephalic and atraumatic.     Right Ear: External ear normal.     Left Ear: External ear normal.     Nose: Nose normal.     Mouth/Throat:     Mouth: Mucous membranes are moist.     Pharynx:  Oropharynx is clear.  Eyes:     General: Lids are normal.     Extraocular Movements: Extraocular movements intact.     Conjunctiva/sclera: Conjunctivae normal.     Pupils: Pupils are equal, round, and reactive to light.  Cardiovascular:     Rate and Rhythm: Normal rate and regular rhythm.     Pulses: Normal pulses.     Heart sounds: Normal heart sounds.  Pulmonary:     Effort: Pulmonary effort is normal.     Breath sounds: Normal breath sounds.  Abdominal:     Palpations: Abdomen is soft.     Tenderness: There is no abdominal tenderness.  Musculoskeletal:     Right lower leg: No edema.     Left lower leg: No edema.  Skin:    General: Skin is warm and dry.  Neurological:     General: No focal deficit present.     Mental Status: She is alert and oriented to person, place, and time.     Cranial Nerves: No cranial nerve deficit.  Psychiatric:        Mood and Affect: Mood normal.        Behavior: Behavior normal.     Labs   Lab Results (last 24  hours)     ** No results found for the last 24 hours. **         Radiology   Radiology Results (last 72 hours)     Procedure Component Value Units Date/Time   NM Cardiac SPECT Multi (Rest and Stress) [[id number]] Collected: 03/21/24 1142   Order Status: Completed Updated: 03/21/24 1201   Narrative:     NUCLEAR MYOCARDIAL PERFUSION IMAGING WITH SPECT, 03/21/2024 11:17 AM  INDICATION: Chest pain/anginal equiv, low CAD risk, not treadmill candidate  COMPARISON: None.  The patient denied having consumed any caffeinated and de-caffeinated beverage and food during the 24 hours preceding the pharmacologic stress test.  TECHNIQUE: A Lexiscan stress protocol was used. 12.9 mCi of Tc-63m Tetrofosmin was administered intravenously at rest and 43.9 mCi was administered intravenously at stress. Gated SPECT images were obtained and processed.  LIMITATIONS: None.  FINDINGS:  Unexpected: None.  Defect: Small size, mild severity,  reversible perfusion defect involving the apex and apical lateral segment. No acute findings in the companion low dose noncontrast CT. TID: <1.25  Gated rest images:  End diastolic volume: 97 cc  End systolic volume: 30 cc  Ejection fraction: 69%.  Wall motion abnormalities: None.  Gated stress images:  End diastolic volume: 74 cc  End systolic volume: 23 cc  Ejection fraction: 69%.  Wall motion abnormalities: None.    Impression:     1.) LIMITATIONS: None. 2.) MYOCARDIAL PERFUSION: Small, reversible perfusion defect involving the apex and apical lateral segment. 3.) LEFT VENTRICULAR EJECTION FRACTION: Normal. 4.) REGIONAL WALL MOTION: Normal.        EKG     Encounter Date: 03/18/24  ECG 12 lead   Narrative   Ventricular Rate                   67        BPM                  Atrial Rate                        67        BPM                  P-R Interval                       140       ms                   QRS Duration                       96        ms                   Q-T Interval                       442       ms                   QTC Calculation Bazett             467       ms                   Calculated P Axis  10        degrees              Calculated R Axis                  19        degrees              Calculated T Axis                  25        degrees               Sinus rhythm Normal ECG No previous ECGs available Confirmed by Launie Stanford  [id number]  on 03-18-2024 12 41 14 PM     ED Course   ED Course as of 03/23/24 1628  Fri Mar 18, 2024  1304 Cath 10/13/23.  Mixed stenosis   [AS]  1307 [facility] hospitalist [AS]    ED Course User Index [AS] Peyton PARAS Schlagheck, PA-C    Procedure Note   Procedures  Medical Decision Making   Clinical Complexity  Patient's presentation is most consistent with acute presentation with potential threat to life or bodily function.    Provider time spent in patient care today,  inclusive of but not limited to clinical reassessment, review of diagnostic studies, and discharge preparation, was greater than 30 minutes.    Medical Decision Making Problems Addressed: Chest pain, unspecified type: complicated acute illness or injury  Amount and/or Complexity of Data Reviewed Labs: ordered. Decision-making details documented in ED Course. Radiology: ordered and independent interpretation performed. Decision-making details documented in ED Course. ECG/medicine tests: ordered and independent interpretation performed. Decision-making details documented in ED Course.  Risk OTC drugs. Prescription drug management. Decision regarding hospitalization.     Differential diagnosis includes but is not limited to DDX: MI, angina, aortic dissection, pneumonia, PE, pneumothorax, musculoskeletal pain, cardiomyopathy, bronchitis, COPD, CHF, dyspepsia, gastric ulcer disease, esophagitis, plueural effusion, sarcoidosis, atypical pneumonia, TB   ED Clinical Impression   1. Chest pain, unspecified type      ED Assessment/Plan  Patient presented to the ED with multiple complaints.  Upon arrival to patient, she was resting on the hospital bed in no acute distress.  Physical exam was remarkable for the above findings.  CBC was unremarkable.  CMP was markable for hyperglycemia of 127 with no anion gap elevation as well as elevated alk phos of 140.  BNP was normal and serial troponins were not elevated.  COVID, flu, and RSV tests were negative.  EKG showed normal sinus rhythm.  Per my viewing, I did not note any acute ischemic findings to indicate a STEMI.  Chest x-ray showed no active cardiopulmonary disease.  Per my viewing, I did not note any acute findings.   Patient has an elevated heart score and given her symptoms, I am concerned for the possibility of ACS.  Given that is the weekend, stress test cannot happen until Monday, so patient will need to be admitted by hospitalist.  I later  spoke with Elberta who agreed to assess and admit the patient.  I also placed a consult to cardiology asking them to evaluate pending admission.  ED Meds Given During Visit Medications  ondansetron  (ZOFRAN -ODT) disintegrating tablet 4 mg (4 mg oral Given 03/18/24 0938)  acetaminophen  (TYLENOL ) tablet 1,000 mg (1,000 mg oral Given 03/18/24 0938)  lidocaine  (XYLOCAINE ) 2 % viscous solution 10 mL (10 mL Mouth/Throat Given 03/18/24 1111)  alum-mag hydroxide-simethicone  (MAALOX,  MYLANTA) 200-200-20 mg/5 mL suspension 30 mL (30 mL oral Given 03/18/24 1111)  aspirin  chewable tablet 324 mg (324 mg oral Given 03/18/24 1340)  omeprazole  (PriLOSEC) DR capsule 40 mg (40 mg oral Given 03/20/24 2046)  tc80m tetrofosmin (MYOVIEW) injection 12.9 millicurie (12.9 millicuries intravenous Given 03/21/24 0835)  regadenoson (LEXISCAN) injection 0.4 mg (0.4 mg intravenous Given 03/21/24 0930)  tc62m tetrofosmin (MYOVIEW) injection 43.9 millicurie (43.9 millicuries intravenous Given 03/21/24 0949)      Discharge Medication List as of 03/21/2024  4:13 PM        FOLLOW UP No follow-up provider specified.  Electronically signed by: 8:26 AM 03/23/2024 for Allyson Schlagheck PA-C        [1] No past medical history on file. [2] No past surgical history on file. [3] No Known Allergies [4] No family history on file. [5] Social History Tobacco Use  . Smoking status: Never  . Smokeless tobacco: Never

## 2024-03-21 ENCOUNTER — Encounter: Payer: Self-pay | Admitting: Radiology

## 2024-03-21 NOTE — Progress Notes (Signed)
 Case Management Discharge Note        CSN: [id number] DOB: 09/23/1960 Service: General Medicine Location: 514/01  Patient Class: Inpatient  DC Disposition: : Home or Self Care  Discharge DC Disposition: : Home or Self Care  Discharge Referrals Patient Preference: Chosen geographical local area/county shared with patient/family: Return/previous involvement Patient Preference for Post-Acute Provider Form completed: Return/Previous Involvement Case closed, patient/family agree with disposition plan: Yes       Case Management Coordination Status: Coordination Complete      Aleck Benders MSW, LCSW-A  [phone]

## 2024-03-21 NOTE — Discharge Summary (Signed)
 Hospital Medicine Discharge Summary   Demographics: Elizabeth Huber  63 y.o. [birth year] MRN: [id number]    Extended Emergency Contact Information Primary Emergency Contact: Madomna,Donald Mobile Phone: [phone] Relation: Jerrye Parent  Full Code Predictive Model Details        7.4% (Low)  Factor Value   Calculated 03/21/2024 16:01 8% Number of hospitalizations in last year 0   Readmission Risk Score v2 Model 7% Latest RDW in last 72 hrs 13.3 %   *Archived Data 7% Braden score 20    6% Latest hemoglobin in last 72 hrs 13 g/dL    6% Number of ED visits in last 90 days 1     Admit Date: 03/18/2024                            Attending Physician: Erica Heddy Lia, MD Discharge Date: 03/21/2024  Primary Care Provider: No Pcp   None  Consults during this admission: Consult Orders             IP CONSULT TO CARDIOLOGY       Specialty:  Cardiology  Provider:  (Not yet assigned)      IP CONSULT TO CARDIOLOGY       Specialty:  Cardiology  Provider:  (Not yet assigned)      IP CONSULT TO HOSPITALIST       Provider:  (Not yet assigned)              Active & Resolved Diagnosis: Principal Problem:   Chest pain due to CAD Active Problems:   GERD without esophagitis   History of left heart catheterization   Type 2 diabetes mellitus, with long-term current use of insulin    Mixed hyperlipidemia   Essential hypertension   Morbid obesity with body mass index (BMI) of 50.0 to 59.9 in adult (CMD) Resolved Problems:   * No resolved hospital problems. *   Disposition: Patient discharged to Home in stable condition.   Discharge follow-up recommendations : See Hospital Course    Hospital Course: Elizabeth Huber, age 37, with a history of coronary artery disease, hypertension, hyperlipidemia, type 2 diabetes mellitus (insulin -dependent), and morbid obesity, was admitted for evaluation of intermittent chest pain, jaw pain, and left arm pain, with chronic  exertional dyspnea and a prior left heart catheterization showing mild, non-obstructive coronary artery disease.  On admission, her cardiac workup was reassuring, including a normal EKG, negative serial troponins, and a chest X-ray without acute findings. Cardiology consultation noted her symptoms were chronic and previously evaluated with catheterization, and recommended outpatient nuclear stress testing unless she preferred inpatient monitoring. During hospitalization, she underwent a nuclear stress test, which demonstrated a partially reversible defect in the anterior wall and apex, interpreted by cardiology as likely attenuation artifact due to body habitus, consistent with prior false positive findings; no further cardiac testing or changes in management were recommended, and her symptoms were deemed noncardiac in origin. Her metoprolol  was held for the nuclear stress test.  Her chest pain resolved during admission, and she remained hemodynamically stable and without new complaints. Secondary issues addressed included GERD, for which she continued omeprazole ; type 2 diabetes, for which her insulin  regimen was transitioned from NPH/regular to glargine 30 units twice daily with sliding scale coverage and pre-meal glucose monitoring; and morbid obesity, with education provided regarding diet and exercise. Her A1c was 6.2%. She received DVT prophylaxis with enoxaparin . No urgent GI evaluation was indicated for GERD.  She was discharged home in stable condition with follow-up to her outpatient cardiologist recommended.                Wound / Incision Assessment: Refer to Chart Review and Media Tab for images if available.      Temp:  [97.3 F (36.3 C)-97.9 F (36.6 C)] 97.9 F (36.6 C) Heart Rate:  [74-82] 79 Resp:  [18] 18 BP: (113-135)/(53-76) 135/60      Discharge Medications     Medications To Continue      Sig Disp Refill Start End  artificial tears(hypromellose) 0.5 %  Drop Commonly known as: ISOPTO  Administer 1 drop into both eyes 4 (four) times a day as needed.   0     ascorbic acid 500 mg tablet Commonly known as: VITAMIN C  Take 500 mg by mouth daily.   0     aspirin  325 mg tablet  Take 325 mg by mouth at bedtime.   0     atenoloL  50 mg tablet Commonly known as: TENORMIN   Take 50 mg by mouth daily.   0     atorvastatin  80 mg tablet Commonly known as: LIPITOR  Take 80 mg by mouth every other day.   0     dextromethorphan-guaifenesin  10-100 mg/5 mL liquid Commonly known as: ROBITUSSIN-DM  Take 10 mL by mouth 3 (three) times a day as needed.   0     furosemide  40 mg tablet Commonly known as: LASIX   Take 40 mg by mouth 2 (two) times a day.   0     ibuprofen  800 mg tablet Commonly known as: MOTRIN   Take 800 mg by mouth every 8 (eight) hours as needed.   0     * insulin  NPH-insulin  regular 100 unit/mL (70-30) injection Commonly known as: HumuLIN 70/30, NovoLIN 70/30  Inject 50 Units under the skin every morning.   0     * insulin  NPH-insulin  regular 100 unit/mL (70-30) injection Commonly known as: HumuLIN 70/30, NovoLIN 70/30  Inject 40 Units under the skin every evening.   0     loratadine  10 mg tablet Commonly known as: CLARITIN   Take 10 mg by mouth daily as needed for allergies.   0     losartan  25 mg tablet Commonly known as: COZAAR   Take 25 mg by mouth daily.   0     multivit-iron-folic acid-calcium -mins 9 mg iron-400 mcg tablet Commonly known as: THERA-M  Take 1 tablet by mouth daily.   0     omeprazole  40 mg DR capsule Commonly known as: PriLOSEC  Take 40 mg by mouth daily.   0        * * There are duplicate medications prescribed to the patient          Discharge Orders     Discharge Diet (specify)     Details:    Diet type: Consistent Carbohydrate   Full Code     Lifting Limits:     Details:    Lifting Limits: No lifting limits         Lab Results  Component Value Date/Time   HGB  13.0 03/18/2024 05:41 PM   HCT 37.6 03/18/2024 05:41 PM   WBC 10.27 03/18/2024 05:41 PM   PLT 316 03/18/2024 05:41 PM   Lab Results  Component Value Date/Time   NA 137 03/21/2024 04:48 AM   K 3.6 03/21/2024 04:48 AM   CREATININE 0.62 03/21/2024 04:48 AM   BUN  12 03/21/2024 04:48 AM   GLUCOSE 134 (H) 03/21/2024 04:48 AM    Pertinent Imaging: XR Chest 1 View  Final Result by Ozell Ray Essex, MD (10/31 1412)  XR CHEST 1 VIEW, 03/18/2024 7:42 AM    INDICATION:shortness of breath   COMPARISON: None    FINDINGS:     Supportive devices: EKG leads.  Cardiovascular/lungs/pleura: Cardiac silhouette and pulmonary vasculature   are within normal limits. Lungs are clear. No pleural effusion or   pneumothorax.   Other: Unremarkable.    IMPRESSION:  There is no evidence of acute cardiac or pulmonary abnormality.          Electronically signed by: Erica MALVA Lia, MD 03/21/2024 3:52 PM   Time spent on discharge: 31 minutes *Some images could not be shown.

## 2024-03-27 ENCOUNTER — Emergency Department (HOSPITAL_BASED_OUTPATIENT_CLINIC_OR_DEPARTMENT_OTHER): Admission: EM | Admit: 2024-03-27 | Discharge: 2024-03-27 | Disposition: A | Discharge status: Home / Self Care

## 2024-03-27 ENCOUNTER — Encounter (HOSPITAL_BASED_OUTPATIENT_CLINIC_OR_DEPARTMENT_OTHER): Payer: Self-pay | Admitting: Emergency Medicine

## 2024-03-27 DIAGNOSIS — I10 Essential (primary) hypertension: Secondary | ICD-10-CM | POA: Diagnosis not present

## 2024-03-27 DIAGNOSIS — R1032 Left lower quadrant pain: Secondary | ICD-10-CM | POA: Diagnosis present

## 2024-03-27 DIAGNOSIS — Z7982 Long term (current) use of aspirin: Secondary | ICD-10-CM | POA: Diagnosis not present

## 2024-03-27 DIAGNOSIS — R110 Nausea: Secondary | ICD-10-CM | POA: Insufficient documentation

## 2024-03-27 DIAGNOSIS — Z79899 Other long term (current) drug therapy: Secondary | ICD-10-CM | POA: Diagnosis not present

## 2024-03-27 DIAGNOSIS — J45909 Unspecified asthma, uncomplicated: Secondary | ICD-10-CM | POA: Diagnosis not present

## 2024-03-27 DIAGNOSIS — Z794 Long term (current) use of insulin: Secondary | ICD-10-CM | POA: Insufficient documentation

## 2024-03-27 DIAGNOSIS — R748 Abnormal levels of other serum enzymes: Secondary | ICD-10-CM | POA: Diagnosis not present

## 2024-03-27 DIAGNOSIS — E119 Type 2 diabetes mellitus without complications: Secondary | ICD-10-CM | POA: Diagnosis not present

## 2024-03-27 DIAGNOSIS — R109 Unspecified abdominal pain: Secondary | ICD-10-CM

## 2024-03-27 DIAGNOSIS — D72829 Elevated white blood cell count, unspecified: Secondary | ICD-10-CM | POA: Insufficient documentation

## 2024-03-27 LAB — COMPREHENSIVE METABOLIC PANEL WITH GFR
ALT: 12 U/L (ref 0–44)
AST: 22 U/L (ref 15–41)
Albumin: 3.9 g/dL (ref 3.5–5.0)
Alkaline Phosphatase: 141 U/L — ABNORMAL HIGH (ref 38–126)
Anion gap: 13 (ref 5–15)
BUN: 10 mg/dL (ref 8–23)
CO2: 26 mmol/L (ref 22–32)
Calcium: 9.5 mg/dL (ref 8.9–10.3)
Chloride: 100 mmol/L (ref 98–111)
Creatinine, Ser: 0.67 mg/dL (ref 0.44–1.00)
GFR, Estimated: >60 mL/min (ref >60)
Glucose, Bld: 88 mg/dL (ref 70–99)
Potassium: 4.2 mmol/L (ref 3.5–5.1)
Sodium: 138 mmol/L (ref 135–145)
Total Bilirubin: 0.4 mg/dL (ref 0.0–1.2)
Total Protein: 7.2 g/dL (ref 6.5–8.1)

## 2024-03-27 LAB — CBC WITH DIFFERENTIAL/PLATELET
Abs Immature Granulocytes: 0.04 K/uL (ref 0.00–0.07)
Basophils Absolute: 0 K/uL (ref 0.0–0.1)
Basophils Relative: 0 %
Eosinophils Absolute: 0 K/uL (ref 0.0–0.5)
Eosinophils Relative: 0 %
HCT: 40.1 % (ref 36.0–46.0)
Hemoglobin: 13.9 g/dL (ref 12.0–15.0)
Immature Granulocytes: 0 %
Lymphocytes Relative: 16 %
Lymphs Abs: 1.9 K/uL (ref 0.7–4.0)
MCH: 29.1 pg (ref 26.0–34.0)
MCHC: 34.7 g/dL (ref 30.0–36.0)
MCV: 83.9 fL (ref 80.0–100.0)
Monocytes Absolute: 0.7 K/uL (ref 0.1–1.0)
Monocytes Relative: 6 %
Neutro Abs: 9.7 K/uL — ABNORMAL HIGH (ref 1.7–7.7)
Neutrophils Relative %: 78 %
Platelets: 368 K/uL (ref 150–400)
RBC: 4.78 MIL/uL (ref 3.87–5.11)
RDW: 12.4 % (ref 11.5–15.5)
WBC: 12.5 K/uL — ABNORMAL HIGH (ref 4.0–10.5)
nRBC: 0 % (ref 0.0–0.2)

## 2024-03-27 LAB — URINALYSIS, ROUTINE W REFLEX MICROSCOPIC
Bilirubin Urine: NEGATIVE
Glucose, UA: NEGATIVE mg/dL
Hgb urine dipstick: NEGATIVE
Ketones, ur: NEGATIVE mg/dL
Leukocytes,Ua: NEGATIVE
Nitrite: NEGATIVE
Protein, ur: NEGATIVE mg/dL
Specific Gravity, Urine: 1.01 (ref 1.005–1.030)
pH: 7 (ref 5.0–8.0)

## 2024-03-27 LAB — LIPASE, BLOOD: Lipase: 17 U/L (ref 11–51)

## 2024-03-27 MED ORDER — ONDANSETRON HCL 4 MG/2ML IJ SOLN
4.0000 mg | Freq: Once | INTRAMUSCULAR | Status: AC
  Administered 2024-03-27: 4 mg via INTRAVENOUS
  Filled 2024-03-27: qty 2

## 2024-03-27 MED ORDER — SODIUM CHLORIDE 0.9 % IV BOLUS
1000.0000 mL | Freq: Once | INTRAVENOUS | Status: AC
  Administered 2024-03-27: 1000 mL via INTRAVENOUS

## 2024-03-27 MED ORDER — ACETAMINOPHEN 325 MG PO TABS
650.0000 mg | ORAL_TABLET | Freq: Once | ORAL | Status: AC
  Administered 2024-03-27: 650 mg via ORAL
  Filled 2024-03-27: qty 2

## 2024-03-27 MED ORDER — ONDANSETRON 4 MG PO TBDP: 4.0000 mg | ORAL_TABLET | Freq: Three times a day (TID) | ORAL | 0 refills | Status: AC | PRN

## 2024-03-27 MED ORDER — DICYCLOMINE HCL 20 MG PO TABS: 20.0000 mg | ORAL_TABLET | Freq: Two times a day (BID) | ORAL | 0 refills | Status: AC

## 2024-03-27 MED ORDER — LIDOCAINE VISCOUS HCL 2 % MT SOLN
15.0000 mL | Freq: Once | OROMUCOSAL | Status: AC
  Administered 2024-03-27: 15 mL via ORAL
  Filled 2024-03-27: qty 15

## 2024-03-27 MED ORDER — ALUM & MAG HYDROXIDE-SIMETH 200-200-20 MG/5ML PO SUSP
30.0000 mL | Freq: Once | ORAL | Status: AC
  Administered 2024-03-27: 30 mL via ORAL
  Filled 2024-03-27: qty 30

## 2024-03-27 NOTE — ED Provider Notes (Signed)
 [place] [facility] Provider Note   CSN: [id number] Arrival date & time: 03/27/24  1254     Patient presents with: Abdominal Pain   Elizabeth Huber is a 63 y.o. female.   63 year old female presents today for concern of abdominal pain mostly in her lower abdomen.  Does not radiate anywhere.  No changes to her stool habits, without dysuria.  She states she gets this flareup of abdominal pain every so often.  She comes in for evaluation but typically has a reassuring workup and is discharged.  Has a primary care doctor.  Has not been seen by gastroenterologist.  Denies fever, emesis but does endorse some nausea.  No chest pain, shortness of breath.  She states she was able to tolerate some p.o. intake prior to coming in but was not able to eat as much as she normally would have been able to.  The history is provided by the patient. No language interpreter was used.       Prior to Admission medications   Medication Sig Start Date End Date Taking? Authorizing Provider  acetaminophen  (TYLENOL ) 500 MG tablet Take 500 mg by mouth every 6 (six) hours as needed for moderate pain or headache.     [provider]  aspirin  325 MG tablet Take 325 mg by mouth daily.    [provider]  atenolol  (TENORMIN ) 50 MG tablet Take 50 mg by mouth daily.    [provider]  atorvastatin  (LIPITOR) 40 MG tablet Take 40 mg by mouth daily. 06/09/23   [provider]  cyclobenzaprine  (FLEXERIL ) 10 MG tablet Take 1 tablet (10 mg total) by mouth 2 (two) times daily as needed for muscle spasms. Patient not taking: Reported on 11/09/2023 10/08/23   Victor Lynwood DASEN, PA-C  furosemide  (LASIX ) 40 MG tablet Take 1 tablet (40 mg total) by mouth daily. Patient taking differently: Take 40 mg by mouth 2 (two) times daily. 08/22/16   Ricky Fines, MD  gabapentin  (NEURONTIN ) 300 MG capsule Take 1 capsule (300 mg total) by mouth 3 (three) times daily. 11/09/23    Christine Rush, DPM  ibuprofen  (ADVIL ) 800 MG tablet Take 800 mg by mouth every 8 (eight) hours as needed. 10/26/20   [provider]  insulin  NPH-regular (NOVOLIN 70/30) (70-30) 100 UNIT/ML injection Inject 60 Units into the skin 2 (two) times daily with a meal.    [provider]  lidocaine  (LIDODERM ) 5 % Place 1 patch onto the skin daily. Remove & Discard patch within 12 hours or as directed by MD 10/08/23   Victor Lynwood DASEN, PA-C  loratadine  (CLARITIN ) 10 MG tablet Take 1 tablet (10 mg total) by mouth daily. 08/22/16   Ricky Fines, MD  losartan  (COZAAR ) 25 MG tablet Take 25 mg by mouth daily.    [provider]  omeprazole  (PRILOSEC) 20 MG capsule Take 20 mg by mouth daily.    [provider]    Allergies: Hydrocodone, Oxycodone , Amoxicillin-pot clavulanate, Exenatide, Metformin hcl, Morphine  and codeine, Tradjenta [linagliptin], and Vicodin [hydrocodone-acetaminophen ]    Review of Systems  Constitutional:  Negative for chills and fever.  Respiratory:  Negative for shortness of breath.   Cardiovascular:  Negative for chest pain.  Gastrointestinal:  Positive for abdominal pain and nausea. Negative for constipation, diarrhea and vomiting.  Genitourinary:  Negative for dysuria.  Neurological:  Negative for light-headedness.  All other systems reviewed and are negative.   Updated Vital Signs BP 130/68  Pulse 64   Temp 97.8 F (36.6 C)   Resp 16   Ht 5' 4 (1.626 m)   Wt (!) 148.3 kg   LMP 04/22/2011   SpO2 90%   BMI 56.13 kg/m   Physical Exam Vitals and nursing note reviewed.  Constitutional:      General: She is not in acute distress.    Appearance: Normal appearance. She is not ill-appearing.  HENT:     Head: Normocephalic and atraumatic.     Nose: Nose normal.  Eyes:     Conjunctiva/sclera: Conjunctivae normal.  Cardiovascular:     Rate and Rhythm: Normal rate and regular rhythm.  Pulmonary:     Effort: Pulmonary effort is normal.  No respiratory distress.  Abdominal:     General: There is no distension.     Palpations: Abdomen is soft.     Tenderness: There is no abdominal tenderness. There is no guarding.     Comments: No evidence of acute abdomen.  Musculoskeletal:        General: No deformity. Normal range of motion.     Cervical back: Normal range of motion.  Skin:    Findings: No rash.  Neurological:     Mental Status: She is alert.     (all labs ordered are listed, but only abnormal results are displayed) Labs Reviewed  COMPREHENSIVE METABOLIC PANEL WITH GFR - Abnormal; Notable for the following components:      Result Value   Alkaline Phosphatase 141 (*)    All other components within normal limits  CBC WITH DIFFERENTIAL/PLATELET - Abnormal; Notable for the following components:   WBC 12.5 (*)    Neutro Abs 9.7 (*)    All other components within normal limits  LIPASE, BLOOD  URINALYSIS, ROUTINE W REFLEX MICROSCOPIC    EKG: None  Radiology: No results found.   Procedures   Medications Ordered in the ED  ondansetron  (ZOFRAN ) injection 4 mg (4 mg Intravenous Given 03/27/24 1455)  sodium chloride  0.9 % bolus 1,000 mL (1,000 mLs Intravenous New Bag/Given 03/27/24 1457)  acetaminophen  (TYLENOL ) tablet 650 mg (650 mg Oral Given 03/27/24 1455)  alum & mag hydroxide-simeth (MAALOX/MYLANTA) 200-200-20 MG/5ML suspension 30 mL (30 mLs Oral Given 03/27/24 1454)    And  lidocaine  (XYLOCAINE ) 2 % viscous mouth solution 15 mL (15 mLs Oral Given 03/27/24 1454)                                    Medical Decision Making Amount and/or Complexity of Data Reviewed Labs: ordered.  Risk OTC drugs. Prescription drug management.   Medical Decision Making / ED Course   This patient presents to the ED for concern of abdominal pain, this involves an extensive number of treatment options, and is a complaint that carries with it a high risk of complications and morbidity.  The differential diagnosis includes  colitis, appendicitis, cholecystitis, diverticulitis, gastroenteritis, UTI, pyelonephritis  MDM: 63 year old female presents with concern of abdominal pain.  Started today.  Occasionally has this flareup of abdominal pain. Recently had a CT scan done within the last 2 weeks which was reassuring without any acute findings with similar symptoms.  Will obtain blood work, provide symptom management and reevaluate.  Blood work is overall reassuring.  CBC with mild leukocytosis with no significant left shift.  No anemia.  CMP with mildly elevated alk phos which is her baseline otherwise no acute finding.  Lipase within normal.  EKG without acute ischemic change No tenderness on exam. UA without evidence of UTI.  Patient discharged in stable condition.  Return precaution discussed.  Patient voices understanding and is in agreement with plan.   Additional history obtained: -Additional history obtained from chart review -External records from outside source obtained and reviewed including: Chart review including previous notes, labs, imaging, consultation notes   Lab Tests: -I ordered, reviewed, and interpreted labs.   The pertinent results include:   Labs Reviewed  COMPREHENSIVE METABOLIC PANEL WITH GFR - Abnormal; Notable for the following components:      Result Value   Alkaline Phosphatase 141 (*)    All other components within normal limits  CBC WITH DIFFERENTIAL/PLATELET - Abnormal; Notable for the following components:   WBC 12.5 (*)    Neutro Abs 9.7 (*)    All other components within normal limits  LIPASE, BLOOD  URINALYSIS, ROUTINE W REFLEX MICROSCOPIC      EKG  EKG Interpretation Date/Time:    Ventricular Rate:    PR Interval:    QRS Duration:    QT Interval:    QTC Calculation:   R Axis:      Text Interpretation:            Medicines ordered and prescription drug management: Meds ordered this encounter  Medications   ondansetron  (ZOFRAN ) injection 4 mg    sodium chloride  0.9 % bolus 1,000 mL   acetaminophen  (TYLENOL ) tablet 650 mg   AND Linked Order Group    alum & mag hydroxide-simeth (MAALOX/MYLANTA) 200-200-20 MG/5ML suspension 30 mL    lidocaine  (XYLOCAINE ) 2 % viscous mouth solution 15 mL    -I have reviewed the patients home medicines and have made adjustments as needed   Reevaluation: After the interventions noted above, I reevaluated the patient and found that they have :improved  Co morbidities that complicate the patient evaluation  Past Medical History:  Diagnosis Date   Angina    Asthma    Chronic bronchitis    have it whenever I have a cold   Diabetes mellitus type 2 in obese    Dysrhythmia 08/03/11   palpitations   GERD (gastroesophageal reflux disease)    Headache(784.0)    Herpes simplex    type 1 & 2   High cholesterol    Hypertension    Phlebitis    Shortness of breath on exertion       Dispostion: Discharged in stable condition.  Return precaution discussed.  Patient voices understanding and is in agreement with plan.  Final diagnoses:  Abdominal pain, unspecified abdominal location    ED Discharge Orders          Ordered    dicyclomine  (BENTYL ) 20 MG tablet  2 times daily        03/27/24 1601    ondansetron  (ZOFRAN -ODT) 4 MG disintegrating tablet  Every 8 hours PRN        03/27/24 1601               Hildegard Loge, PA-C 03/27/24 1603    Neysa Caron PARAS, DO 03/30/24 2033

## 2024-03-27 NOTE — ED Triage Notes (Signed)
 Pt c/o LLQ pain and nausea since this morning

## 2024-03-27 NOTE — ED Notes (Signed)
 Pt states she has no pain anymore and nausea is gone. Amjad, PA notified

## 2024-03-27 NOTE — Discharge Instructions (Signed)
 Your workup today was reassuring.  Blood work did not show any worrisome findings.  We discussed a CT scan but you recently had this and do not feel like it would be helpful especially since your symptoms are proved with medicine and your blood work was reassuring.  Please follow-up primary care doctor.  Have given you information for a gastroenterologist.  Return for any emergent symptoms. Have sent Zofran  for nausea and Bentyl  for pain into the pharmacy for you.

## 2024-05-13 ENCOUNTER — Emergency Department (HOSPITAL_BASED_OUTPATIENT_CLINIC_OR_DEPARTMENT_OTHER)

## 2024-05-13 ENCOUNTER — Emergency Department (HOSPITAL_BASED_OUTPATIENT_CLINIC_OR_DEPARTMENT_OTHER)
Admission: EM | Admit: 2024-05-13 | Discharge: 2024-05-13 | Disposition: A | Discharge status: Home / Self Care | Attending: Emergency Medicine | Admitting: Emergency Medicine

## 2024-05-13 ENCOUNTER — Encounter (HOSPITAL_BASED_OUTPATIENT_CLINIC_OR_DEPARTMENT_OTHER): Payer: Self-pay

## 2024-05-13 ENCOUNTER — Other Ambulatory Visit: Payer: Self-pay

## 2024-05-13 DIAGNOSIS — R519 Headache, unspecified: Secondary | ICD-10-CM | POA: Diagnosis not present

## 2024-05-13 DIAGNOSIS — E119 Type 2 diabetes mellitus without complications: Secondary | ICD-10-CM | POA: Diagnosis not present

## 2024-05-13 DIAGNOSIS — Z794 Long term (current) use of insulin: Secondary | ICD-10-CM | POA: Diagnosis not present

## 2024-05-13 DIAGNOSIS — R109 Unspecified abdominal pain: Secondary | ICD-10-CM | POA: Insufficient documentation

## 2024-05-13 DIAGNOSIS — I10 Essential (primary) hypertension: Secondary | ICD-10-CM | POA: Diagnosis not present

## 2024-05-13 DIAGNOSIS — Z7982 Long term (current) use of aspirin: Secondary | ICD-10-CM | POA: Insufficient documentation

## 2024-05-13 DIAGNOSIS — R0602 Shortness of breath: Secondary | ICD-10-CM | POA: Diagnosis present

## 2024-05-13 DIAGNOSIS — M545 Low back pain, unspecified: Secondary | ICD-10-CM | POA: Insufficient documentation

## 2024-05-13 LAB — URINALYSIS, ROUTINE W REFLEX MICROSCOPIC
Bilirubin Urine: NEGATIVE
Glucose, UA: NEGATIVE mg/dL
Hgb urine dipstick: NEGATIVE
Ketones, ur: NEGATIVE mg/dL
Leukocytes,Ua: NEGATIVE
Nitrite: NEGATIVE
Protein, ur: NEGATIVE mg/dL
Specific Gravity, Urine: 1.02 (ref 1.005–1.030)
pH: 5.5 (ref 5.0–8.0)

## 2024-05-13 LAB — CBC
HCT: 37.4 % (ref 36.0–46.0)
Hemoglobin: 13.1 g/dL (ref 12.0–15.0)
MCH: 29.5 pg (ref 26.0–34.0)
MCHC: 35 g/dL (ref 30.0–36.0)
MCV: 84.2 fL (ref 80.0–100.0)
Platelets: 286 K/uL (ref 150–400)
RBC: 4.44 MIL/uL (ref 3.87–5.11)
RDW: 12.4 % (ref 11.5–15.5)
WBC: 9.9 K/uL (ref 4.0–10.5)
nRBC: 0 % (ref 0.0–0.2)

## 2024-05-13 LAB — COMPREHENSIVE METABOLIC PANEL WITH GFR
ALT: 11 U/L (ref 0–44)
AST: 15 U/L (ref 15–41)
Albumin: 3.8 g/dL (ref 3.5–5.0)
Alkaline Phosphatase: 121 U/L (ref 38–126)
Anion gap: 11 (ref 5–15)
BUN: 17 mg/dL (ref 8–23)
CO2: 27 mmol/L (ref 22–32)
Calcium: 9.4 mg/dL (ref 8.9–10.3)
Chloride: 99 mmol/L (ref 98–111)
Creatinine, Ser: 0.63 mg/dL (ref 0.44–1.00)
GFR, Estimated: >60 mL/min
Glucose, Bld: 147 mg/dL — ABNORMAL HIGH (ref 70–99)
Potassium: 4.4 mmol/L (ref 3.5–5.1)
Sodium: 137 mmol/L (ref 135–145)
Total Bilirubin: 0.5 mg/dL (ref 0.0–1.2)
Total Protein: 6.7 g/dL (ref 6.5–8.1)

## 2024-05-13 LAB — RESP PANEL BY RT-PCR (RSV, FLU A&B, COVID)  RVPGX2
Influenza A by PCR: NEGATIVE
Influenza B by PCR: NEGATIVE
Resp Syncytial Virus by PCR: NEGATIVE
SARS Coronavirus 2 by RT PCR: NEGATIVE

## 2024-05-13 LAB — TROPONIN T, HIGH SENSITIVITY: Troponin T High Sensitivity: <15 ng/L (ref 0–19)

## 2024-05-13 LAB — PRO BRAIN NATRIURETIC PEPTIDE: Pro Brain Natriuretic Peptide: 153 pg/mL

## 2024-05-13 LAB — LIPASE, BLOOD: Lipase: 19 U/L (ref 11–51)

## 2024-05-13 MED ORDER — IOHEXOL 300 MG/ML  SOLN
100.0000 mL | Freq: Once | INTRAMUSCULAR | Status: AC | PRN
  Administered 2024-05-13: 100 mL via INTRAVENOUS

## 2024-05-13 MED ORDER — LIDOCAINE 5 % EX PTCH: 1.0000 | MEDICATED_PATCH | CUTANEOUS | 0 refills | Status: AC

## 2024-05-13 NOTE — ED Provider Notes (Signed)
 " [place] [facility] Provider Note   CSN: [id number] Arrival date & time: 05/13/24  9166     Patient presents with: Abdominal Pain   Elizabeth Huber is a 63 y.o. female.   Patient here with headache shortness of breath abdominal pain body aches foul-smelling urine for the last several days.  Nothing makes it worse or better.  Elizabeth Huber has high cholesterol chronic shortness of breath diabetes reflux hypertension.  Elizabeth Huber denies any chest pain.  Elizabeth Huber denies any weakness numbness tingling.  Denies any nausea vomit diarrhea.  The history is provided by the patient.       Prior to Admission medications  Medication Sig Start Date End Date Taking? Authorizing Provider  acetaminophen  (TYLENOL ) 500 MG tablet Take 500 mg by mouth every 6 (six) hours as needed for moderate pain or headache.     [provider]  aspirin  325 MG tablet Take 325 mg by mouth daily.    [provider]  atenolol  (TENORMIN ) 50 MG tablet Take 50 mg by mouth daily.    [provider]  atorvastatin  (LIPITOR) 40 MG tablet Take 40 mg by mouth daily. 06/09/23   [provider]  cyclobenzaprine  (FLEXERIL ) 10 MG tablet Take 1 tablet (10 mg total) by mouth 2 (two) times daily as needed for muscle spasms. Patient not taking: Reported on 11/09/2023 10/08/23   Victor Lynwood DASEN, PA-C  dicyclomine  (BENTYL ) 20 MG tablet Take 1 tablet (20 mg total) by mouth 2 (two) times daily. 03/27/24   Hildegard, Amjad, PA-C  furosemide  (LASIX ) 40 MG tablet Take 1 tablet (40 mg total) by mouth daily. Patient taking differently: Take 40 mg by mouth 2 (two) times daily. 08/22/16   Ricky Fines, MD  gabapentin  (NEURONTIN ) 300 MG capsule Take 1 capsule (300 mg total) by mouth 3 (three) times daily. 11/09/23   Christine Rush, DPM  ibuprofen  (ADVIL ) 800 MG tablet Take 800 mg by mouth every 8 (eight) hours as needed. 10/26/20   [provider]  insulin  NPH-regular (NOVOLIN 70/30) (70-30) 100  UNIT/ML injection Inject 60 Units into the skin 2 (two) times daily with a meal.    [provider]  lidocaine  (LIDODERM ) 5 % Place 1 patch onto the skin daily. Remove & Discard patch within 12 hours or as directed by MD 05/13/24   Ruthe Cornet, DO  loratadine  (CLARITIN ) 10 MG tablet Take 1 tablet (10 mg total) by mouth daily. 08/22/16   Ricky Fines, MD  losartan  (COZAAR ) 25 MG tablet Take 25 mg by mouth daily.    [provider]  omeprazole  (PRILOSEC) 20 MG capsule Take 20 mg by mouth daily.    [provider]  ondansetron  (ZOFRAN -ODT) 4 MG disintegrating tablet Take 1 tablet (4 mg total) by mouth every 8 (eight) hours as needed. 03/27/24   Hildegard Loge, PA-C    Allergies: Hydrocodone, Oxycodone , Amoxicillin-pot clavulanate, Exenatide, Metformin hcl, Morphine  and codeine, Tradjenta [linagliptin], and Vicodin [hydrocodone-acetaminophen ]    Review of Systems  Updated Vital Signs BP (!) 112/51 (BP Location: Right Arm)   Pulse 77   Temp 97.6 F (36.4 C)   Resp (!) 22   Wt (!) 146.1 kg   LMP 04/22/2011   SpO2 92%   BMI 55.27 kg/m   Physical Exam Vitals and nursing note reviewed.  Constitutional:      General: Elizabeth Huber is not in acute distress.    Appearance: Elizabeth Huber is well-developed. Elizabeth Huber is not ill-appearing.  HENT:  Head: Normocephalic and atraumatic.  Eyes:     Conjunctiva/sclera: Conjunctivae normal.  Cardiovascular:     Rate and Rhythm: Normal rate and regular rhythm.     Heart sounds: Normal heart sounds. No murmur heard. Pulmonary:     Effort: Pulmonary effort is normal. No respiratory distress.     Breath sounds: Normal breath sounds.  Abdominal:     Palpations: Abdomen is soft.     Tenderness: There is abdominal tenderness.  Musculoskeletal:        General: No swelling.     Cervical back: Neck supple.  Skin:    General: Skin is warm and dry.     Capillary Refill: Capillary refill takes less than 2 seconds.  Neurological:     Mental Status:  Elizabeth Huber is alert.  Psychiatric:        Mood and Affect: Mood normal.     (all labs ordered are listed, but only abnormal results are displayed) Labs Reviewed  COMPREHENSIVE METABOLIC PANEL WITH GFR - Abnormal; Notable for the following components:      Result Value   Glucose, Bld 147 (*)    All other components within normal limits  RESP PANEL BY RT-PCR (RSV, FLU A&B, COVID)  [id number]  LIPASE, BLOOD  CBC  URINALYSIS, ROUTINE W REFLEX MICROSCOPIC  PRO BRAIN NATRIURETIC PEPTIDE  TROPONIN T, HIGH SENSITIVITY    EKG: EKG Interpretation Date/Time:  Friday May 13 2024 09:04:06 EST Ventricular Rate:  72 PR Interval:  129 QRS Duration:  101 QT Interval:  399 QTC Calculation: 437 R Axis:   65  Text Interpretation: Sinus rhythm Confirmed by Ruthe Cornet [phone]) on 05/13/2024 10:14:41 AM  Radiology: CT ABDOMEN PELVIS W CONTRAST Result Date: 05/13/2024 EXAM: CT ABDOMEN AND PELVIS WITH CONTRAST 05/13/2024 10:26:42 AM TECHNIQUE: CT of the abdomen and pelvis was performed with the administration of intravenous contrast. Multiplanar reformatted images are provided for review. Automated exposure control, iterative reconstruction, and/or weight-based adjustment of the mA/kV was utilized to reduce the radiation dose to as low as reasonably achievable. 100 mL of iohexol  (OMNIPAQUE ) 300 MG/ML solution was administered intravenously. COMPARISON: CT abdomen and pelvis 03/11/2024 and earlier. CLINICAL HISTORY: 63 year old female with headache, abdominal pain, and strong smelling urine. FINDINGS: LOWER CHEST: Visible lower chest is negative. LIVER: The liver is unremarkable. GALLBLADDER AND BILE DUCTS: Gallbladder is unremarkable. No biliary ductal dilatation. SPLEEN: No acute abnormality. PANCREAS: No acute abnormality. ADRENAL GLANDS: No acute abnormality. KIDNEYS, URETERS AND BLADDER: Symmetric renal enhancement and normal contrast excretion. Diminutive ureters. No urinary calculus identified. No  stones in the kidneys or ureters. No hydronephrosis. No perinephric or periureteral stranding. Diminutive urinary bladder. Calcified pelvic phleboliths. GI AND BOWEL: Stomach demonstrates no acute abnormality. Decompressed stomach and duodenum. Nondilated small bowel. Redundant sigmoid colon at the pelvic inlet with mild large bowel diverticulosis in that region. No active inflammation. Redundant hepatic flexure with mild diverticulosis continuing into the right colon. No large bowel inflammation identified. Normal appendix on coronal image 97. There is no bowel obstruction. PERITONEUM AND RETROPERITONEUM: No ascites. No free air. VASCULATURE: Major arterial structures and portal venous system in the abdomen and pelvis are patent. Mild to moderate aortoiliac calcified atherosclerosis. Aorta is normal in caliber. LYMPH NODES: No lymphadenopathy. REPRODUCTIVE ORGANS: IUD with no adverse features. Left adnexa with mild asymmetric enlargement and heterogeneity but chronically stable. BONES AND SOFT TISSUES: Hyperostosis in the lower thoracic spine with visible occasional interbody ankylosis. Advanced chronic LUMBAR facet arthropathy with vacuum facet, underlying mild multilevel  grade 1 spondylolisthesis. No acute osseous abnormality. No focal soft tissue abnormality. IMPRESSION: 1. No acute or inflammatory process identified in the abdomen or pelvis. Electronically signed by: Helayne Hurst MD 05/13/2024 10:41 AM EST RP Workstation: [id number]   CT Head Wo Contrast Result Date: 05/13/2024 EXAM: CT HEAD WITHOUT CONTRAST 05/13/2024 10:26:42 AM TECHNIQUE: CT of the head was performed without the administration of intravenous contrast. Automated exposure control, iterative reconstruction, and/or weight based adjustment of the mA/kV was utilized to reduce the radiation dose to as low as reasonably achievable. COMPARISON: None available. CLINICAL HISTORY: 63 year old female with headache, abdominal pain, and strong smelling  urine. FINDINGS: BRAIN AND VENTRICLES: No acute hemorrhage. No evidence of acute infarct. No hydrocephalus. No extra-axial collection. No mass effect or midline shift. Brain volume and gray white differentiation within normal limits for age. No suspicious intracranial vascular hyperdensity. Mild calcified atherosclerosis at the skull base. ORBITS: Mild leftward gaze. No gaze deviation. SINUSES: Paranasal sinuses, tympanic cavities and mastoids well aerated. There is mild to moderate left ethmoid sinus mucosal thickening, small right maxillary retention cyst. No sinus fluid levels. SOFT TISSUES AND SKULL: No acute soft tissue abnormality. No skull fracture. IMPRESSION: 1. Normal for age non-contrast CT appearance of the brain. 2. Minor paranasal sinus disease. Electronically signed by: Helayne Hurst MD 05/13/2024 10:33 AM EST RP Workstation: [id number]   DG Chest Portable 1 View Result Date: 05/13/2024 CLINICAL DATA:  Shortness of breath EXAM: PORTABLE CHEST 1 VIEW COMPARISON:  Sep 30, 2023 FINDINGS: The heart size and mediastinal contours are within normal limits. Both lungs are clear. The visualized skeletal structures are unremarkable. IMPRESSION: No active disease. Electronically Signed   By: Lynwood Landy Raddle M.D.   On: 05/13/2024 09:08     Procedures   Medications Ordered in the ED  iohexol  (OMNIPAQUE ) 300 MG/ML solution 100 mL (100 mLs Intravenous Contrast Given 05/13/24 1007)                                    Medical Decision Making Amount and/or Complexity of Data Reviewed Labs: ordered. Radiology: ordered.  Risk Prescription drug management.   CRISTYN CROSSNO is here with multiple symptoms.  Headache shortness of breath domino pain body aches.  Differential diagnosis could be viral process but will evaluate for ACS volume overload intra-abdominal infection chest infection urine infection.  Vitals are unremarkable.  Elizabeth Huber does not appear to be any distress.  Clear breath sounds.  Will  reevaluate after blood work and imaging.  Lab work per my review and interpretation shows no significant leukocytosis anemia or electrolyte abnormality.  Troponin BNP unremarkable.  Urinalysis negative for infection.  Negative flu RSV test.  CT of the head and abdomen pelvis are unremarkable.  Chest x-ray with no pneumonia or volume overload.  Overall I think that this is muscle related process or maybe some other viral type process.  Continue supportive care at home.  Discharged in good condition.  Understands return precautions.  Had no concern for any other acute process.  No concern for PE dissection or any other emergency.  Understands return if symptoms worsen.  This chart was dictated using voice recognition software.  Despite best efforts to proofread,  errors can occur which can change the documentation meaning.      Final diagnoses:  Abdominal pain, unspecified abdominal location  Low back pain, unspecified back pain laterality, unspecified chronicity, unspecified whether sciatica present  Nonintractable headache, unspecified chronicity pattern, unspecified headache type    ED Discharge Orders          Ordered    lidocaine  (LIDODERM ) 5 %  Every 24 hours        05/13/24 1053               [place], DO 05/13/24 1054  "

## 2024-05-13 NOTE — Discharge Instructions (Signed)
 Continue Tylenol  at home for any pain and discomfort.  Recommend lidocaine  patches as well.

## 2024-05-13 NOTE — ED Triage Notes (Signed)
 Headache began last night  and left abdominal pain that radiates to back. Urine smells like ammonia . Intermittent SOB this week

## 2024-07-01 ENCOUNTER — Inpatient Hospital Stay: Payer: Self-pay

## 2024-07-01 ENCOUNTER — Ambulatory Visit: Admitting: Family
